# Patient Record
Sex: Female | Born: 1960 | Race: White | Hispanic: No | Marital: Married | Smoking: Never smoker
Health system: Southern US, Community
[De-identification: ages and names within clinical notes are randomized; demographics above are authoritative.]

## PROBLEM LIST (undated history)

## (undated) DIAGNOSIS — R002 Palpitations: Secondary | ICD-10-CM

## (undated) DIAGNOSIS — F419 Anxiety disorder, unspecified: Secondary | ICD-10-CM

## (undated) DIAGNOSIS — K219 Gastro-esophageal reflux disease without esophagitis: Secondary | ICD-10-CM

## (undated) DIAGNOSIS — M51369 Other intervertebral disc degeneration, lumbar region without mention of lumbar back pain or lower extremity pain: Secondary | ICD-10-CM

## (undated) DIAGNOSIS — M6530 Trigger finger, unspecified finger: Secondary | ICD-10-CM

## (undated) DIAGNOSIS — M5136 Other intervertebral disc degeneration, lumbar region: Secondary | ICD-10-CM

## (undated) DIAGNOSIS — G4730 Sleep apnea, unspecified: Secondary | ICD-10-CM

## (undated) DIAGNOSIS — E669 Obesity, unspecified: Secondary | ICD-10-CM

## (undated) DIAGNOSIS — E039 Hypothyroidism, unspecified: Secondary | ICD-10-CM

## (undated) DIAGNOSIS — F329 Major depressive disorder, single episode, unspecified: Secondary | ICD-10-CM

## (undated) DIAGNOSIS — R9431 Abnormal electrocardiogram [ECG] [EKG]: Secondary | ICD-10-CM

## (undated) DIAGNOSIS — F32A Depression, unspecified: Secondary | ICD-10-CM

## (undated) DIAGNOSIS — M5416 Radiculopathy, lumbar region: Secondary | ICD-10-CM

## (undated) DIAGNOSIS — Z9889 Other specified postprocedural states: Secondary | ICD-10-CM

## (undated) DIAGNOSIS — E785 Hyperlipidemia, unspecified: Secondary | ICD-10-CM

## (undated) DIAGNOSIS — K581 Irritable bowel syndrome with constipation: Secondary | ICD-10-CM

## (undated) HISTORY — PX: BREAST BIOPSY: SHX20

## (undated) HISTORY — DX: Hypothyroidism, unspecified: E03.9

## (undated) HISTORY — PX: COLONOSCOPY: SHX174

## (undated) HISTORY — DX: Other specified postprocedural states: Z98.890

## (undated) HISTORY — DX: Trigger finger, unspecified finger: M65.30

## (undated) HISTORY — DX: Depression, unspecified: F32.A

## (undated) HISTORY — PX: DILATION AND CURETTAGE OF UTERUS: SHX78

## (undated) HISTORY — PX: CHOLECYSTECTOMY: SHX55

## (undated) HISTORY — DX: Gastro-esophageal reflux disease without esophagitis: K21.9

## (undated) HISTORY — DX: Major depressive disorder, single episode, unspecified: F32.9

---
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
## 2004-12-25 ENCOUNTER — Ambulatory Visit: Payer: Self-pay | Admitting: Internal Medicine

## 2005-04-15 ENCOUNTER — Ambulatory Visit: Payer: Self-pay | Admitting: Internal Medicine

## 2006-04-22 ENCOUNTER — Ambulatory Visit: Payer: Self-pay | Admitting: Internal Medicine

## 2006-09-16 ENCOUNTER — Ambulatory Visit: Payer: Self-pay

## 2006-09-21 HISTORY — PX: LUMBAR DISC SURGERY: SHX700

## 2006-09-21 HISTORY — PX: BACK SURGERY: SHX140

## 2007-05-19 ENCOUNTER — Ambulatory Visit: Payer: Self-pay | Admitting: Unknown Physician Specialty

## 2007-09-22 HISTORY — PX: BLADDER SUSPENSION: SHX72

## 2007-09-29 ENCOUNTER — Ambulatory Visit: Payer: Self-pay | Admitting: Unknown Physician Specialty

## 2008-05-22 ENCOUNTER — Ambulatory Visit: Payer: Self-pay | Admitting: Unknown Physician Specialty

## 2008-07-10 ENCOUNTER — Ambulatory Visit: Payer: Self-pay | Admitting: Internal Medicine

## 2008-11-01 ENCOUNTER — Ambulatory Visit: Payer: Self-pay | Admitting: Otolaryngology

## 2009-06-18 ENCOUNTER — Ambulatory Visit: Payer: Self-pay | Admitting: Unknown Physician Specialty

## 2009-12-03 ENCOUNTER — Ambulatory Visit: Payer: Self-pay | Admitting: Internal Medicine

## 2010-08-06 ENCOUNTER — Ambulatory Visit: Payer: Self-pay | Admitting: Unknown Physician Specialty

## 2010-10-14 ENCOUNTER — Ambulatory Visit: Payer: Self-pay

## 2011-03-24 ENCOUNTER — Ambulatory Visit: Payer: Self-pay

## 2011-04-25 ENCOUNTER — Ambulatory Visit: Payer: Self-pay | Admitting: Unknown Physician Specialty

## 2011-09-02 ENCOUNTER — Ambulatory Visit: Payer: Self-pay | Admitting: Unknown Physician Specialty

## 2011-11-05 ENCOUNTER — Ambulatory Visit: Payer: Self-pay | Admitting: Otolaryngology

## 2011-11-05 LAB — PREGNANCY, URINE: Pregnancy Test, Urine: NEGATIVE m[IU]/mL

## 2011-11-10 LAB — PATHOLOGY REPORT

## 2011-11-11 LAB — WOUND CULTURE

## 2012-09-12 ENCOUNTER — Ambulatory Visit: Payer: Self-pay | Admitting: Physician Assistant

## 2012-09-21 HISTORY — PX: JOINT REPLACEMENT: SHX530

## 2012-09-21 HISTORY — PX: KNEE SURGERY: SHX244

## 2012-11-17 ENCOUNTER — Ambulatory Visit: Payer: Self-pay | Admitting: Unknown Physician Specialty

## 2012-12-22 ENCOUNTER — Encounter: Payer: Self-pay | Admitting: Unknown Physician Specialty

## 2013-01-19 ENCOUNTER — Encounter: Payer: Self-pay | Admitting: Unknown Physician Specialty

## 2013-06-06 ENCOUNTER — Ambulatory Visit: Payer: Self-pay | Admitting: Gastroenterology

## 2014-02-27 DIAGNOSIS — S8000XA Contusion of unspecified knee, initial encounter: Secondary | ICD-10-CM | POA: Insufficient documentation

## 2014-06-19 DIAGNOSIS — M5136 Other intervertebral disc degeneration, lumbar region: Secondary | ICD-10-CM | POA: Insufficient documentation

## 2014-09-21 HISTORY — PX: HIATAL HERNIA REPAIR: SHX195

## 2014-09-21 HISTORY — PX: HERNIA REPAIR: SHX51

## 2015-01-13 NOTE — Op Note (Signed)
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   POSTOPERATIVE DIAGNOSIS:  Right chronic frontal sinusitis status post two prior sinus surgeries.   PROCEDURES:  1. Image-guided sinus surgery (Stryker navigation).  2. Revision right frontal sinusotomy with tissue removal.   SURGEON: Janalee Dane, MD   DESCRIPTION OF PROCEDURE: The image-guided sinus surgery system mask was attached and registration was carried out in the standard fashion using the appropriate fiduciary points. Calibration of the system was confirmed and extensive review of the CT scan in all three dimensions preoperatively and intraoperatively was carried out.  Each instrument was registered and confirmed for anatomic accuracy.  The right frontal recess was contracted. Examination was carried out on the left side. This was wide open and in good condition. There was a small synechia at the anterior aspect of the left frontal recess. This was divided. Attention was directed to the right side where the right frontal recess was opened with multiple instrumentation. The frontal recess was completely opened and care was taken to avoid damage to the skull base, lamina papyracea, and fovea ethmoidalis. No CSF leak was identified. Less than 1 unit of Surgiflo was used and temporary Telfa pledgets were placed. The patient was returned to anesthesia, allowed to emerge from anesthesia in the operating room, and taken to the recovery room in stable condition. There were no complications. Estimated blood loss less than 10 mL.  ____________________________ J. Nadeen Landau, MD [id number] D: 11/05/2011 18:21:29 ET T: 11/06/2011 10:13:25 ET JOB#: [id number]  cc: Janalee Dane, MD, <Dictator> Nicholos Johns MD ELECTRONICALLY SIGNED 11/06/2011 11:24

## 2015-05-08 ENCOUNTER — Other Ambulatory Visit: Payer: Self-pay

## 2015-05-08 DIAGNOSIS — K2100 Gastro-esophageal reflux disease with esophagitis, without bleeding: Secondary | ICD-10-CM

## 2015-06-12 DIAGNOSIS — F419 Anxiety disorder, unspecified: Secondary | ICD-10-CM | POA: Insufficient documentation

## 2015-06-12 DIAGNOSIS — M2550 Pain in unspecified joint: Secondary | ICD-10-CM | POA: Insufficient documentation

## 2015-06-13 ENCOUNTER — Other Ambulatory Visit: Payer: Self-pay | Admitting: Bariatrics

## 2015-06-19 ENCOUNTER — Other Ambulatory Visit: Payer: Self-pay | Admitting: Bariatrics

## 2015-06-20 ENCOUNTER — Ambulatory Visit
Admission: RE | Admit: 2015-06-20 | Discharge: 2015-06-20 | Disposition: A | Discharge status: Home / Self Care | Payer: BC Managed Care – PPO | Source: Ambulatory Visit | Attending: Bariatrics | Admitting: Bariatrics

## 2015-06-20 DIAGNOSIS — K219 Gastro-esophageal reflux disease without esophagitis: Secondary | ICD-10-CM | POA: Insufficient documentation

## 2015-06-20 DIAGNOSIS — K449 Diaphragmatic hernia without obstruction or gangrene: Secondary | ICD-10-CM | POA: Insufficient documentation

## 2015-06-20 DIAGNOSIS — Z01818 Encounter for other preprocedural examination: Secondary | ICD-10-CM

## 2015-06-20 LAB — CBC
HEMATOCRIT: 40.8 % (ref 35.0–47.0)
HEMOGLOBIN: 13.8 g/dL (ref 12.0–16.0)
MCH: 31.6 pg (ref 26.0–34.0)
MCHC: 33.8 g/dL (ref 32.0–36.0)
MCV: 93.5 fL (ref 80.0–100.0)
Platelets: 265 10*3/uL (ref 150–440)
RBC: 4.36 MIL/uL (ref 3.80–5.20)
RDW: 13 % (ref 11.5–14.5)
WBC: 4.7 10*3/uL (ref 3.6–11.0)

## 2015-06-20 LAB — TSH: TSH: 1.87 u[IU]/mL (ref 0.350–4.500)

## 2015-06-20 LAB — VITAMIN B12: VITAMIN B 12: 475 pg/mL (ref 180–914)

## 2015-06-20 LAB — PREALBUMIN: PREALBUMIN: 21.7 mg/dL (ref 18–38)

## 2015-06-20 LAB — DIFFERENTIAL
BASOS ABS: 0 10*3/uL (ref 0–0.1)
Basophils Relative: 1 %
EOS PCT: 1 %
Eosinophils Absolute: 0 10*3/uL (ref 0–0.7)
LYMPHS PCT: 39 %
Lymphs Abs: 1.8 10*3/uL (ref 1.0–3.6)
Monocytes Absolute: 0.3 10*3/uL (ref 0.2–0.9)
Monocytes Relative: 7 %
NEUTROS ABS: 2.5 10*3/uL (ref 1.4–6.5)
NEUTROS PCT: 52 %

## 2015-06-20 LAB — COMPREHENSIVE METABOLIC PANEL
ALBUMIN: 3.8 g/dL (ref 3.5–5.0)
ALT: 14 U/L (ref 14–54)
ANION GAP: 8 (ref 5–15)
AST: 24 U/L (ref 15–41)
Alkaline Phosphatase: 51 U/L (ref 38–126)
BUN: 14 mg/dL (ref 6–20)
CHLORIDE: 104 mmol/L (ref 101–111)
CO2: 24 mmol/L (ref 22–32)
Calcium: 8.8 mg/dL — ABNORMAL LOW (ref 8.9–10.3)
Creatinine, Ser: 1 mg/dL (ref 0.44–1.00)
GFR calc Af Amer: >60 mL/min (ref >60)
GFR calc non Af Amer: >60 mL/min (ref >60)
GLUCOSE: 94 mg/dL (ref 65–99)
POTASSIUM: 4.1 mmol/L (ref 3.5–5.1)
SODIUM: 136 mmol/L (ref 135–145)
Total Bilirubin: 0.7 mg/dL (ref 0.3–1.2)
Total Protein: 7.2 g/dL (ref 6.5–8.1)

## 2015-06-20 LAB — HEMOGLOBIN A1C: Hgb A1c MFr Bld: 5.5 % (ref 4.0–6.0)

## 2015-06-20 LAB — PROTIME-INR
INR: 1.03
PROTHROMBIN TIME: 13.7 s (ref 11.4–15.0)

## 2015-06-20 LAB — MAGNESIUM: Magnesium: 2.1 mg/dL (ref 1.7–2.4)

## 2015-06-20 LAB — FERRITIN: Ferritin: 42 ng/mL (ref 11–307)

## 2015-06-20 LAB — IRON: IRON: 71 ug/dL (ref 28–170)

## 2015-06-20 LAB — APTT: aPTT: 30 seconds (ref 24–36)

## 2015-06-20 LAB — FOLATE: FOLATE: 18.6 ng/mL (ref >5.9)

## 2015-06-21 ENCOUNTER — Ambulatory Visit: Payer: BC Managed Care – PPO | Attending: Neurology

## 2015-06-21 DIAGNOSIS — G4733 Obstructive sleep apnea (adult) (pediatric): Secondary | ICD-10-CM | POA: Insufficient documentation

## 2015-06-21 DIAGNOSIS — G4761 Periodic limb movement disorder: Secondary | ICD-10-CM | POA: Insufficient documentation

## 2015-06-21 LAB — H. PYLORI ANTIBODY, IGG

## 2015-06-22 LAB — PTH, INTACT AND CALCIUM
CALCIUM TOTAL (PTH): 9.2 mg/dL (ref 8.7–10.2)
PTH: 28 pg/mL (ref 15–65)

## 2015-06-22 LAB — VITAMIN D 25 HYDROXY (VIT D DEFICIENCY, FRACTURES): Vit D, 25-Hydroxy: 30.9 ng/mL (ref 30.0–100.0)

## 2015-06-22 LAB — VITAMIN B1: Vitamin B1 (Thiamine): 137.3 nmol/L (ref 66.5–200.0)

## 2015-06-22 LAB — COPPER, SERUM: Copper: 127 ug/dL (ref 72–166)

## 2015-06-22 LAB — VITAMIN A: Vitamin A (Retinoic Acid): 56 ug/dL (ref 20–65)

## 2015-06-22 LAB — ZINC: ZINC: 74 ug/dL (ref 56–134)

## 2015-06-22 LAB — VITAMIN E: ALPHA-TOCOPHEROL: 11.9 mg/L (ref 5.3–16.8)

## 2015-06-23 LAB — MISC LABCORP TEST (SEND OUT): LABCORP TEST CODE: 121200

## 2015-07-03 ENCOUNTER — Ambulatory Visit: Payer: BC Managed Care – PPO | Attending: Neurology

## 2015-07-03 DIAGNOSIS — G4733 Obstructive sleep apnea (adult) (pediatric): Secondary | ICD-10-CM | POA: Insufficient documentation

## 2015-07-15 DIAGNOSIS — R9431 Abnormal electrocardiogram [ECG] [EKG]: Secondary | ICD-10-CM | POA: Insufficient documentation

## 2015-07-29 DIAGNOSIS — R0602 Shortness of breath: Secondary | ICD-10-CM | POA: Insufficient documentation

## 2015-07-30 ENCOUNTER — Other Ambulatory Visit: Payer: Self-pay | Admitting: Physical Medicine and Rehabilitation

## 2015-07-30 ENCOUNTER — Ambulatory Visit
Admission: RE | Admit: 2015-07-30 | Discharge: 2015-07-30 | Disposition: A | Discharge status: Home / Self Care | Payer: BC Managed Care – PPO | Source: Ambulatory Visit | Attending: Physical Medicine and Rehabilitation | Admitting: Physical Medicine and Rehabilitation

## 2015-07-30 DIAGNOSIS — M4806 Spinal stenosis, lumbar region: Secondary | ICD-10-CM | POA: Insufficient documentation

## 2015-07-30 DIAGNOSIS — R39198 Other difficulties with micturition: Secondary | ICD-10-CM

## 2015-07-30 DIAGNOSIS — M47896 Other spondylosis, lumbar region: Secondary | ICD-10-CM | POA: Diagnosis not present

## 2015-07-30 DIAGNOSIS — M5127 Other intervertebral disc displacement, lumbosacral region: Secondary | ICD-10-CM | POA: Diagnosis not present

## 2015-07-30 DIAGNOSIS — M5416 Radiculopathy, lumbar region: Secondary | ICD-10-CM

## 2015-07-30 DIAGNOSIS — M5186 Other intervertebral disc disorders, lumbar region: Secondary | ICD-10-CM | POA: Insufficient documentation

## 2015-08-12 DIAGNOSIS — M5416 Radiculopathy, lumbar region: Secondary | ICD-10-CM | POA: Insufficient documentation

## 2015-08-23 HISTORY — PX: GASTRIC RESTRICTION SURGERY: SHX653

## 2015-10-04 ENCOUNTER — Other Ambulatory Visit: Payer: Self-pay | Admitting: Unknown Physician Specialty

## 2015-10-04 ENCOUNTER — Other Ambulatory Visit: Payer: Self-pay | Admitting: *Deleted

## 2015-10-04 ENCOUNTER — Inpatient Hospital Stay
Admission: RE | Admit: 2015-10-04 | Discharge: 2015-10-04 | Disposition: A | Discharge status: Home / Self Care | Payer: Self-pay | Source: Ambulatory Visit | Attending: *Deleted | Admitting: *Deleted

## 2015-10-04 DIAGNOSIS — Z9289 Personal history of other medical treatment: Secondary | ICD-10-CM

## 2015-10-04 DIAGNOSIS — R928 Other abnormal and inconclusive findings on diagnostic imaging of breast: Secondary | ICD-10-CM

## 2015-10-17 ENCOUNTER — Ambulatory Visit
Admission: RE | Admit: 2015-10-17 | Discharge: 2015-10-17 | Disposition: A | Discharge status: Home / Self Care | Payer: BC Managed Care – PPO | Source: Ambulatory Visit | Attending: Unknown Physician Specialty | Admitting: Unknown Physician Specialty

## 2015-10-17 ENCOUNTER — Other Ambulatory Visit: Payer: Self-pay | Admitting: Unknown Physician Specialty

## 2015-10-17 DIAGNOSIS — R928 Other abnormal and inconclusive findings on diagnostic imaging of breast: Secondary | ICD-10-CM

## 2015-10-17 DIAGNOSIS — R590 Localized enlarged lymph nodes: Secondary | ICD-10-CM | POA: Diagnosis not present

## 2015-10-25 ENCOUNTER — Encounter: Payer: Self-pay | Admitting: General Surgery

## 2015-10-28 ENCOUNTER — Ambulatory Visit: Payer: BC Managed Care – PPO

## 2015-10-28 ENCOUNTER — Ambulatory Visit (INDEPENDENT_AMBULATORY_CARE_PROVIDER_SITE_OTHER): Payer: BC Managed Care – PPO | Admitting: General Surgery

## 2015-10-28 ENCOUNTER — Other Ambulatory Visit: Payer: BC Managed Care – PPO

## 2015-10-28 ENCOUNTER — Other Ambulatory Visit: Payer: Self-pay | Admitting: Unknown Physician Specialty

## 2015-10-28 ENCOUNTER — Encounter: Payer: Self-pay | Admitting: General Surgery

## 2015-10-28 VITALS — BP 128/82 | HR 76 | Resp 12 | Ht 64.0 in | Wt 248.0 lb

## 2015-10-28 DIAGNOSIS — N6320 Unspecified lump in the left breast, unspecified quadrant: Secondary | ICD-10-CM

## 2015-10-28 DIAGNOSIS — N63 Unspecified lump in breast: Secondary | ICD-10-CM | POA: Diagnosis not present

## 2015-10-28 DIAGNOSIS — R590 Localized enlarged lymph nodes: Secondary | ICD-10-CM | POA: Diagnosis not present

## 2015-10-28 DIAGNOSIS — R928 Other abnormal and inconclusive findings on diagnostic imaging of breast: Secondary | ICD-10-CM

## 2015-10-28 DIAGNOSIS — N6310 Unspecified lump in the right breast, unspecified quadrant: Secondary | ICD-10-CM

## 2015-10-28 HISTORY — PX: BREAST BIOPSY: SHX20

## 2015-10-28 NOTE — Progress Notes (Signed)
Patient ID: Sherry Evans, female   DOB: [birth year], 55 y.o.   MRN: [id number]  Chief Complaint  Patient presents with  . OTHER    mammogram    HPI Sherry Evans is a 55 y.o. female. who presents for a breast evaluation. The most recent mammogram was done on 10/04/15 and added views left breast ultrasound on 10/17/15.  The patient denies any history of trauma or any breast discomfort. Patient does not  perform regular self breast checks and gets regular mammograms done. Family history of breast cancer.  50 pound weight loss since bariatric surgery completed in December 2016.  She is a Oncologist.  HPI  Past Medical History  Diagnosis Date  . Hypothyroidism   . History of total bilateral knee replacement   . History of sinus surgery     3 times  . GERD (gastroesophageal reflux disease)   . Depression     Past Surgical History  Procedure Laterality Date  . Bladder suspension  2009  . Back surgery  2008  . Knee surgery  2014  . Cholecystectomy    . Gastric restriction surgery  08/23/15  . Hernia repair  2016    hiatal  . Adnoids removed  1969  . Breast biopsy Left 1990's    benign    Family History  Problem Relation Age of Onset  . Breast cancer Maternal Aunt     great aunts. 70-80  . Breast cancer Maternal Grandmother 9    metatastic to breast  . Breast cancer Paternal Grandmother     70's    Social History Social History  Substance Use Topics  . Smoking status: Never Smoker   . Smokeless tobacco: None  . Alcohol Use: No    Allergies  Allergen Reactions  . Azithromycin Nausea And Vomiting  . Sulfa Antibiotics Rash    Current Outpatient Prescriptions  Medication Sig Dispense Refill  . Calcium Carbonate-Vitamin D 600-400 MG-UNIT per tablet Take by mouth. Reported on 10/28/2015    . docusate (COLACE) 50 MG/5ML liquid Take by mouth.    . gabapentin (NEURONTIN) 600 MG tablet TAKE 2 TABLETS BY MOUTH WITH EVENING MEAL    . levothyroxine (SYNTHROID,  LEVOTHROID) 100 MCG tablet Take by mouth.    . montelukast (SINGULAIR) 10 MG tablet Take by mouth.    . RABEprazole (ACIPHEX) 20 MG tablet Take 20 mg by mouth 2 times daily at 12 noon and 4 pm.    . sertraline (ZOLOFT) 25 MG tablet Take by mouth.     No current facility-administered medications for this visit.    Review of Systems Review of Systems  Constitutional: Negative.   Respiratory: Negative.   Cardiovascular: Negative.     Blood pressure 128/82, pulse 76, resp. rate 12, height 5\' 4"  (1.626 m), weight 248 lb (112.492 kg), last menstrual period 10/13/2015.  Physical Exam Physical Exam  Constitutional: She is oriented to person, place, and time. She appears well-developed and well-nourished.  Eyes: Conjunctivae are normal. No scleral icterus.  Neck: Neck supple.  Cardiovascular: Normal rate, regular rhythm and normal heart sounds.   Pulmonary/Chest: Effort normal and breath sounds normal. Right breast exhibits no inverted nipple, no mass, no nipple discharge, no skin change and no tenderness. Left breast exhibits no inverted nipple, no nipple discharge, no skin change and no tenderness.    Abdominal: Soft. Normal appearance and bowel sounds are normal. There is no hepatomegaly. There is no tenderness.  Lymphadenopathy:    She  has no cervical adenopathy.    She has no axillary adenopathy.       Right: No inguinal adenopathy present.       Left: No inguinal adenopathy present.  Neurological: She is alert and oriented to person, place, and time.  Skin: Skin is warm and dry.    Data Reviewed Mammograms completed through [facility] from December 2014 to January 2017 were reviewed. A small nodules identified high in the upper-outer quadrant of the left breast that has gradually increased in size from 8 x 8 cm in 2014-10 x 11 cm in 2017. BI-RADS-0.  Diagnostic mammogram dated 10/17/2015 showed a persistent mass in the upper-outer quadrant of the left breast. Ultrasound  showed a 6 x 8 x 10 cm in tremor mammary lymph node. Multiple enlarged lymph nodes in the axilla were reported. BI-RADS-4.  10/03/2015 GYN note from Bunnie Pion, M.D. reviewed.  08/24/2000 stereotactic biopsy of the left breast showed fibrocystic changes.  Ultrasound examination of the upper-outer quadrant of the right breast showed a chain is small isoechoic nodules consistent with fat lobules measuring up to 0.67 cm in diameter. No architectural distortion. BI-RADS-2.  Ultrasound examination of the left breast showed a well-defined nodule in the 2:00 position, 14 cm from the nipple. This measured 0.8 x 0.9 x 0.9 cm. Some posterior acoustic enhancement was noted. In the axilla a moderately large lymph node measuring 0.8 x 1.4 x 2.2 cm was appreciated.  The patient was amenable to core biopsy and FNA sampling. The axillary node was sampled with a 22-gauge needle with multiple passes through the lesion in multiple areas with slides prepared for cytology.  The breast nodule was approached laterally after the instillation of 10 mL of 0.5% Xylocaine with 0.25% Marcaine with 1-200,000 of epinephrine. A 10-gauge Encor biopsy device was used and the area was removed in its entirety. Post biopsy a clip was placed without incident. A roll was placed behind the left shoulder to provide better access to the axilla(designated post roll on images). Skin defect was closed with benzoin and Steri-Strips followed by Telfa and Tegaderm dressing.    Assessment    Enlarging intramammary node/mass left breast area moderate lymphadenopathy without clear etiology.    Plan    The patient tolerated the procedure well. She'll be contacted when pathology results are available.    PCP:  Radford Pax This information has been scribed by Gaspar Cola CMA.    Robert Bellow 10/29/2015, 5:03 PM

## 2015-10-28 NOTE — Patient Instructions (Signed)

## 2015-10-29 ENCOUNTER — Other Ambulatory Visit: Payer: BC Managed Care – PPO

## 2015-10-29 DIAGNOSIS — R590 Localized enlarged lymph nodes: Secondary | ICD-10-CM | POA: Insufficient documentation

## 2015-10-29 DIAGNOSIS — N6320 Unspecified lump in the left breast, unspecified quadrant: Secondary | ICD-10-CM | POA: Insufficient documentation

## 2015-10-29 DIAGNOSIS — N6310 Unspecified lump in the right breast, unspecified quadrant: Secondary | ICD-10-CM | POA: Insufficient documentation

## 2015-10-30 ENCOUNTER — Telehealth: Payer: Self-pay | Admitting: General Surgery

## 2015-10-30 ENCOUNTER — Other Ambulatory Visit: Payer: Self-pay

## 2015-10-30 DIAGNOSIS — D479 Neoplasm of uncertain behavior of lymphoid, hematopoietic and related tissue, unspecified: Secondary | ICD-10-CM

## 2015-10-30 NOTE — Telephone Encounter (Signed)
The patient was notified that the biopsy of the left intramammary lymph node completed on February 6 did not show evidence of malignancy. No breast pathology whatsoever. Possibly a component of mild acute inflammation. FNA of the prominent axillary lymph node was negative for metastatic disease.  The pathologist suggested that evaluation by hematology for lymphoproliferative diseases may be appropriate. This information was passed out of the patient.  We'll arrange an appointment with Lorenda Cahill, M.D. from hematology/oncology for her assessment.

## 2015-10-30 NOTE — Progress Notes (Signed)
Patient is scheduled to see Dr Mike Gip at [facility] on 11/08/15 at 8:30 am. Patient is aware of date, time, and instructions.

## 2015-11-05 ENCOUNTER — Telehealth: Payer: Self-pay | Admitting: *Deleted

## 2015-11-05 NOTE — Telephone Encounter (Signed)
-----   Message from Carson Myrtle, RN sent at 10/29/2015  8:20 AM EST ----- Phone follow up

## 2015-11-05 NOTE — Telephone Encounter (Signed)
Minimal bruising. Continue self breast exams. Call office for any new breast issues or concerns.

## 2015-11-05 NOTE — Telephone Encounter (Signed)
Patient states the area is clean and steri stirps have fell off.

## 2015-11-08 ENCOUNTER — Inpatient Hospital Stay: Payer: BC Managed Care – PPO

## 2015-11-08 ENCOUNTER — Inpatient Hospital Stay: Payer: BC Managed Care – PPO | Attending: Hematology and Oncology | Admitting: Hematology and Oncology

## 2015-11-08 VITALS — BP 121/87 | HR 89 | Temp 97.1°F | Wt 246.9 lb

## 2015-11-08 DIAGNOSIS — N63 Unspecified lump in breast: Secondary | ICD-10-CM | POA: Insufficient documentation

## 2015-11-08 DIAGNOSIS — K219 Gastro-esophageal reflux disease without esophagitis: Secondary | ICD-10-CM | POA: Diagnosis not present

## 2015-11-08 DIAGNOSIS — E039 Hypothyroidism, unspecified: Secondary | ICD-10-CM | POA: Diagnosis not present

## 2015-11-08 DIAGNOSIS — N926 Irregular menstruation, unspecified: Secondary | ICD-10-CM | POA: Insufficient documentation

## 2015-11-08 DIAGNOSIS — Z9889 Other specified postprocedural states: Secondary | ICD-10-CM | POA: Insufficient documentation

## 2015-11-08 DIAGNOSIS — R61 Generalized hyperhidrosis: Secondary | ICD-10-CM

## 2015-11-08 DIAGNOSIS — Z803 Family history of malignant neoplasm of breast: Secondary | ICD-10-CM | POA: Insufficient documentation

## 2015-11-08 DIAGNOSIS — Z79899 Other long term (current) drug therapy: Secondary | ICD-10-CM | POA: Diagnosis not present

## 2015-11-08 DIAGNOSIS — Z9884 Bariatric surgery status: Secondary | ICD-10-CM | POA: Diagnosis not present

## 2015-11-08 DIAGNOSIS — R634 Abnormal weight loss: Secondary | ICD-10-CM

## 2015-11-08 DIAGNOSIS — R590 Localized enlarged lymph nodes: Secondary | ICD-10-CM

## 2015-11-08 LAB — CBC WITH DIFFERENTIAL/PLATELET
Basophils Absolute: 0.1 10*3/uL (ref 0–0.1)
Basophils Relative: 1 %
Eosinophils Absolute: 0.1 10*3/uL (ref 0–0.7)
Eosinophils Relative: 1 %
HCT: 41.7 % (ref 35.0–47.0)
Hemoglobin: 14.1 g/dL (ref 12.0–16.0)
Lymphocytes Relative: 27 %
Lymphs Abs: 1.6 10*3/uL (ref 1.0–3.6)
MCH: 32 pg (ref 26.0–34.0)
MCHC: 33.8 g/dL (ref 32.0–36.0)
MCV: 94.6 fL (ref 80.0–100.0)
Monocytes Absolute: 0.5 10*3/uL (ref 0.2–0.9)
Monocytes Relative: 9 %
Neutro Abs: 3.6 10*3/uL (ref 1.4–6.5)
Neutrophils Relative %: 62 %
Platelets: 195 10*3/uL (ref 150–440)
RBC: 4.41 MIL/uL (ref 3.80–5.20)
RDW: 15.2 % — ABNORMAL HIGH (ref 11.5–14.5)
WBC: 5.8 10*3/uL (ref 3.6–11.0)

## 2015-11-08 LAB — COMPREHENSIVE METABOLIC PANEL
ALT: 21 U/L (ref 14–54)
AST: 23 U/L (ref 15–41)
Albumin: 4.1 g/dL (ref 3.5–5.0)
Alkaline Phosphatase: 48 U/L (ref 38–126)
Anion gap: 4 — ABNORMAL LOW (ref 5–15)
BUN: 19 mg/dL (ref 6–20)
CO2: 26 mmol/L (ref 22–32)
Calcium: 9.2 mg/dL (ref 8.9–10.3)
Chloride: 107 mmol/L (ref 101–111)
Creatinine, Ser: 0.83 mg/dL (ref 0.44–1.00)
GFR calc Af Amer: >60 mL/min (ref >60)
GFR calc non Af Amer: >60 mL/min (ref >60)
Glucose, Bld: 108 mg/dL — ABNORMAL HIGH (ref 65–99)
Potassium: 3.7 mmol/L (ref 3.5–5.1)
Sodium: 137 mmol/L (ref 135–145)
Total Bilirubin: 0.6 mg/dL (ref 0.3–1.2)
Total Protein: 7.6 g/dL (ref 6.5–8.1)

## 2015-11-08 LAB — URIC ACID: Uric Acid, Serum: 3.5 mg/dL (ref 2.3–6.6)

## 2015-11-08 LAB — LACTATE DEHYDROGENASE: LDH: 130 U/L (ref 98–192)

## 2015-11-08 NOTE — Progress Notes (Addendum)
[facility] day:  11/08/2015  Chief Complaint: Sherry Evans is a 55 y.o. female with lymphadenopathy who is referred in consultation by Dr. Bary Castilla.  HPI: The patient denies any history of significant infections, bites, or scratches.  She has not been aware of any adenopathy.  She describes hot flashes and some sweats at night for greater than a year.  She is still having menstrual periods, although sometimes she has 2 periods a month; menses are longer and heavier (? peri-menopausal).  She states that she underwent pelvic ultrasound and endometrial biopsy in 09/2015.  Pathology was negative per patient report.  She notes a 55 pound weight loss since bariatric surgery in 09/12/2015.  She was referred to Dr. Bary Castilla on 10/28/2015 for breast evaluation.  Stereotactic biopsy of the left breast on 08/24/2000 showed fibrocystic changes.  Mammograms at [place] from 08/2013 - 09/2015  Revealed small nodules identified high in the upper-outer quadrant of the left breast that have gradually increased in size from 8 x 8 mm in 2014 to 10 x 11 mm in 2017.  Diagnostic mammogram on 10/17/2015 revealed a persistent mass in the upper-outer quadrant of the left breast. Ultrasound noted a 6 x 8 x 10 mm intramammary lymph node.  Multiple enlarged lymph nodes in the axilla were reported.   Ultrasound examination of the upper-outer quadrant of the right breast showed a chain of small isoechoic nodules consistent with fat lobules measuring up to 6.7 mm in diameter.   There was no associated architectural distortion.   Ultrasound examination of the left breast showed a well-defined nodule in the 2:00 position, 14 cm from the nipple.  Nodule measured 0.8 x 0.9 x 0.9 cm. There was some posterior acoustic enhancement. In the axilla there was a moderately enlarged lymph node measuring 0.8 x 1.4 x 2.2 cm.  She underwent core biopsy and FNA sampling of a left axillary node on  10/28/2015.  Pathology from [facility] revealed small fragments of lymphoid tissue.    Immunohistochemistry for CD3, CD5, CD20, CD79a, and CD10 revealed no clonal population with a mixture of  T and B cells. There was no expression of CD5 in the B-cell areas and CD10 was negative. Findings were limited but favored a reactive process. If there was significant regional adenopathy, recommendation was for an excisional biopsy with further workup and flow cytometry.  She has a family history of breast cancer.  My Risk testing on 08/31/2013 revealed no clinically significant mutation.  She denies any history of hepatitis.  She denies any risk factors for HIV disease.  She denies any history of autoimmune disease.   Past Medical History  Diagnosis Date  . Hypothyroidism   . History of total bilateral knee replacement   . History of sinus surgery     3 times  . GERD (gastroesophageal reflux disease)   . Depression     Past Surgical History  Procedure Laterality Date  . Bladder suspension  2009  . Back surgery  2008  . Knee surgery  2014  . Cholecystectomy    . Gastric restriction surgery  08/23/15  . Hernia repair  2016    hiatal  . Adnoids removed  1969  . Breast biopsy Left 1990's    benign    Family History  Problem Relation Age of Onset  . Breast cancer Maternal Aunt     great aunts. 70-80  . Breast cancer Maternal Grandmother 25  metatastic to breast  . Breast cancer Paternal Grandmother     70's    Social History:  reports that she has never smoked. She does not have any smokeless tobacco history on file. She reports that she does not drink alcohol or use illicit drugs.  She is a Oncologist.  She is accompanied by her husband, Liliane Channel, today.  Allergies:  Allergies  Allergen Reactions  . Azithromycin Nausea And Vomiting  . Sulfa Antibiotics Rash    Current Medications: Current Outpatient Prescriptions  Medication Sig Dispense Refill  . amoxicillin  (AMOXIL) 500 MG capsule Take 500 mg by mouth as needed (for dental appointments).    Marland Kitchen azelastine (ASTELIN) 0.1 % nasal spray Place 2 sprays into both nostrils 2 (two) times daily. Use in each nostril as directed    . budesonide (PULMICORT) 0.5 MG/2ML nebulizer solution Take 0.5 mg by nebulization 2 (two) times daily as needed.    . Calcium Carbonate-Vitamin D 600-400 MG-UNIT per tablet Take by mouth. Reported on 10/28/2015    . cetirizine (ZYRTEC) 10 MG tablet Take 10 mg by mouth daily.    . chlorhexidine (PERIDEX) 0.12 % solution Use as directed 15 mLs in the mouth or throat 2 (two) times daily.    . diclofenac sodium (VOLTAREN) 1 % GEL Apply topically 4 (four) times daily.    Marland Kitchen docusate (COLACE) 50 MG/5ML liquid Take by mouth.    . gabapentin (NEURONTIN) 600 MG tablet TAKE 2 TABLETS BY MOUTH WITH EVENING MEAL    . levothyroxine (SYNTHROID, LEVOTHROID) 100 MCG tablet Take by mouth.    . montelukast (SINGULAIR) 10 MG tablet Take by mouth.    . Multiple Vitamins-Minerals (ADVANCED MULTI EA PO) Take 2 tablets by mouth daily.    . ondansetron (ZOFRAN-ODT) 4 MG disintegrating tablet Take 4 mg by mouth every 8 (eight) hours as needed for nausea or vomiting.    . RABEprazole (ACIPHEX) 20 MG tablet Take 20 mg by mouth 2 times daily at 12 noon and 4 pm.    . sertraline (ZOLOFT) 25 MG tablet Take 50 mg by mouth.     . traZODone (DESYREL) 50 MG tablet Take 50 mg by mouth at bedtime as needed for sleep.     No current facility-administered medications for this visit.    Review of Systems:  GENERAL:  Feels good.  Active.  No fevers.  Intermittent night sweats. Weight loss of 55 pounds since bariatric surgery 2 month ago. PERFORMANCE STATUS (ECOG):  0 HEENT:  No visual changes, runny nose, sore throat, mouth sores or tenderness. Lungs: No shortness of breath or cough.  No hemoptysis. Cardiac:  No chest pain, palpitations, orthopnea, or PND. GI:  No nausea, vomiting, diarrhea, constipation, melena or  hematochezia.  Colonoscopy 5-6 years ago was "ok". GU:  No urgency, frequency, dysuria, or hematuria.  Somewhat erratic menses. Musculoskeletal:  No back pain.  No joint pain.  No muscle tenderness. Extremities:  No pain or swelling. Skin:  No rashes or skin changes. Neuro:  No headache, numbness or weakness, balance or coordination issues. Endocrine:  No diabetes.  Thyroid disease on Synthroid.  Some hot flashes and night sweats for > 1 year. Psych:  No mood changes, depression or anxiety. Pain:  No focal pain. Review of systems:  All other systems reviewed and found to be negative.  Physical Exam: Blood pressure 121/87, pulse 89, temperature 97.1 F (36.2 C), temperature source Tympanic, weight 246 lb 14.6 oz (112 kg), last menstrual  period 10/13/2015. GENERAL:  Well developed, well nourished, sitting comfortably in the exam room in no acute distress. MENTAL STATUS:  Alert and oriented to person, place and time. HEAD:  Short styled brown hair.  Normocephalic, atraumatic, face symmetric, no Cushingoid features. EYES:  Glasses.  Blue eyes.  Pupils equal round and reactive to light and accomodation.  No conjunctivitis or scleral icterus. ENT:  Oropharynx clear without lesion.  Tongue normal. Mucous membranes moist.  RESPIRATORY:  Clear to auscultation without rales, wheezes or rhonchi. CARDIOVASCULAR:  Regular rate and rhythm without murmur, rub or gallop. ABDOMEN:  Soft, non-tender, with active bowel sounds, and no appreciable hepatosplenomegaly.  No masses. SKIN:  No rashes, ulcers or lesions. EXTREMITIES: No edema, no skin discoloration or tenderness.  No palpable cords. LYMPH NODES: 2 cm mobile left axillary lymph node.  Tiny right axillary lymph node.  No palpable cervical, supraclavicular, or inguinal adenopathy  NEUROLOGICAL: Unremarkable. PSYCH:  Appropriate.  Appointment on 11/08/2015  Component Date Value Ref Range Status  . WBC 11/08/2015 5.8  3.6 - 11.0 K/uL Final  . RBC  11/08/2015 4.41  3.80 - 5.20 MIL/uL Final  . Hemoglobin 11/08/2015 14.1  12.0 - 16.0 g/dL Final  . HCT 11/08/2015 41.7  35.0 - 47.0 % Final  . MCV 11/08/2015 94.6  80.0 - 100.0 fL Final  . MCH 11/08/2015 32.0  26.0 - 34.0 pg Final  . MCHC 11/08/2015 33.8  32.0 - 36.0 g/dL Final  . RDW 11/08/2015 15.2* 11.5 - 14.5 % Final  . Platelets 11/08/2015 195  150 - 440 K/uL Final  . Neutrophils Relative % 11/08/2015 62   Final  . Neutro Abs 11/08/2015 3.6  1.4 - 6.5 K/uL Final  . Lymphocytes Relative 11/08/2015 27   Final  . Lymphs Abs 11/08/2015 1.6  1.0 - 3.6 K/uL Final  . Monocytes Relative 11/08/2015 9   Final  . Monocytes Absolute 11/08/2015 0.5  0.2 - 0.9 K/uL Final  . Eosinophils Relative 11/08/2015 1   Final  . Eosinophils Absolute 11/08/2015 0.1  0 - 0.7 K/uL Final  . Basophils Relative 11/08/2015 1   Final  . Basophils Absolute 11/08/2015 0.1  0 - 0.1 K/uL Final  . Sodium 11/08/2015 137  135 - 145 mmol/L Final  . Potassium 11/08/2015 3.7  3.5 - 5.1 mmol/L Final  . Chloride 11/08/2015 107  101 - 111 mmol/L Final  . CO2 11/08/2015 26  22 - 32 mmol/L Final  . Glucose, Bld 11/08/2015 108* 65 - 99 mg/dL Final  . BUN 11/08/2015 19  6 - 20 mg/dL Final  . Creatinine, Ser 11/08/2015 0.83  0.44 - 1.00 mg/dL Final  . Calcium 11/08/2015 9.2  8.9 - 10.3 mg/dL Final  . Total Protein 11/08/2015 7.6  6.5 - 8.1 g/dL Final  . Albumin 11/08/2015 4.1  3.5 - 5.0 g/dL Final  . AST 11/08/2015 23  15 - 41 U/L Final  . ALT 11/08/2015 21  14 - 54 U/L Final  . Alkaline Phosphatase 11/08/2015 48  38 - 126 U/L Final  . Total Bilirubin 11/08/2015 0.6  0.3 - 1.2 mg/dL Final  . GFR calc non Af Amer 11/08/2015 >60  >60 mL/min Final  . GFR calc Af Amer 11/08/2015 >60  >60 mL/min Final   Comment: (NOTE) The eGFR has been calculated using the CKD EPI equation. This calculation has not been validated in all clinical situations. eGFR's persistently <60 mL/min signify possible Chronic Kidney Disease.   . Anion gap  11/08/2015 4* 5 - 15 Final  . LDH 11/08/2015 130  98 - 192 U/L Final  . Uric Acid, Serum 11/08/2015 3.5  2.3 - 6.6 mg/dL Final    Assessment:  KAYSI OURADA is a 55 y.o. female with axillary adenopathy.  She has lost 55 pounds since gastric surgery on 12/22/016.  She has intermittent night sweats.  She is still having menses.    Diagnostic mammogram on 10/17/2015 revealed a persistent mass in the upper-outer quadrant of the left breast. Ultrasound noted a 6 x 8 x 10 mm intramammary lymph node.  Multiple enlarged lymph nodes in the axilla were reported.   Ultrasound examination of the left breast showed a well-defined nodule in the 2:00 position, 14 cm from the nipple.  Nodule measured 0.8 x 0.9 x 0.9 cm. There was some posterior acoustic enhancement. In the axilla there was a moderately enlarged lymph node measuring 0.8 x 1.4 x 2.2 cm.  She has a family history of breast cancer.  My Risk testing on 08/31/2013 revealed no clinically significant mutation.  Core biopsy and FNA sampling of a left axillary node on 10/28/2015 revealed small fragments of lymphoid tissue.   Flow cytometry revealed no evidence of a clonal population.  She denies any known infections.  She has not had any bites or scratches.  Exam reveals a tiny right axillary and 2 cm left axillary lymph node.   Plan: 1. Discuss pathology and likely reactive adenopathy.  Weight loss may be due to gastric surgery and sweats may be perimenopausal.  Discuss obtaining a chest CT scan to fully assess any adenopathy.  If any concerning lymph node, would consider excisional biopsy.  If work-up negative would follow to ensure resolution of adenopathy. 2. Labs today:  CBC with diff, CMP, LDH, uric acid, hepatitis B core antibody, hepatitis C antibody, HIV (patient consented), ANA, ACE level, FSH, estradiol. 3. Chest CT without contrast- assess adenopathy. 4. RTC in 1 week for MD assessment and review of work-up.   Lequita Asal, MD   11/08/2015, 8:21 AM

## 2015-11-09 ENCOUNTER — Encounter: Payer: Self-pay | Admitting: Hematology and Oncology

## 2015-11-10 ENCOUNTER — Telehealth: Payer: Self-pay | Admitting: General Surgery

## 2015-11-10 LAB — FOLLICLE STIMULATING HORMONE: FSH: 41.7 m[IU]/mL

## 2015-11-10 LAB — HEPATITIS B CORE ANTIBODY, TOTAL: Hep B Core Total Ab: NEGATIVE

## 2015-11-10 LAB — ANGIOTENSIN CONVERTING ENZYME: Angiotensin-Converting Enzyme: 37 U/L (ref 14–82)

## 2015-11-10 LAB — HIV ANTIBODY (ROUTINE TESTING W REFLEX): HIV Screen 4th Generation wRfx: NONREACTIVE

## 2015-11-10 LAB — HEPATITIS C ANTIBODY: HCV Ab: <0.1 s/co ratio (ref 0.0–0.9)

## 2015-11-10 LAB — ESTRADIOL: Estradiol: 67.5 pg/mL

## 2015-11-10 NOTE — Telephone Encounter (Signed)
Patient noted some redness at 10/28/15 left breast biopsy site 2 days ago. Temp 101 last PM, better today. Some thickening.  Less symptomatic today. Patient to use warm compresses/ tylenol today. To come to the office on Monday, Feb 20 @ 8:30 AM for MD for exam.

## 2015-11-11 ENCOUNTER — Other Ambulatory Visit: Payer: Self-pay

## 2015-11-11 ENCOUNTER — Encounter: Payer: Self-pay | Admitting: General Surgery

## 2015-11-11 ENCOUNTER — Ambulatory Visit (INDEPENDENT_AMBULATORY_CARE_PROVIDER_SITE_OTHER): Payer: BC Managed Care – PPO | Admitting: General Surgery

## 2015-11-11 VITALS — BP 140/78 | HR 74 | Temp 98.3°F | Resp 14 | Ht 64.0 in | Wt 244.0 lb

## 2015-11-11 DIAGNOSIS — N63 Unspecified lump in unspecified breast: Secondary | ICD-10-CM

## 2015-11-11 DIAGNOSIS — T8140XA Infection following a procedure, unspecified, initial encounter: Secondary | ICD-10-CM | POA: Insufficient documentation

## 2015-11-11 MED ORDER — CEFADROXIL 500 MG PO CAPS: 500.0000 mg | ORAL_CAPSULE | Freq: Two times a day (BID) | ORAL | Status: DC

## 2015-11-11 NOTE — Addendum Note (Signed)
Addended by: Dominga Ferry on: 11/11/2015 09:03 AM   Modules accepted: Level of Service

## 2015-11-11 NOTE — Patient Instructions (Signed)
Patient to return in one week. 

## 2015-11-11 NOTE — Progress Notes (Signed)
Patient ID: Sherry Evans, female   DOB: [birth year], 55 y.o.   MRN: [id number]  Chief Complaint  Patient presents with  . Follow-up    HPI Sherry Evans is a 55 y.o. female here today following up from left breast biopsy, having redness, swelling, area feels warm the patient had reported having a fever of 101 on February 17, 100.6 last night. The patient had undergone a vacuum-assisted biopsy on 10/28/2015 for evidence of enlarging node that showed only reactive hyperplasia.  HPI  Past Medical History  Diagnosis Date  . Hypothyroidism   . History of total bilateral knee replacement   . History of sinus surgery     3 times  . GERD (gastroesophageal reflux disease)   . Depression     Past Surgical History  Procedure Laterality Date  . Bladder suspension  2009  . Back surgery  2008  . Knee surgery  2014  . Cholecystectomy    . Gastric restriction surgery  08/23/15  . Hernia repair  2016    hiatal  . Adnoids removed  1969  . Breast biopsy Left 1990's    benign    Family History  Problem Relation Age of Onset  . Breast cancer Maternal Aunt     great aunts. 70-80  . Breast cancer Maternal Grandmother 76    metatastic to breast  . Breast cancer Paternal Grandmother     70's    Social History Social History  Substance Use Topics  . Smoking status: Never Smoker   . Smokeless tobacco: None  . Alcohol Use: No    Allergies  Allergen Reactions  . Azithromycin Nausea And Vomiting  . Sulfa Antibiotics Rash    Current Outpatient Prescriptions  Medication Sig Dispense Refill  . azelastine (ASTELIN) 0.1 % nasal spray Place 2 sprays into both nostrils 2 (two) times daily. Use in each nostril as directed    . budesonide (PULMICORT) 0.5 MG/2ML nebulizer solution Take 0.5 mg by nebulization 2 (two) times daily as needed.    . Calcium Carbonate-Vitamin D 600-400 MG-UNIT per tablet Take by mouth. Reported on 10/28/2015    . cetirizine (ZYRTEC) 10 MG tablet Take 10 mg by mouth  daily.    . chlorhexidine (PERIDEX) 0.12 % solution Use as directed 15 mLs in the mouth or throat 2 (two) times daily.    . diclofenac sodium (VOLTAREN) 1 % GEL Apply topically 4 (four) times daily.    Marland Kitchen docusate (COLACE) 50 MG/5ML liquid Take by mouth.    . gabapentin (NEURONTIN) 600 MG tablet TAKE 2 TABLETS BY MOUTH WITH EVENING MEAL    . levothyroxine (SYNTHROID, LEVOTHROID) 100 MCG tablet Take by mouth.    . montelukast (SINGULAIR) 10 MG tablet Take by mouth.    . Multiple Vitamins-Minerals (ADVANCED MULTI EA PO) Take 2 tablets by mouth daily.    . ondansetron (ZOFRAN-ODT) 4 MG disintegrating tablet Take 4 mg by mouth every 8 (eight) hours as needed for nausea or vomiting.    . RABEprazole (ACIPHEX) 20 MG tablet Take 20 mg by mouth 2 times daily at 12 noon and 4 pm.    . sertraline (ZOLOFT) 25 MG tablet Take 50 mg by mouth.     . traZODone (DESYREL) 50 MG tablet Take 50 mg by mouth at bedtime as needed for sleep.     No current facility-administered medications for this visit.    Review of Systems Review of Systems  Constitutional: Negative.   Respiratory: Negative.  Cardiovascular: Negative.     Blood pressure 140/78, pulse 74, temperature 98.3 F (36.8 C), temperature source Oral, resp. rate 14, height 5\' 4"  (1.626 m), weight 244 lb (110.678 kg), last menstrual period 10/13/2015.  Physical Exam Physical Exam  Constitutional: She is oriented to person, place, and time. She appears well-developed and well-nourished.  Eyes: Conjunctivae are normal. No scleral icterus.  Neck: Neck supple.  Pulmonary/Chest:    5cm warmth and redness of left breast  Area general swelling, no pocket.    Lymphadenopathy:    She has no cervical adenopathy.  Neurological: She is alert and oriented to person, place, and time.  Skin: Skin is warm and dry.    Data Reviewed Limited ultrasound of the area showed no evidence of an abscess, only edematous tissue. No images, no charge.  Assessment     Postop infection status post vacuum biopsy.    Plan    The patient was placed on Duricef 500 mg by mouth twice a day for 10 days. Daily yogurt or probiotics to minimize the risk of vaginal yeast infection.  The patient will continue to make use of local heat to the area.    Patient to return in one week. PCP:  Vernell Morgans 11/11/2015, 8:55 AM

## 2015-11-13 ENCOUNTER — Ambulatory Visit
Admission: RE | Admit: 2015-11-13 | Discharge: 2015-11-13 | Disposition: A | Discharge status: Home / Self Care | Payer: BC Managed Care – PPO | Source: Ambulatory Visit | Attending: Hematology and Oncology | Admitting: Hematology and Oncology

## 2015-11-13 DIAGNOSIS — R590 Localized enlarged lymph nodes: Secondary | ICD-10-CM

## 2015-11-14 ENCOUNTER — Telehealth: Payer: Self-pay | Admitting: *Deleted

## 2015-11-14 ENCOUNTER — Ambulatory Visit: Payer: BC Managed Care – PPO | Admitting: *Deleted

## 2015-11-14 DIAGNOSIS — T8140XA Infection following a procedure, unspecified, initial encounter: Secondary | ICD-10-CM

## 2015-11-14 NOTE — Progress Notes (Signed)
5 cm area of fullness but erythremia is much less. Assessed by Dr Jamal Collin. Continue to improve. Continue heating pad and duricef.

## 2015-11-14 NOTE — Telephone Encounter (Signed)
Patient had a breast procedure on 10/28/15 by Dr.Byrnett and she stated that the knot beside her incision has gotten bigger, the size of a "egg". She was wanting to make sure that was still okay.

## 2015-11-14 NOTE — Patient Instructions (Signed)
The patient is aware to call back for any questions or concerns.  

## 2015-11-14 NOTE — Telephone Encounter (Signed)
Patient is coming in this afternoon.

## 2015-11-14 NOTE — Telephone Encounter (Signed)
Needs nurse appt

## 2015-11-15 ENCOUNTER — Ambulatory Visit: Payer: BC Managed Care – PPO | Admitting: Hematology and Oncology

## 2015-11-18 ENCOUNTER — Inpatient Hospital Stay (HOSPITAL_BASED_OUTPATIENT_CLINIC_OR_DEPARTMENT_OTHER): Payer: BC Managed Care – PPO | Admitting: Hematology and Oncology

## 2015-11-18 ENCOUNTER — Encounter: Payer: Self-pay | Admitting: Hematology and Oncology

## 2015-11-18 VITALS — BP 147/83 | HR 84 | Temp 97.0°F | Resp 18 | Ht 64.0 in | Wt 244.5 lb

## 2015-11-18 DIAGNOSIS — K219 Gastro-esophageal reflux disease without esophagitis: Secondary | ICD-10-CM

## 2015-11-18 DIAGNOSIS — N63 Unspecified lump in breast: Secondary | ICD-10-CM

## 2015-11-18 DIAGNOSIS — Z9884 Bariatric surgery status: Secondary | ICD-10-CM

## 2015-11-18 DIAGNOSIS — E039 Hypothyroidism, unspecified: Secondary | ICD-10-CM

## 2015-11-18 DIAGNOSIS — R634 Abnormal weight loss: Secondary | ICD-10-CM

## 2015-11-18 DIAGNOSIS — Z803 Family history of malignant neoplasm of breast: Secondary | ICD-10-CM

## 2015-11-18 DIAGNOSIS — Z9889 Other specified postprocedural states: Secondary | ICD-10-CM

## 2015-11-18 DIAGNOSIS — R590 Localized enlarged lymph nodes: Secondary | ICD-10-CM | POA: Diagnosis not present

## 2015-11-18 DIAGNOSIS — Z79899 Other long term (current) drug therapy: Secondary | ICD-10-CM

## 2015-11-18 DIAGNOSIS — N926 Irregular menstruation, unspecified: Secondary | ICD-10-CM

## 2015-11-18 DIAGNOSIS — R61 Generalized hyperhidrosis: Secondary | ICD-10-CM

## 2015-11-18 NOTE — Progress Notes (Signed)
[facility] day:  11/18/2015   Chief Complaint: Sherry Evans is a 55 y.o. female with lymphadenopathy who is seen for review of initial work-up and discussion regarding direction of therapy.  HPI: The patient was last seen in the medical oncology clinic on 11/08/2015.  At that time, she was seen for initial consultation regarding axillary adenopathy.   Ultrasound examination of the left breast showed a well-defined 0.8 x 0.9 x 0.9 cm nodule in the 2 o'clock position, 14 cm from the nipple.  There was some posterior acoustic enhancement.  In the axilla there was a moderately enlarged lymph node measuring 0.8 x 1.4 x 2.2 cm.  Core biopsy and FNA sampling of a left axillary node on 10/28/2015 revealed small fragments of lymphoid tissue.   Flow cytometry revealed no evidence of a clonal population.  Left breast core needle biopsy at the 2 o'clock position revealed small fragments of lymphoid tissue.  Immunohistochemistry revealed a mixture of T and B cells.  Findings were felt reactive.  Symptomatically, she had lost 55 pound weight loss since bariatric surgery in 09/12/2015.  She underwent a work-up on 11/08/2015.  CBC revealed a hematocrit of 41.7, hemoglobin 14.1, MCV 94.6, platelets 195,000, WBC 5800 with an Christopher of 3600.  Comprehensive metabolic panel was normal.  The following studies were normal:  hepatitis B core antibody total, hepatitis C antibody, HIV testing,  ACE level (37), LDH (130), uric acid (3.5).  FSH was 41.7 (post-menopausal range) with estradiol of 67.5 (postmenopausal < 6 - 54.7).  Chest CT without contrast on 11/13/2015 revealed prominent left axillary nodes (9 mm short axis) and post biopsy inflammation in the left breast.  There was no mediastinal adenopathy or pulmonary nodules.  The patient was seen by Dr, Bary Castilla on 11/11/2015 for fever, redness and swelling of the left breast.  She was started on Duricef 500 mg BID x 10 days.   She completes her antibiotics on 11/20/2015.  She notes that her breast is doing better.  There is a residual knot.  She denies any fever.  She has a follow-up with Dr. Bary Castilla tomorrow.  Past Medical History  Diagnosis Date  . Hypothyroidism   . History of total bilateral knee replacement   . History of sinus surgery     3 times  . GERD (gastroesophageal reflux disease)   . Depression     Past Surgical History  Procedure Laterality Date  . Bladder suspension  2009  . Back surgery  2008  . Knee surgery  2014  . Cholecystectomy    . Gastric restriction surgery  08/23/15  . Hernia repair  2016    hiatal  . Adnoids removed  1969  . Breast biopsy Left 1990's    benign    Family History  Problem Relation Age of Onset  . Breast cancer Maternal Aunt     great aunts. 70-80  . Breast cancer Maternal Grandmother 89    metatastic to breast  . Breast cancer Paternal Grandmother     70's    Social History:  reports that she has never smoked. She does not have any smokeless tobacco history on file. She reports that she does not drink alcohol or use illicit drugs.  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  Allergies:  Allergies  Allergen Reactions  . Azithromycin Nausea And Vomiting  . Sulfa Antibiotics Rash    Current Medications: Current Outpatient Prescriptions  Medication Sig Dispense Refill  . azelastine (ASTELIN) 0.1 % nasal spray Place 2 sprays into both nostrils 2 (two) times daily. Use in each nostril as directed    . budesonide (PULMICORT) 0.5 MG/2ML nebulizer solution Take 0.5 mg by nebulization 2 (two) times daily as needed.    . Calcium Carbonate-Vitamin D 600-400 MG-UNIT per tablet Take by mouth. Reported on 10/28/2015    . cefadroxil (DURICEF) 500 MG capsule Take 1 capsule (500 mg total) by mouth 2 (two) times daily. 20 capsule 1  . cetirizine (ZYRTEC) 10 MG tablet Take 10 mg by mouth daily.    . chlorhexidine (PERIDEX) 0.12 % solution Use as directed 15  mLs in the mouth or throat 2 (two) times daily.    . diclofenac sodium (VOLTAREN) 1 % GEL Apply topically 4 (four) times daily.    Marland Kitchen docusate (COLACE) 50 MG/5ML liquid Take by mouth.    . gabapentin (NEURONTIN) 600 MG tablet TAKE 2 TABLETS BY MOUTH WITH EVENING MEAL    . levothyroxine (SYNTHROID, LEVOTHROID) 100 MCG tablet Take by mouth.    . montelukast (SINGULAIR) 10 MG tablet Take by mouth.    . Multiple Vitamins-Minerals (ADVANCED MULTI EA PO) Take 2 tablets by mouth daily.    . ondansetron (ZOFRAN-ODT) 4 MG disintegrating tablet Take 4 mg by mouth every 8 (eight) hours as needed for nausea or vomiting.    . RABEprazole (ACIPHEX) 20 MG tablet Take 20 mg by mouth 2 times daily at 12 noon and 4 pm.    . sertraline (ZOLOFT) 25 MG tablet Take 50 mg by mouth.     . traZODone (DESYREL) 50 MG tablet Take 50 mg by mouth at bedtime as needed for sleep.     No current facility-administered medications for this visit.    Review of Systems:  GENERAL:  Feels good.  Active.  No fevers.  Intermittent night sweats. Weight loss of 55 pounds since bariatric surgery 2 month ago.  Weight stable since last visit. PERFORMANCE STATUS (ECOG):  0 HEENT:  No visual changes, runny nose, sore throat, mouth sores or tenderness. Lungs: No shortness of breath or cough.  No hemoptysis. Cardiac:  No chest pain, palpitations, orthopnea, or PND. GI:  No nausea, vomiting, diarrhea, constipation, melena or hematochezia.  Colonoscopy 5-6 years ago was "ok". GU:  No urgency, frequency, dysuria, or hematuria.  Somewhat erratic menses. Musculoskeletal:  No back pain.  No joint pain.  No muscle tenderness. Extremities:  No pain or swelling. Skin:  Left breast s/p biopsy, improving on antibiotics.  No rashes or skin changes. Neuro:  No headache, numbness or weakness, balance or coordination issues. Endocrine:  No diabetes.  Thyroid disease on Synthroid.  Some hot flashes and night sweats for > 1 year. Psych:  No mood changes,  depression or anxiety. Pain:  No focal pain. Review of systems:  All other systems reviewed and found to be negative.  Physical Exam: Blood pressure 147/83, pulse 84, temperature 97 F (36.1 C), temperature source Tympanic, resp. rate 18, height '5\' 4"'  (1.626 m), weight 244 lb 7.8 oz (110.9 kg), last menstrual period 10/13/2015. GENERAL:  Well developed, well nourished, sitting comfortably in the exam room in no acute distress. MENTAL STATUS:  Alert and oriented to person, place and time. HEAD:  Short styled brown hair.  Normocephalic, atraumatic, face symmetric, no Cushingoid features. EYES:  Glasses.  Blue eyes.  No conjunctivitis or scleral icterus. NEUROLOGICAL: Unremarkable. PSYCH:  Appropriate.  No visits with results  within 3 Day(s) from this visit. Latest known visit with results is:  Appointment on 11/08/2015  Component Date Value Ref Range Status  . WBC 11/08/2015 5.8  3.6 - 11.0 K/uL Final  . RBC 11/08/2015 4.41  3.80 - 5.20 MIL/uL Final  . Hemoglobin 11/08/2015 14.1  12.0 - 16.0 g/dL Final  . HCT 11/08/2015 41.7  35.0 - 47.0 % Final  . MCV 11/08/2015 94.6  80.0 - 100.0 fL Final  . MCH 11/08/2015 32.0  26.0 - 34.0 pg Final  . MCHC 11/08/2015 33.8  32.0 - 36.0 g/dL Final  . RDW 11/08/2015 15.2* 11.5 - 14.5 % Final  . Platelets 11/08/2015 195  150 - 440 K/uL Final  . Neutrophils Relative % 11/08/2015 62   Final  . Neutro Abs 11/08/2015 3.6  1.4 - 6.5 K/uL Final  . Lymphocytes Relative 11/08/2015 27   Final  . Lymphs Abs 11/08/2015 1.6  1.0 - 3.6 K/uL Final  . Monocytes Relative 11/08/2015 9   Final  . Monocytes Absolute 11/08/2015 0.5  0.2 - 0.9 K/uL Final  . Eosinophils Relative 11/08/2015 1   Final  . Eosinophils Absolute 11/08/2015 0.1  0 - 0.7 K/uL Final  . Basophils Relative 11/08/2015 1   Final  . Basophils Absolute 11/08/2015 0.1  0 - 0.1 K/uL Final  . Sodium 11/08/2015 137  135 - 145 mmol/L Final  . Potassium 11/08/2015 3.7  3.5 - 5.1 mmol/L Final  . Chloride  11/08/2015 107  101 - 111 mmol/L Final  . CO2 11/08/2015 26  22 - 32 mmol/L Final  . Glucose, Bld 11/08/2015 108* 65 - 99 mg/dL Final  . BUN 11/08/2015 19  6 - 20 mg/dL Final  . Creatinine, Ser 11/08/2015 0.83  0.44 - 1.00 mg/dL Final  . Calcium 11/08/2015 9.2  8.9 - 10.3 mg/dL Final  . Total Protein 11/08/2015 7.6  6.5 - 8.1 g/dL Final  . Albumin 11/08/2015 4.1  3.5 - 5.0 g/dL Final  . AST 11/08/2015 23  15 - 41 U/L Final  . ALT 11/08/2015 21  14 - 54 U/L Final  . Alkaline Phosphatase 11/08/2015 48  38 - 126 U/L Final  . Total Bilirubin 11/08/2015 0.6  0.3 - 1.2 mg/dL Final  . GFR calc non Af Amer 11/08/2015 >60  >60 mL/min Final  . GFR calc Af Amer 11/08/2015 >60  >60 mL/min Final   Comment: (NOTE) The eGFR has been calculated using the CKD EPI equation. This calculation has not been validated in all clinical situations. eGFR's persistently <60 mL/min signify possible Chronic Kidney Disease.   . Anion gap 11/08/2015 4* 5 - 15 Final  . Hep B Core Total Ab 11/08/2015 Negative  Negative Final   Comment: (NOTE) Performed At: [facility] [place], [place] [id number] Lindon Romp MD [id number]   . HCV Ab 11/08/2015 <0.1  0.0 - 0.9 s/co ratio Final   Comment: (NOTE)                                  Negative:     < 0.8                             Indeterminate: 0.8 - 0.9  Positive:     > 0.9 The CDC recommends that a positive HCV antibody result be followed up with a HCV Nucleic Acid Amplification test (272536). Performed At: [facility] [place], [place] [id number] Lindon Romp MD [id number]   . HIV Screen 4th Generation wRfx 11/08/2015 Non Reactive  Non Reactive Final   Comment: (NOTE) Performed At: [facility] [place], [place] [id number] Lindon Romp MD [id number]   . Angiotensin-Converting Enzyme 11/08/2015 37  14 - 82 U/L Final   Comment:  (NOTE) Performed At: [facility] [place], [place] [id number] Lindon Romp MD [id number]   . LDH 11/08/2015 130  98 - 192 U/L Final  . Uric Acid, Serum 11/08/2015 3.5  2.3 - 6.6 mg/dL Final  . [facility] 11/08/2015 41.7   Final   Comment: (NOTE)                    Adult Female:                      Follicular phase      3.5 -  12.5                      Ovulation phase       4.7 -  21.5                      Luteal phase          1.7 -   7.7                      Postmenopausal       25.8 - 134.8 Performed At: [facility] [place], [place] [id number] Lindon Romp MD [id number]   . Estradiol 11/08/2015 67.5   Final   Comment: (NOTE)                    Adult Female:                      Follicular phase   54.6 -   166.0                      Ovulation phase    85.8 -   498.0                      Luteal phase       43.8 -   211.0                      Postmenopausal     <6.0 -    54.7                    Pregnancy                      1st trimester     215.0 - >4300.0                    Girls (1-10 years)    6.0 -    27.0 Roche ECLIA methodology Performed At: [facility] [address] [place], [place] [id number] Lindon Romp MD [id number]     Assessment:  Sherry Evans is a 55 y.o. female with reactive axillary adenopathy.  Diagnostic  mammogram on 10/17/2015 revealed a persistent mass in the upper-outer quadrant of the left breast. Ultrasound noted a 6 x 8 x 10 mm intramammary lymph node.  There were multiple enlarged lymph nodes in the axilla were reported.   Ultrasound examination of the left breast showed a well-defined 0.8 x 0.9 x 0.9 cm nodule in the 2 o'clock position, 14 cm from the nipple.  There was some posterior acoustic enhancement. Left breast core needle biopsy at the 2 o'clock position on 10/28/2015 revealed small fragments of lymphoid tissue.  Immunohistochemistry revealed a mixture of T and B cells.   Findings were felt reactive.  Axillary ultrasound revealed a moderately enlarged lymph node measuring 0.8 x 1.4 x 2.2 cm.  Core biopsy and FNA sampling of a left axillary node on 10/28/2015 revealed small fragments of lymphoid tissue.   Flow cytometry revealed no evidence of a clonal population.  Work-up on 11/08/2015 revealed the following normal studies:  CBC with diff, CMP, LDH, uric acid, hepatitis B core antibody total, hepatitis C antibody, HIV testing,  ACE level (37).   Chest CT without contrast on 11/13/2015 revealed prominent left axillary nodes (9 mm short axis) and post biopsy inflammation in the left breast.  There was no mediastinal adenopathy or pulmonary nodules.  She has a family history of breast cancer.  My Risk testing on 08/31/2013 revealed no clinically significant mutation.  She has lost 55 pounds since gastric surgery on 12/22/016.  She has intermittent night sweats.  She is still having menses.  FSH and estradiol on 11/08/2015 are consistent with a peri-menopausal status.  Symptomatically, she is doing well.  Left breast post-op infection is improving on Duracef.  Plan: 1. Discuss laboratory evaluation and chest CT scan.  Discuss reactive adenopathy. 2. Discuss peri-menopausal status. 3. Complete antibiotics. 4. Follow-up with Dr., Bary Castilla tomorrow as scheduled. 5. RTC prn.   Lequita Asal, MD  11/18/2015,  4:12 PM

## 2015-11-19 ENCOUNTER — Ambulatory Visit: Payer: BC Managed Care – PPO | Admitting: General Surgery

## 2015-11-19 ENCOUNTER — Encounter: Payer: Self-pay | Admitting: General Surgery

## 2015-11-19 VITALS — BP 116/68 | HR 70 | Resp 12 | Ht 64.0 in | Wt 244.0 lb

## 2015-11-19 DIAGNOSIS — T8140XA Infection following a procedure, unspecified, initial encounter: Secondary | ICD-10-CM

## 2015-11-19 NOTE — Progress Notes (Signed)
Patient ID: Sherry Evans, female   DOB: 09-30-1960, 55 y.o.   MRN: [id number]  Chief Complaint  Patient presents with  . Routine Post Op    infection     HPI Sherry Evans is a 55 y.o. female.  She is here today for follow up left breast biopsy. She states the knot has improved and the redness is better. She does have one day of antibiotic left to take.  I personally reviewed the patient's history.   HPI  Past Medical History  Diagnosis Date  . Hypothyroidism   . History of total bilateral knee replacement   . History of sinus surgery     3 times  . GERD (gastroesophageal reflux disease)   . Depression     Past Surgical History  Procedure Laterality Date  . Bladder suspension  2009  . Back surgery  2008  . Knee surgery  2014  . Cholecystectomy    . Gastric restriction surgery  08/23/15  . Hernia repair  2016    hiatal  . Adnoids removed  1969  . Breast biopsy Left 1990's    benign    Family History  Problem Relation Age of Onset  . Breast cancer Maternal Aunt     great aunts. 70-80  . Breast cancer Maternal Grandmother 22    metatastic to breast  . Breast cancer Paternal Grandmother     70's    Social History Social History  Substance Use Topics  . Smoking status: Never Smoker   . Smokeless tobacco: Never Used  . Alcohol Use: No    Allergies  Allergen Reactions  . Azithromycin Nausea And Vomiting  . Sulfa Antibiotics Rash    Current Outpatient Prescriptions  Medication Sig Dispense Refill  . azelastine (ASTELIN) 0.1 % nasal spray Place 2 sprays into both nostrils 2 (two) times daily. Use in each nostril as directed    . budesonide (PULMICORT) 0.5 MG/2ML nebulizer solution Take 0.5 mg by nebulization 2 (two) times daily as needed.    . Calcium Carbonate-Vitamin D 600-400 MG-UNIT per tablet Take by mouth. Reported on 10/28/2015    . cetirizine (ZYRTEC) 10 MG tablet Take 10 mg by mouth daily.    . chlorhexidine (PERIDEX) 0.12 % solution Use as  directed 15 mLs in the mouth or throat 2 (two) times daily.    . diclofenac sodium (VOLTAREN) 1 % GEL Apply topically 4 (four) times daily.    Marland Kitchen docusate (COLACE) 50 MG/5ML liquid Take by mouth.    . gabapentin (NEURONTIN) 600 MG tablet TAKE 2 TABLETS BY MOUTH WITH EVENING MEAL    . levothyroxine (SYNTHROID, LEVOTHROID) 100 MCG tablet Take by mouth.    . montelukast (SINGULAIR) 10 MG tablet Take by mouth.    . Multiple Vitamins-Minerals (ADVANCED MULTI EA PO) Take 2 tablets by mouth daily.    . ondansetron (ZOFRAN-ODT) 4 MG disintegrating tablet Take 4 mg by mouth every 8 (eight) hours as needed for nausea or vomiting.    . RABEprazole (ACIPHEX) 20 MG tablet Take 20 mg by mouth 2 times daily at 12 noon and 4 pm.    . sertraline (ZOLOFT) 25 MG tablet Take 50 mg by mouth.     . traZODone (DESYREL) 50 MG tablet Take 50 mg by mouth at bedtime as needed for sleep.     No current facility-administered medications for this visit.    Review of Systems Review of Systems  Constitutional: Negative.   Respiratory: Negative.  Cardiovascular: Negative.     Blood pressure 116/68, pulse 70, resp. rate 12, height 5\' 4"  (1.626 m), weight 244 lb (110.678 kg), last menstrual period 10/13/2015.  Physical Exam Physical Exam  Constitutional: She is oriented to person, place, and time. She appears well-developed and well-nourished.  HENT:  Mouth/Throat: Oropharynx is clear and moist.  Eyes: Conjunctivae are normal. No scleral icterus.  Neck: Neck supple.  Pulmonary/Chest:  No fluid with aspiration left breast.  Lymphadenopathy:    She has no cervical adenopathy.  Neurological: She is alert and oriented to person, place, and time.  Skin: Skin is warm and dry.  Psychiatric: Her behavior is normal.    Data Reviewed Chest CT dated 11/13/2015 as well as medical oncology notes from Nolon Stalls, M.D. were reviewed. The former shows laboratory changes in the area of the recent biopsy but markedly  small lymph nodes and identified on ultrasound.  No pathologic lymphadenopathy has been identified in the patient is released from ongoing follow-up with medical oncology.  Assessment    Left breast infection post core biopsy for an enlarged lymph node.    Plan    There is still a faint area of erythema along the incision, markedly improved from prior to initiation of antibiotic therapy as well as a firm thickening. It was elected to complete an aspiration in the event that a formed abscess had developed menses not previously evident on ultrasound or on earlier CT. ChloraPrep was applied to the skin and 1 mL of 1% plain Xylocaine was utilized. Making use of an 18-gauge needle the area of firmness was aspirated with no significant return of material. The procedure was well tolerated.  The patient will complete her present course of antibiotics and continued to make use of local heat to the area.     Follow up in one week.  PCP:  Leta Baptist This information has been scribed by Karie Fetch [place].   Robert Bellow 11/20/2015, 5:42 AM

## 2015-11-19 NOTE — Patient Instructions (Signed)
The patient is aware to call back for any questions or concerns.  

## 2015-11-27 ENCOUNTER — Encounter: Payer: Self-pay | Admitting: General Surgery

## 2015-11-27 ENCOUNTER — Ambulatory Visit (INDEPENDENT_AMBULATORY_CARE_PROVIDER_SITE_OTHER): Payer: BC Managed Care – PPO | Admitting: General Surgery

## 2015-11-27 VITALS — BP 128/70 | HR 76 | Resp 12 | Ht 65.0 in | Wt 242.0 lb

## 2015-11-27 DIAGNOSIS — T8140XA Infection following a procedure, unspecified, initial encounter: Secondary | ICD-10-CM

## 2015-11-27 DIAGNOSIS — Z9989 Dependence on other enabling machines and devices: Secondary | ICD-10-CM | POA: Insufficient documentation

## 2015-11-27 DIAGNOSIS — G4733 Obstructive sleep apnea (adult) (pediatric): Secondary | ICD-10-CM | POA: Insufficient documentation

## 2015-11-27 DIAGNOSIS — R590 Localized enlarged lymph nodes: Secondary | ICD-10-CM

## 2015-11-27 NOTE — Progress Notes (Signed)
Patient ID: Sherry Evans, female   DOB: 1961-06-22, 55 y.o.   MRN: [id number]  Chief Complaint  Patient presents with  . Follow-up    HPI Sherry Evans is a 55 y.o. female.  She is here today for follow up left breast biopsy. She states the knot has improved and the redness is better.   HPI  Past Medical History  Diagnosis Date  . Hypothyroidism   . History of total bilateral knee replacement   . History of sinus surgery     3 times  . GERD (gastroesophageal reflux disease)   . Depression     Past Surgical History  Procedure Laterality Date  . Bladder suspension  2009  . Back surgery  2008  . Knee surgery  2014  . Cholecystectomy    . Gastric restriction surgery  08/23/15  . Hernia repair  2016    hiatal  . Adnoids removed  1969  . Breast biopsy Left 1990's    benign    Family History  Problem Relation Age of Onset  . Breast cancer Maternal Aunt     great aunts. 70-80  . Breast cancer Maternal Grandmother 53    metatastic to breast  . Breast cancer Paternal Grandmother     70's    Social History Social History  Substance Use Topics  . Smoking status: Never Smoker   . Smokeless tobacco: Never Used  . Alcohol Use: No    Allergies  Allergen Reactions  . Azithromycin Nausea And Vomiting  . Sulfa Antibiotics Rash    Current Outpatient Prescriptions  Medication Sig Dispense Refill  . azelastine (ASTELIN) 0.1 % nasal spray Place 2 sprays into both nostrils 2 (two) times daily. Use in each nostril as directed    . budesonide (PULMICORT) 0.5 MG/2ML nebulizer solution Take 0.5 mg by nebulization 2 (two) times daily as needed.    . Calcium Carbonate-Vitamin D 600-400 MG-UNIT per tablet Take by mouth. Reported on 10/28/2015    . cetirizine (ZYRTEC) 10 MG tablet Take 10 mg by mouth daily.    . chlorhexidine (PERIDEX) 0.12 % solution Use as directed 15 mLs in the mouth or throat 2 (two) times daily.    . diclofenac sodium (VOLTAREN) 1 % GEL Apply topically 4 (four)  times daily.    Marland Kitchen docusate (COLACE) 50 MG/5ML liquid Take by mouth.    . gabapentin (NEURONTIN) 600 MG tablet TAKE 2 TABLETS BY MOUTH WITH EVENING MEAL    . levothyroxine (SYNTHROID, LEVOTHROID) 100 MCG tablet Take by mouth.    . montelukast (SINGULAIR) 10 MG tablet Take by mouth.    . Multiple Vitamins-Minerals (ADVANCED MULTI EA PO) Take 2 tablets by mouth daily.    . ondansetron (ZOFRAN-ODT) 4 MG disintegrating tablet Take 4 mg by mouth every 8 (eight) hours as needed for nausea or vomiting.    . RABEprazole (ACIPHEX) 20 MG tablet Take 20 mg by mouth 2 times daily at 12 noon and 4 pm.    . sertraline (ZOLOFT) 25 MG tablet Take 50 mg by mouth.     . traZODone (DESYREL) 50 MG tablet Take 50 mg by mouth at bedtime as needed for sleep.     No current facility-administered medications for this visit.    Review of Systems Review of Systems  Constitutional: Negative.   Respiratory: Negative.   Cardiovascular: Negative.     Blood pressure 128/70, pulse 76, resp. rate 12, height 5\' 5"  (1.651 m), weight 242 lb (109.77  kg), last menstrual period 10/13/2015.  Physical Exam Physical Exam  Constitutional: She is oriented to person, place, and time. She appears well-developed and well-nourished.  Cardiovascular: Normal rate, regular rhythm and normal heart sounds.   Pulmonary/Chest: Breath sounds normal.    Lymphadenopathy:    She has no axillary adenopathy.  Neurological: She is alert and oriented to person, place, and time.  Skin: Skin is warm and dry.      Assessment    Continued improvement in inflammatory process postbiopsy.    Plan       Patient to return in three months.  This information has been scribed by Gaspar Cola CMA.  PCP:  Ahmed Prima 11/27/2015, 9:08 PM

## 2015-11-27 NOTE — Patient Instructions (Addendum)
The patient is aware to call back for any questions or concerns. Patient to return in three months.  

## 2015-12-05 DIAGNOSIS — Z96653 Presence of artificial knee joint, bilateral: Secondary | ICD-10-CM | POA: Insufficient documentation

## 2015-12-12 ENCOUNTER — Encounter: Payer: Self-pay | Admitting: *Deleted

## 2016-02-26 ENCOUNTER — Encounter: Payer: Self-pay | Admitting: *Deleted

## 2016-03-02 ENCOUNTER — Ambulatory Visit: Payer: BC Managed Care – PPO | Admitting: General Surgery

## 2016-03-02 DIAGNOSIS — M7581 Other shoulder lesions, right shoulder: Secondary | ICD-10-CM | POA: Insufficient documentation

## 2016-03-02 DIAGNOSIS — S46101A Unspecified injury of muscle, fascia and tendon of long head of biceps, right arm, initial encounter: Secondary | ICD-10-CM | POA: Insufficient documentation

## 2016-03-04 ENCOUNTER — Encounter: Payer: Self-pay | Admitting: General Surgery

## 2016-03-04 ENCOUNTER — Ambulatory Visit (INDEPENDENT_AMBULATORY_CARE_PROVIDER_SITE_OTHER): Payer: BC Managed Care – PPO | Admitting: General Surgery

## 2016-03-04 DIAGNOSIS — T8140XA Infection following a procedure, unspecified, initial encounter: Secondary | ICD-10-CM

## 2016-03-04 DIAGNOSIS — N63 Unspecified lump in breast: Secondary | ICD-10-CM

## 2016-03-04 NOTE — Progress Notes (Signed)
Patient ID: Sherry Evans, female   DOB: July 11, 1961, 55 y.o.   MRN: [id number]  Chief Complaint  Patient presents with  . Follow-up    breast abscess    HPI Sherry Evans is a 55 y.o. female here today for her follow up left breast abscess. She had an biopsy on her left breast 10/28/15 that showed a reactive lymph node. No breast pathology.. Patient states the area is doing well with no problems at this time. .  I personally reviewed the patient's history. HPI  Past Medical History  Diagnosis Date  . Hypothyroidism   . History of sinus surgery     3 times  . GERD (gastroesophageal reflux disease)   . Depression     Past Surgical History  Procedure Laterality Date  . Bladder suspension  2009  . Back surgery  2008  . Knee surgery  2014  . Cholecystectomy    . Gastric restriction surgery  08/23/15  . Hernia repair  2016    hiatal  . Adnoids removed  1969  . Breast biopsy Left 1990's    benign  . Breast biopsy  October 28, 2015    SMALL FRAGMENTS OF LYMPHOID TISSUE  . Colonoscopy      Family History  Problem Relation Age of Onset  . Breast cancer Maternal Aunt     great aunts. 70-80  . Breast cancer Maternal Grandmother 24    metatastic to breast  . Breast cancer Paternal Grandmother     70's    Social History Social History  Substance Use Topics  . Smoking status: Never Smoker   . Smokeless tobacco: Never Used  . Alcohol Use: No    Allergies  Allergen Reactions  . Azithromycin Nausea And Vomiting  . Sulfa Antibiotics Rash    Current Outpatient Prescriptions  Medication Sig Dispense Refill  . azelastine (ASTELIN) 0.1 % nasal spray Place 2 sprays into both nostrils 2 (two) times daily. Use in each nostril as directed    . budesonide (PULMICORT) 0.5 MG/2ML nebulizer solution Take 0.5 mg by nebulization 2 (two) times daily as needed.    . Calcium Carbonate-Vitamin D 600-400 MG-UNIT per tablet Take by mouth. Reported on 10/28/2015    . cetirizine (ZYRTEC) 10  MG tablet Take 10 mg by mouth daily.    . chlorhexidine (PERIDEX) 0.12 % solution Use as directed 15 mLs in the mouth or throat 2 (two) times daily.    . diclofenac sodium (VOLTAREN) 1 % GEL Apply topically 4 (four) times daily.    Marland Kitchen docusate (COLACE) 50 MG/5ML liquid Take by mouth.    . gabapentin (NEURONTIN) 600 MG tablet TAKE 2 TABLETS BY MOUTH WITH EVENING MEAL    . levothyroxine (SYNTHROID, LEVOTHROID) 100 MCG tablet Take by mouth.    . montelukast (SINGULAIR) 10 MG tablet Take by mouth.    . Multiple Vitamins-Minerals (ADVANCED MULTI EA PO) Take 2 tablets by mouth daily.    . ondansetron (ZOFRAN-ODT) 4 MG disintegrating tablet Take 4 mg by mouth every 8 (eight) hours as needed for nausea or vomiting.    . RABEprazole (ACIPHEX) 20 MG tablet Take 20 mg by mouth 2 times daily at 12 noon and 4 pm.    . sertraline (ZOLOFT) 25 MG tablet Take 50 mg by mouth.     . traZODone (DESYREL) 50 MG tablet Take 50 mg by mouth at bedtime as needed for sleep.     No current facility-administered medications for this visit.  Review of Systems Review of Systems  Constitutional: Negative.   Respiratory: Negative.   Cardiovascular: Negative.     Blood pressure 122/64, pulse 74, resp. rate 12, height 5\' 4"  (1.626 m), weight 220 lb (99.791 kg), last menstrual period 01/03/2016.  Physical Exam Physical Exam  Constitutional: She is oriented to person, place, and time. She appears well-developed.  Eyes: Conjunctivae are normal. No scleral icterus.  Neck: Neck supple.  Cardiovascular: Normal rate, regular rhythm and normal heart sounds.   Pulmonary/Chest: Effort normal and breath sounds normal. Right breast exhibits no inverted nipple, no mass, no nipple discharge, no skin change and no tenderness. Left breast exhibits no inverted nipple, no mass, no nipple discharge, no skin change and no tenderness.    Abdominal: Soft. Bowel sounds are normal. There is no tenderness.  Lymphadenopathy:    She has no  cervical adenopathy.    She has no axillary adenopathy.  Neurological: She is alert and oriented to person, place, and time.  Skin: Skin is warm and dry.    Data Reviewed 40 pound weight loss since February 2017 exam.  Assessment    Benign breast exam.    Plan    Resume annual screening mammograms in spring 2018 with her new GYN provider. (Dr. Ammie Dalton has retired)   Patient to return as needed.  PCP:  Radford Pax,  Ref. Dr. Vernie Ammons This information has been scribed by Gaspar Cola CMA.    Robert Bellow 03/04/2016, 9:28 AM

## 2016-03-04 NOTE — Patient Instructions (Signed)
Patient to return as needed. 

## 2016-03-25 ENCOUNTER — Encounter
Admission: RE | Admit: 2016-03-25 | Discharge: 2016-03-25 | Disposition: A | Discharge status: Home / Self Care | Payer: Worker's Compensation | Source: Ambulatory Visit | Attending: Surgery | Admitting: Surgery

## 2016-03-25 DIAGNOSIS — Z01812 Encounter for preprocedural laboratory examination: Secondary | ICD-10-CM | POA: Diagnosis present

## 2016-03-25 HISTORY — DX: Sleep apnea, unspecified: G47.30

## 2016-03-25 HISTORY — DX: Anxiety disorder, unspecified: F41.9

## 2016-03-25 LAB — DIFFERENTIAL
BASOS ABS: 0.1 10*3/uL (ref 0–0.1)
Basophils Relative: 1 %
Eosinophils Absolute: 0.1 10*3/uL (ref 0–0.7)
Eosinophils Relative: 1 %
LYMPHS ABS: 2.4 10*3/uL (ref 1.0–3.6)
LYMPHS PCT: 41 %
MONO ABS: 0.4 10*3/uL (ref 0.2–0.9)
MONOS PCT: 7 %
NEUTROS ABS: 2.9 10*3/uL (ref 1.4–6.5)
Neutrophils Relative %: 50 %

## 2016-03-25 LAB — CBC
HEMATOCRIT: 38.2 % (ref 35.0–47.0)
HEMOGLOBIN: 13.1 g/dL (ref 12.0–16.0)
MCH: 32.3 pg (ref 26.0–34.0)
MCHC: 34.4 g/dL (ref 32.0–36.0)
MCV: 93.8 fL (ref 80.0–100.0)
Platelets: 200 10*3/uL (ref 150–440)
RBC: 4.07 MIL/uL (ref 3.80–5.20)
RDW: 12.7 % (ref 11.5–14.5)
WBC: 5.8 10*3/uL (ref 3.6–11.0)

## 2016-03-25 NOTE — Patient Instructions (Signed)
  Your procedure is scheduled [id number] 13, 2017 (Thursday) Report to Same Day Surgery 2nd floor [facility] Mall To find out your arrival time please call [phone] between 1PM - 3PM on April 01, 2016 (Wednesday)  Remember: Instructions that are not followed completely may result in serious medical risk, up to and including death, or upon the discretion of your surgeon and anesthesiologist your surgery may need to be rescheduled.    _x___ 1. Do not eat food or drink liquids after midnight. No gum chewing or hard candies.     __x__ 2. No Alcohol for 24 hours before or after surgery.   __x__3. No Smoking for 24 prior to surgery.   ____  4. Bring all medications with you on the day of surgery if instructed.    __x__ 5. Notify your doctor if there is any change in your medical condition     (cold, fever, infections).     Do not wear jewelry, make-up, hairpins, clips or nail polish.  Do not wear lotions, powders, or perfumes. You may wear deodorant.  Do not shave 48 hours prior to surgery. Men may shave face and neck.  Do not bring valuables to the hospital.    [facility] is not responsible for any belongings or valuables.               Contacts, dentures or bridgework may not be worn into surgery.  Leave your suitcase in the car. After surgery it may be brought to your room.  For patients admitted to the hospital, discharge time is determined by your treatment team.   Patients discharged the day of surgery will not be allowed to drive home.    Please read over the following fact sheets that you were given:   [facility] Preparing for Surgery and or MRSA Information   _x___ Take these medicines the morning of surgery with A SIP OF WATER:    1.Aciphex (Aciphex the night prior to surgery)   2.Levothyroxine  3.  4.  5.  6.  ____ Fleet Enema (as directed)   _x___ Use CHG Soap or sage wipes as directed on instruction sheet   ____ Use inhalers on the day of surgery and bring to  hospital day of surgery  ____ Stop metformin 2 days prior to surgery    ____ Take 1/2 of usual insulin dose the night before surgery and none on the morning of surgery            __x__ Stop aspirin or coumadin, or plavix (N/A)  _x__ Stop Anti-inflammatories such as Advil, Aleve, Ibuprofen, Motrin, Naproxen,          Naprosyn, Goodies powders or aspirin products. Ok to take Tylenol.   __x__ Stop supplements until after surgery.  (Stop Multi Vitamin until after surgery)  __x__ Bring C-Pap to the hospital.

## 2016-04-02 ENCOUNTER — Encounter: Payer: Self-pay | Admitting: *Deleted

## 2016-04-02 ENCOUNTER — Ambulatory Visit: Payer: Worker's Compensation | Admitting: Anesthesiology

## 2016-04-02 ENCOUNTER — Encounter
Admission: RE | Disposition: A | Discharge status: Home / Self Care | Payer: Self-pay | Source: Ambulatory Visit | Attending: Surgery

## 2016-04-02 ENCOUNTER — Ambulatory Visit
Admission: RE | Admit: 2016-04-02 | Discharge: 2016-04-02 | Disposition: A | Discharge status: Home / Self Care | Payer: Worker's Compensation | Source: Ambulatory Visit | Attending: Surgery | Admitting: Surgery

## 2016-04-02 DIAGNOSIS — F419 Anxiety disorder, unspecified: Secondary | ICD-10-CM | POA: Diagnosis not present

## 2016-04-02 DIAGNOSIS — Z8241 Family history of sudden cardiac death: Secondary | ICD-10-CM | POA: Insufficient documentation

## 2016-04-02 DIAGNOSIS — G4730 Sleep apnea, unspecified: Secondary | ICD-10-CM | POA: Diagnosis not present

## 2016-04-02 DIAGNOSIS — Z833 Family history of diabetes mellitus: Secondary | ICD-10-CM | POA: Diagnosis not present

## 2016-04-02 DIAGNOSIS — Z8379 Family history of other diseases of the digestive system: Secondary | ICD-10-CM | POA: Insufficient documentation

## 2016-04-02 DIAGNOSIS — Z79899 Other long term (current) drug therapy: Secondary | ICD-10-CM | POA: Insufficient documentation

## 2016-04-02 DIAGNOSIS — Z9049 Acquired absence of other specified parts of digestive tract: Secondary | ICD-10-CM | POA: Insufficient documentation

## 2016-04-02 DIAGNOSIS — Z825 Family history of asthma and other chronic lower respiratory diseases: Secondary | ICD-10-CM | POA: Diagnosis not present

## 2016-04-02 DIAGNOSIS — Z881 Allergy status to other antibiotic agents status: Secondary | ICD-10-CM | POA: Diagnosis not present

## 2016-04-02 DIAGNOSIS — Z9889 Other specified postprocedural states: Secondary | ICD-10-CM | POA: Insufficient documentation

## 2016-04-02 DIAGNOSIS — E039 Hypothyroidism, unspecified: Secondary | ICD-10-CM | POA: Insufficient documentation

## 2016-04-02 DIAGNOSIS — Z882 Allergy status to sulfonamides status: Secondary | ICD-10-CM | POA: Diagnosis not present

## 2016-04-02 DIAGNOSIS — M1990 Unspecified osteoarthritis, unspecified site: Secondary | ICD-10-CM | POA: Insufficient documentation

## 2016-04-02 DIAGNOSIS — K219 Gastro-esophageal reflux disease without esophagitis: Secondary | ICD-10-CM | POA: Diagnosis not present

## 2016-04-02 DIAGNOSIS — M75121 Complete rotator cuff tear or rupture of right shoulder, not specified as traumatic: Secondary | ICD-10-CM | POA: Diagnosis not present

## 2016-04-02 DIAGNOSIS — M65811 Other synovitis and tenosynovitis, right shoulder: Secondary | ICD-10-CM | POA: Insufficient documentation

## 2016-04-02 DIAGNOSIS — M7521 Bicipital tendinitis, right shoulder: Secondary | ICD-10-CM | POA: Insufficient documentation

## 2016-04-02 DIAGNOSIS — F329 Major depressive disorder, single episode, unspecified: Secondary | ICD-10-CM | POA: Diagnosis not present

## 2016-04-02 HISTORY — PX: SHOULDER ARTHROSCOPY WITH DEBRIDEMENT AND BICEP TENDON REPAIR: SHX5690

## 2016-04-02 HISTORY — PX: SHOULDER ARTHROSCOPY WITH SUBACROMIAL DECOMPRESSION: SHX5684

## 2016-04-02 HISTORY — PX: SHOULDER ARTHROSCOPY WITH OPEN ROTATOR CUFF REPAIR: SHX6092

## 2016-04-02 HISTORY — PX: BICEPT TENODESIS: SHX5116

## 2016-04-02 LAB — POCT PREGNANCY, URINE: PREG TEST UR: NEGATIVE

## 2016-04-02 SURGERY — ARTHROSCOPY, SHOULDER WITH REPAIR, ROTATOR CUFF, OPEN
Anesthesia: Regional | Site: Shoulder | Laterality: Right

## 2016-04-02 MED ORDER — FENTANYL CITRATE (PF) 100 MCG/2ML IJ SOLN
INTRAMUSCULAR | Status: AC
  Administered 2016-04-02: 50 ug
  Filled 2016-04-02: qty 2

## 2016-04-02 MED ORDER — PHENYLEPHRINE HCL 10 MG/ML IJ SOLN
10.0000 mg | INTRAVENOUS | Status: DC | PRN
  Administered 2016-04-02: 20 ug/min via INTRAVENOUS

## 2016-04-02 MED ORDER — MIDAZOLAM HCL 5 MG/5ML IJ SOLN
1.0000 mg | Freq: Once | INTRAMUSCULAR | Status: AC
  Administered 2016-04-02: 1 mg via INTRAVENOUS
  Filled 2016-04-02: qty 1

## 2016-04-02 MED ORDER — MIDAZOLAM HCL 5 MG/5ML IJ SOLN
INTRAMUSCULAR | Status: AC
  Administered 2016-04-02: 1 mg via INTRAVENOUS
  Filled 2016-04-02: qty 5

## 2016-04-02 MED ORDER — ALFENTANIL 500 MCG/ML IJ INJ
INJECTION | INTRAMUSCULAR | Status: DC | PRN
  Administered 2016-04-02: 500 ug via INTRAVENOUS

## 2016-04-02 MED ORDER — FENTANYL CITRATE (PF) 100 MCG/2ML IJ SOLN: 50.0000 ug | Freq: Once | INTRAMUSCULAR | Status: DC

## 2016-04-02 MED ORDER — EPINEPHRINE HCL 1 MG/ML IJ SOLN
INTRAMUSCULAR | Status: AC
  Filled 2016-04-02: qty 2

## 2016-04-02 MED ORDER — CEFAZOLIN SODIUM-DEXTROSE 2-4 GM/100ML-% IV SOLN
INTRAVENOUS | Status: AC
  Filled 2016-04-02: qty 100

## 2016-04-02 MED ORDER — ONDANSETRON HCL 4 MG PO TABS: 4.0000 mg | ORAL_TABLET | Freq: Four times a day (QID) | ORAL | Status: DC | PRN

## 2016-04-02 MED ORDER — ROPIVACAINE HCL 5 MG/ML IJ SOLN
INTRAMUSCULAR | Status: AC
  Administered 2016-04-02: 30 mL via PERINEURAL
  Filled 2016-04-02: qty 40

## 2016-04-02 MED ORDER — ACETAMINOPHEN 10 MG/ML IV SOLN
INTRAVENOUS | Status: AC
  Filled 2016-04-02: qty 100

## 2016-04-02 MED ORDER — BUPIVACAINE-EPINEPHRINE (PF) 0.5% -1:200000 IJ SOLN
INTRAMUSCULAR | Status: AC
  Filled 2016-04-02: qty 30

## 2016-04-02 MED ORDER — EPHEDRINE SULFATE 50 MG/ML IJ SOLN
INTRAMUSCULAR | Status: DC | PRN
  Administered 2016-04-02: 10 mg via INTRAVENOUS

## 2016-04-02 MED ORDER — FENTANYL CITRATE (PF) 100 MCG/2ML IJ SOLN
INTRAMUSCULAR | Status: DC | PRN
  Administered 2016-04-02: 250 ug via INTRAVENOUS

## 2016-04-02 MED ORDER — METOCLOPRAMIDE HCL 10 MG PO TABS: 5.0000 mg | ORAL_TABLET | Freq: Three times a day (TID) | ORAL | Status: DC | PRN

## 2016-04-02 MED ORDER — LACTATED RINGERS IV SOLN: INTRAVENOUS | Status: DC

## 2016-04-02 MED ORDER — EPINEPHRINE HCL 1 MG/ML IJ SOLN
INTRAMUSCULAR | Status: DC | PRN
  Administered 2016-04-02: 2 mL

## 2016-04-02 MED ORDER — CEFAZOLIN SODIUM-DEXTROSE 2-4 GM/100ML-% IV SOLN
2.0000 g | Freq: Once | INTRAVENOUS | Status: AC
  Administered 2016-04-02: 2 g via INTRAVENOUS

## 2016-04-02 MED ORDER — ROCURONIUM BROMIDE 100 MG/10ML IV SOLN
INTRAVENOUS | Status: DC | PRN
  Administered 2016-04-02: 10 mg via INTRAVENOUS
  Administered 2016-04-02: 5 mg via INTRAVENOUS
  Administered 2016-04-02: 50 mg via INTRAVENOUS
  Administered 2016-04-02: 10 mg via INTRAVENOUS

## 2016-04-02 MED ORDER — ONDANSETRON HCL 4 MG/2ML IJ SOLN: 4.0000 mg | Freq: Four times a day (QID) | INTRAMUSCULAR | Status: DC | PRN

## 2016-04-02 MED ORDER — BUPIVACAINE-EPINEPHRINE 0.5% -1:200000 IJ SOLN
INTRAMUSCULAR | Status: DC | PRN
  Administered 2016-04-02: 30 mL

## 2016-04-02 MED ORDER — FENTANYL CITRATE (PF) 100 MCG/2ML IJ SOLN: 25.0000 ug | INTRAMUSCULAR | Status: DC | PRN

## 2016-04-02 MED ORDER — SUGAMMADEX SODIUM 200 MG/2ML IV SOLN
INTRAVENOUS | Status: DC | PRN
  Administered 2016-04-02: 200 mg via INTRAVENOUS

## 2016-04-02 MED ORDER — DEXAMETHASONE SODIUM PHOSPHATE 4 MG/ML IJ SOLN
INTRAMUSCULAR | Status: DC | PRN
  Administered 2016-04-02: 5 mg via INTRAVENOUS

## 2016-04-02 MED ORDER — PROPOFOL 10 MG/ML IV BOLUS
INTRAVENOUS | Status: DC | PRN
  Administered 2016-04-02: 180 mg via INTRAVENOUS

## 2016-04-02 MED ORDER — METOCLOPRAMIDE HCL 5 MG/ML IJ SOLN: 5.0000 mg | Freq: Three times a day (TID) | INTRAMUSCULAR | Status: DC | PRN

## 2016-04-02 MED ORDER — POTASSIUM CHLORIDE IN NACL 20-0.9 MEQ/L-% IV SOLN: INTRAVENOUS | Status: DC

## 2016-04-02 MED ORDER — ACETAMINOPHEN 10 MG/ML IV SOLN
INTRAVENOUS | Status: DC | PRN
  Administered 2016-04-02: 1000 mg via INTRAVENOUS

## 2016-04-02 MED ORDER — OXYCODONE HCL 5 MG PO TABS: 5.0000 mg | ORAL_TABLET | ORAL | Status: DC | PRN

## 2016-04-02 MED ORDER — LACTATED RINGERS IV SOLN
INTRAVENOUS | Status: DC | PRN
  Administered 2016-04-02: 10:00:00 via INTRAVENOUS

## 2016-04-02 MED ORDER — LIDOCAINE HCL (PF) 1 % IJ SOLN
INTRAMUSCULAR | Status: AC
  Administered 2016-04-02: 5 mL via SUBCUTANEOUS
  Filled 2016-04-02: qty 5

## 2016-04-02 MED ORDER — PROMETHAZINE HCL 25 MG/ML IJ SOLN: 6.2500 mg | INTRAMUSCULAR | Status: DC | PRN

## 2016-04-02 MED ORDER — ONDANSETRON HCL 4 MG/2ML IJ SOLN
INTRAMUSCULAR | Status: AC
  Administered 2016-04-02: 4 mg
  Filled 2016-04-02: qty 2

## 2016-04-02 MED ORDER — LIDOCAINE HCL (CARDIAC) 20 MG/ML IV SOLN
INTRAVENOUS | Status: DC | PRN
  Administered 2016-04-02: 100 mg via INTRAVENOUS

## 2016-04-02 SURGICAL SUPPLY — 46 items
ANCHOR JUGGERKNOT WTAP NDL 2.9 (Anchor) ×16 IMPLANT
ANCHOR SUT QUATTRO KNTLS 4.5 (Anchor) ×8 IMPLANT
BIT DRILL JUGRKNT W/NDL BIT2.9 (DRILL) ×2 IMPLANT
BLADE FULL RADIUS 3.5 (BLADE) ×4 IMPLANT
BLADE SHAVER 4.5X7 STR FR (MISCELLANEOUS) IMPLANT
BUR ACROMIONIZER 4.0 (BURR) ×4 IMPLANT
BUR BR 5.5 WIDE MOUTH (BURR) IMPLANT
CANNULA SHAVER 8MMX76MM (CANNULA) ×4 IMPLANT
CHLORAPREP W/TINT 26ML (MISCELLANEOUS) ×8 IMPLANT
COVER MAYO STAND STRL (DRAPES) ×4 IMPLANT
DRAPE IMP U-DRAPE 54X76 (DRAPES) ×8 IMPLANT
DRILL JUGGERKNOT W/NDL BIT 2.9 (DRILL) ×4
DRSG OPSITE POSTOP 4X8 (GAUZE/BANDAGES/DRESSINGS) ×4 IMPLANT
ELECT REM PT RETURN 9FT ADLT (ELECTROSURGICAL) ×4
ELECTRODE REM PT RTRN 9FT ADLT (ELECTROSURGICAL) ×2 IMPLANT
GAUZE PETRO XEROFOAM 1X8 (MISCELLANEOUS) ×4 IMPLANT
GAUZE SPONGE 4X4 12PLY STRL (GAUZE/BANDAGES/DRESSINGS) ×4 IMPLANT
GLOVE BIO SURGEON STRL SZ7.5 (GLOVE) ×8 IMPLANT
GLOVE BIO SURGEON STRL SZ8 (GLOVE) ×8 IMPLANT
GLOVE BIOGEL PI IND STRL 8 (GLOVE) ×2 IMPLANT
GLOVE BIOGEL PI INDICATOR 8 (GLOVE) ×2
GLOVE INDICATOR 8.0 STRL GRN (GLOVE) ×4 IMPLANT
GOWN STRL REUS W/ TWL LRG LVL3 (GOWN DISPOSABLE) ×4 IMPLANT
GOWN STRL REUS W/ TWL XL LVL3 (GOWN DISPOSABLE) ×2 IMPLANT
GOWN STRL REUS W/TWL LRG LVL3 (GOWN DISPOSABLE) ×4
GOWN STRL REUS W/TWL XL LVL3 (GOWN DISPOSABLE) ×2
GRASPER SUT 15 45D LOW PRO (SUTURE) IMPLANT
IV LACTATED RINGER IRRG 3000ML (IV SOLUTION) ×4
IV LR IRRIG 3000ML ARTHROMATIC (IV SOLUTION) ×4 IMPLANT
MANIFOLD NEPTUNE II (INSTRUMENTS) ×4 IMPLANT
MASK FACE SPIDER DISP (MASK) ×4 IMPLANT
MAT BLUE FLOOR 46X72 FLO (MISCELLANEOUS) ×4 IMPLANT
NEEDLE REVERSE CUT 1/2 CRC (NEEDLE) IMPLANT
PACK ARTHROSCOPY SHOULDER (MISCELLANEOUS) ×4 IMPLANT
SLING ARM LRG DEEP (SOFTGOODS) ×4 IMPLANT
SLING ULTRA II LG (MISCELLANEOUS) ×4 IMPLANT
STAPLER SKIN PROX 35W (STAPLE) ×4 IMPLANT
STRAP SAFETY BODY (MISCELLANEOUS) ×4 IMPLANT
SUT ETHIBOND 0 MO6 C/R (SUTURE) ×4 IMPLANT
SUT VIC AB 2-0 CT1 27 (SUTURE) ×4
SUT VIC AB 2-0 CT1 TAPERPNT 27 (SUTURE) ×4 IMPLANT
TAPE MICROFOAM 4IN (TAPE) ×4 IMPLANT
TUBING ARTHRO INFLOW-ONLY STRL (TUBING) ×4 IMPLANT
TUBING CONNECTING 10 (TUBING) ×3 IMPLANT
TUBING CONNECTING 10' (TUBING) ×1
WAND HAND CNTRL MULTIVAC 90 (MISCELLANEOUS) ×4 IMPLANT

## 2016-04-02 NOTE — Anesthesia Preprocedure Evaluation (Signed)
Anesthesia Evaluation  Patient identified by MRN, date of birth, ID band Patient awake    Reviewed: Allergy & Precautions, H&P , NPO status , Patient's Chart, lab work & pertinent test results, reviewed documented beta blocker date and time   History of Anesthesia Complications Negative for: history of anesthetic complications  Airway Mallampati: III  TM Distance: >3 FB Neck ROM: full    Dental no notable dental hx. (+) Teeth Intact   Pulmonary neg shortness of breath, sleep apnea and Continuous Positive Airway Pressure Ventilation , neg COPD, neg recent URI,    Pulmonary exam normal breath sounds clear to auscultation       Cardiovascular Exercise Tolerance: Good negative cardio ROS Normal cardiovascular exam Rhythm:regular Rate:Normal     Neuro/Psych PSYCHIATRIC DISORDERS (Depression) negative neurological ROS     GI/Hepatic Neg liver ROS, GERD  Medicated,  Endo/Other  neg diabetesHypothyroidism   Renal/GU negative Renal ROS  negative genitourinary   Musculoskeletal   Abdominal   Peds  Hematology negative hematology ROS (+)   Anesthesia Other Findings Past Medical History:   Hypothyroidism                                               History of sinus surgery                                       Comment:3 times   GERD (gastroesophageal reflux disease)                       Depression                                                   Sleep apnea                                                    Comment:use C-PAP   Anxiety                                                      Reproductive/Obstetrics negative OB ROS                             Anesthesia Physical Anesthesia Plan  ASA: II  Anesthesia Plan: General and Regional   Post-op Pain Management: GA combined w/ Regional for post-op pain   Induction:   Airway Management Planned:   Additional Equipment:   Intra-op  Plan:   Post-operative Plan:   Informed Consent: I have reviewed the patients History and Physical, chart, labs and discussed the procedure including the risks, benefits and alternatives for the proposed anesthesia with the patient or authorized representative who has indicated his/her understanding and acceptance.   Dental Advisory Given  Plan Discussed with: Anesthesiologist, CRNA and Surgeon  Anesthesia Plan Comments:         Anesthesia Quick Evaluation

## 2016-04-02 NOTE — Discharge Instructions (Addendum)
Keep dressing dry and intact.  May sponge bathe over incisions after dressing changed on post-op day #4 (Monday).  Cover staples with Band-Aids after drying off. Apply ice frequently to shoulder. Keep shoulder immobilizer on at all times. Follow-up in 10-14 days or as scheduled   AMBULATORY SURGERY  DISCHARGE INSTRUCTIONS   1) The drugs that you were given will stay in your system until tomorrow so for the next 24 hours you should not:  A) Drive an automobile B) Make any legal decisions C) Drink any alcoholic beverage   2) You may resume regular meals tomorrow.  Today it is better to start with liquids and gradually work up to solid foods.  You may eat anything you prefer, but it is better to start with liquids, then soup and crackers, and gradually work up to solid foods.   3) Please notify your doctor immediately if you have any unusual bleeding, trouble breathing, redness and pain at the surgery site, drainage, fever, or pain not relieved by medication.    4) Additional Instructions:   Please contact your physician with any problems or Same Day Surgery at [phone], Monday through Friday 6 am to 4 pm, or [place] at [facility] number at [phone].

## 2016-04-02 NOTE — Op Note (Signed)
04/02/2016  12:48 PM  Patient:   Sherry Evans  Pre-Op Diagnosis:   Massive full-thickness rotator cuff tear with biceps tendinopathy, right shoulder.  Postoperative diagnosis: Massive full-thickness rotator cuff tear with biceps tendinopathy and labral fraying, right shoulder.  Procedure: Limited arthroscopic debridement, arthroscopic subacromial decompression, mini-open rotator cuff repair, and mini-open biceps tenodesis, right shoulder.  Anesthesia: General endotracheal with interscalene block placed preoperatively by the anesthesiologist.  Surgeon:   Pascal Lux, MD  Assistant:   Cameron Proud, PA-C  Findings: As above. There was mild fraying of the labrum anteriorly and superiorly with some synovitis. The subscapularis tendon and teres minor tendons both were in satisfactory condition. The articular surfaces of the glenoid and humerus both were in satisfactory condition. Overall, the biceps tendon was in satisfactory condition as well.  Complications: None  Fluids:   900 cc  Estimated blood loss: 5 cc  Tourniquet time: None  Drains: None  Closure: Staples   Brief clinical note: The patient is a 55 year old female with a history of right shoulder pain. The patient's symptoms have progressed despite medications, activity modification, etc. The patient's history and examination are consistent with impingement/tendinopathy with a rotator cuff tear. These findings were confirmed by MRI scan. The patient presents at this time for definitive management of these shoulder symptoms.  Procedure: The patient underwent placement of an interscalene block by the anesthesiologist in the preoperative holding area before she was brought into the operating room and lain in the supine position. The patient then underwent general endotracheal intubation and anesthesia before being repositioned in the beach chair position using the beach chair positioner. The right  shoulder and upper extremity were prepped with ChloraPrep solution before being draped sterilely. Preoperative antibiotics were administered. A timeout was performed to confirm the proper surgical site before the expected portal sites and incision site were injected with 0.5% Sensorcaine with epinephrine. A posterior portal was created and the glenohumeral joint thoroughly inspected with the findings as described above. An anterior portal was created using an outside-in technique. The labrum and rotator cuff were further probed, again confirming the above-noted findings. The areas of labral fraying and synovitis were debrided back to stable margins using the full-radius resector. The ArthroCare wand was inserted and used to obtain hemostasis as well as to "anneal" the labrum superiorly and anteriorly. The instruments were removed from the joint after suctioning the excess fluid.  The camera was repositioned through the posterior portal into the subacromial space. A separate lateral portal was created using an outside-in technique. The 3.5 mm full-radius resector was introduced and used to perform a subtotal bursectomy. The ArthroCare wand was then inserted and used to remove the periosteal tissue off the undersurface of the anterior third of the acromion as well as to recess the coracoacromial ligament from its attachment along the anterior and lateral margins of the acromion. The 4.0 mm acromionizing bur was introduced and used to complete the decompression by removing the undersurface of the anterior third of the acromion. The full radius resector was reintroduced to remove any residual bony debris before the ArthroCare wand was reintroduced to obtain hemostasis. The instruments were then removed from the subacromial space after suctioning the excess fluid.  An approximately 4-5 cm incision was made over the anterolateral aspect of the shoulder beginning at the anterolateral corner of the acromion and  extending distally in line with the bicipital groove. This incision was carried down through the subcutaneous tissues to expose the deltoid fascia. The  raphae between the anterior and middle thirds was identified and this plane developed to provide access into the subacromial space. Additional bursal tissues were debrided sharply using Metzenbaum scissors. The rotator cuff tear was readily identified. The margins were debrided sharply with a #15 blade and the exposed greater tuberosity roughened with a rongeur. Given the poor quality of the tissue, especially anteriorly, it was felt best to use the biceps tendon to augment the repair. Therefore, the bicipital groove was identified by palpation and opened for 1-1.5 cm. The biceps tendon was identified through this defect and the tendon transected at the midportion of the groove. The floor of the bicipital groove was roughened with a curet before a Biomet 2.9 mm JuggerKnot anchor was inserted. Both sets of sutures were passed through the distal portion of the biceps tendon and tied securely to effect the tenodesis. The bicipital sheath was reapproximated using two #0 Ethibond interrupted sutures, incorporating the biceps tendon to further reinforce the tenodesis.  The proximal portion of the biceps tendon was retrieved and secured to the anterior portion of the greater tuberosity using a second Biomet 2.9 mm JuggerKnot anchor. Several tagging sutures were placed through the posterior portion of the torn rotator cuff margin and used to identify the appropriate angle. Two additional Biomet 2.9 mm JuggerKnot anchors were placed along the articular margin. Both sets of sutures from each anchor placed through the lateral portion of the torn rotator cuff and tied securely. The longitudinal portion of the tear was repaired using several #0 Ethibond interrupted sutures placed in a side to side fashion to the biceps tendon. Several additional #0 Ethibond interrupted sutures  were used to reapproximate the anterior portion of the biceps tendon to the superior most portion of the subscapularis tendon, thereby partially closing the rotator interval. The sutures from the two laterally placed juggernaut anchors were pulled laterally over the remaining portion of the tendon and secured laterally utilizing two [place] QuatroLink anchors to complete a double row closure. An apparent watertight closure was obtained.  The wound was copiously irrigated with sterile saline solution before the deltoid raphae was reapproximated using 2-0 Vicryl interrupted sutures. The subcutaneous tissues were closed in two layers using 2-0 Vicryl interrupted sutures before the skin was closed using staples. The portal sites also were closed using staples. A sterile bulky dressing was applied to the shoulder before the arm was placed into a shoulder immobilizer. The patient was then awakened, extubated, and returned to the recovery room in satisfactory condition after tolerating the procedure well.

## 2016-04-02 NOTE — Anesthesia Postprocedure Evaluation (Signed)
Anesthesia Post Note  Patient: Sherry Evans  Procedure(s) Performed: Procedure(s) (LRB): SHOULDER ARTHROSCOPY WITH OPEN ROTATOR CUFF REPAIR (Right) BICEPS TENODESIS (Right) SHOULDER ARTHROSCOPY WITH SUBACROMIAL DECOMPRESSION (Right) SHOULDER ARTHROSCOPY WITH DEBRIDEMENT  (Right)  Patient location during evaluation: PACU Anesthesia Type: General Level of consciousness: awake and alert Pain management: pain level controlled Vital Signs Assessment: post-procedure vital signs reviewed and stable Respiratory status: spontaneous breathing, nonlabored ventilation, respiratory function stable and patient connected to nasal cannula oxygen Cardiovascular status: blood pressure returned to baseline and stable Postop Assessment: no signs of nausea or vomiting Anesthetic complications: no    Last Vitals:  Filed Vitals:   04/02/16 1340 04/02/16 1410  BP: 135/78 118/55  Pulse:  73  Temp: 36.3 C 36.7 C  Resp:  16    Last Pain: There were no vitals filed for this visit.               Martha Clan

## 2016-04-02 NOTE — Anesthesia Procedure Notes (Addendum)
Anesthesia Regional Block:  Interscalene brachial plexus block  Pre-Anesthetic Checklist: ,, timeout performed, Correct Patient, Correct Site, Correct Laterality, Correct Procedure, Correct Position, site marked, Risks and benefits discussed,  Surgical consent,  Pre-op evaluation,  At surgeon's request and post-op pain management  Laterality: Right and Upper  Prep: chloraprep and alcohol swabs       Needles:  Injection technique: Single-shot  Needle Type: Stimiplex     Needle Length: 10cm 10 cm Needle Gauge: 22 and 22 G    Additional Needles:  Procedures: ultrasound guided (picture in chart) Interscalene brachial plexus block Narrative:  Start time: 04/02/2016 9:47 AM End time: 04/02/2016 9:50 AM Injection made incrementally with aspirations every 5 mL.  Performed by: Personally  Anesthesiologist: Martha Clan  Additional Notes: Functioning IV was confirmed and monitors were applied.  A 78mm 22ga Stimuplex needle was used. Sterile prep and drape,hand hygiene and sterile gloves were used.  Negative aspiration and negative test dose prior to incremental administration of local anesthetic. The patient tolerated the procedure well.      Procedure Name: Intubation Performed by: Rosaria Ferries, Nysa Sarin Pre-anesthesia Checklist: Patient identified, Emergency Drugs available, Suction available and Patient being monitored Patient Re-evaluated:Patient Re-evaluated prior to inductionOxygen Delivery Method: Circle system utilized Preoxygenation: Pre-oxygenation with 100% oxygen Intubation Type: IV induction Laryngoscope Size: Mac and 3 Grade View: Grade I Tube size: 7.0 mm Number of attempts: 1 Placement Confirmation: ETT inserted through vocal cords under direct vision,  positive ETCO2 and breath sounds checked- equal and bilateral Secured at: 22 cm Tube secured with: Tape Dental Injury: Teeth and Oropharynx as per pre-operative assessment

## 2016-04-02 NOTE — H&P (Signed)
Paper H&P to be scanned into permanent record. H&P reviewed. No changes. 

## 2016-04-02 NOTE — Transfer of Care (Signed)
Immediate Anesthesia Transfer of Care Note  Patient: Sherry Evans  Procedure(s) Performed: Procedure(s): SHOULDER ARTHROSCOPY WITH OPEN ROTATOR CUFF REPAIR (Right) BICEPS TENODESIS (Right) SHOULDER ARTHROSCOPY WITH SUBACROMIAL DECOMPRESSION (Right) SHOULDER ARTHROSCOPY WITH DEBRIDEMENT  (Right)  Patient Location: PACU  Anesthesia Type:General  Level of Consciousness: awake and patient cooperative  Airway & Oxygen Therapy: Patient Spontanous Breathing and Patient connected to nasal cannula oxygen  Post-op Assessment: Report given to RN and Post -op Vital signs reviewed and stable  Post vital signs: Reviewed and stable  Last Vitals:  Filed Vitals:   04/02/16 1011 04/02/16 1021  BP: 120/70 120/68  Pulse: 61 57  Temp:    Resp: 15 15    Last Pain: There were no vitals filed for this visit.       Complications: No apparent anesthesia complications

## 2016-04-03 ENCOUNTER — Encounter: Payer: Self-pay | Admitting: Surgery

## 2016-06-08 ENCOUNTER — Encounter: Payer: Self-pay | Admitting: Unknown Physician Specialty

## 2016-08-16 IMAGING — US US BREAST LTD UNI LEFT INC AXILLA
1 series · 13 of 18 positions shown · non-contrast
Comparison: 09/12/2014 08/31/2013, additional prior studies dating
back to 09/13/2001

ACR Breast Density Category a: The breast tissue is almost entirely
fatty.

CLINICAL DATA: 54-year-old female, callback from screening
mammogram for left breast mass. The patient reports recent weight
loss surgery. She denies any chronic medical illnesses.

EXAM:
DIGITAL DIAGNOSTIC LEFT MAMMOGRAM WITH 3D TOMOSYNTHESIS WITH CAD
ULTRASOUND LEFT BREAST

[Series 1: us breast ltd uni left inc axilla · 0.06mm/px · 13 of 18 slices shown]
[im 1/18]
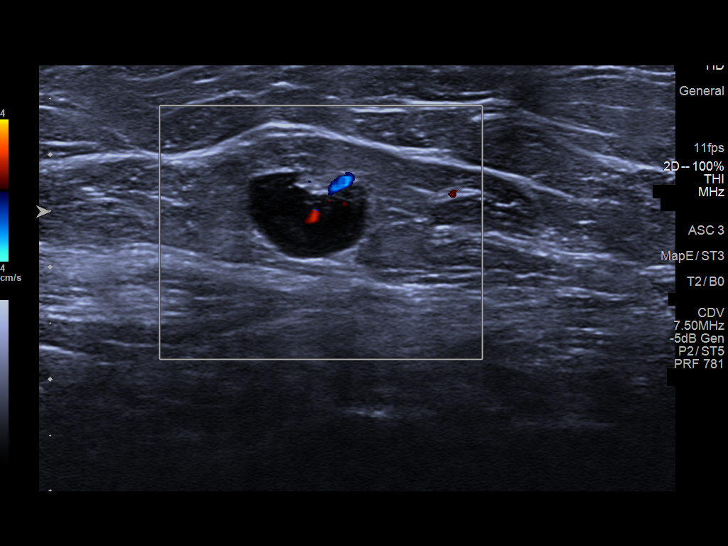
[im 3/18]
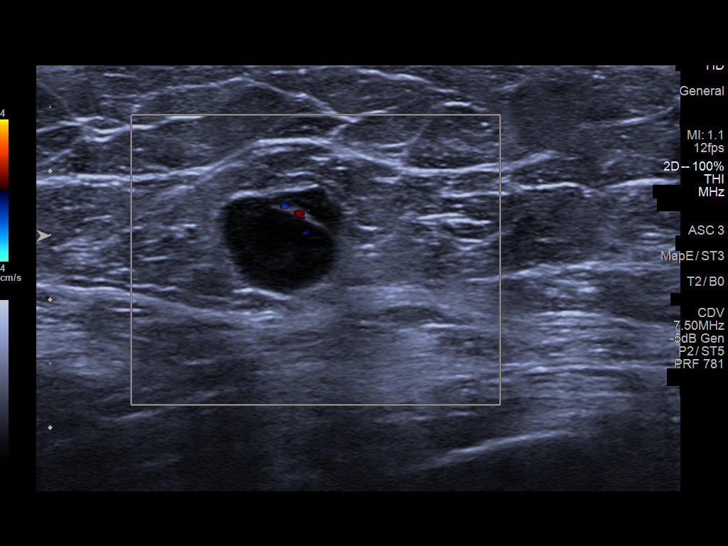
[im 4/18]
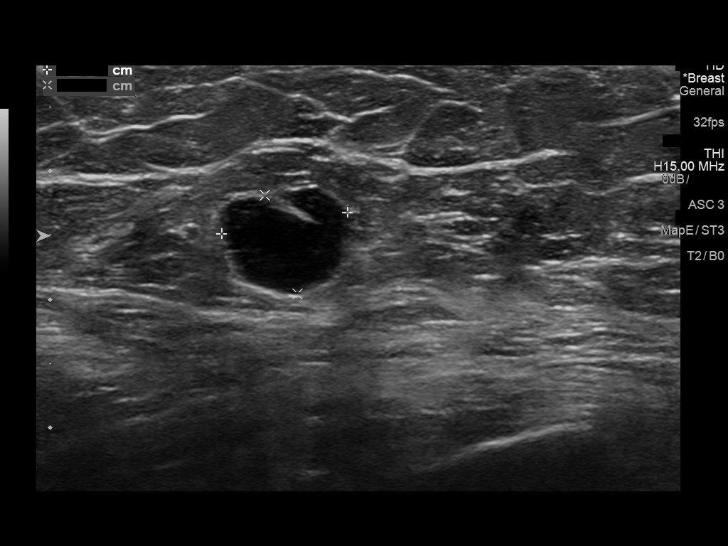
[im 5/18]
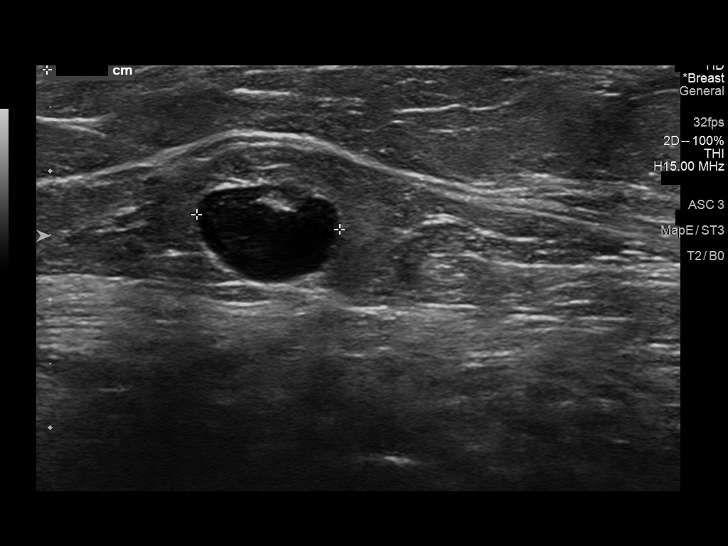
[im 7/18]
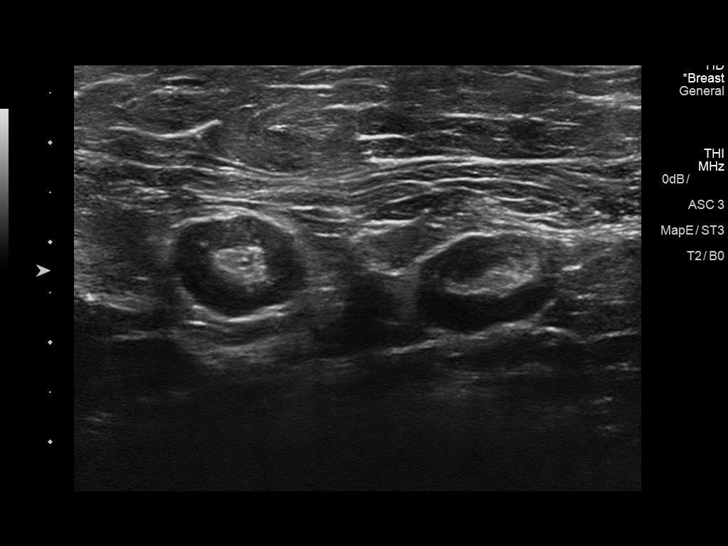
[im 8/18]
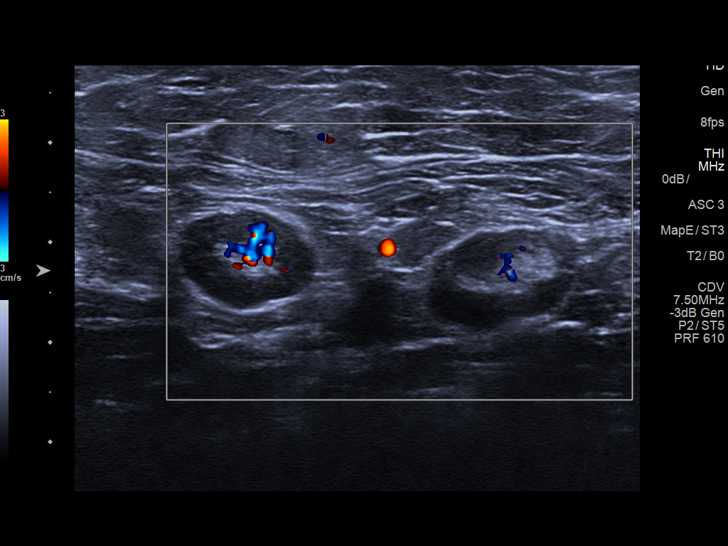
[im 10/18]
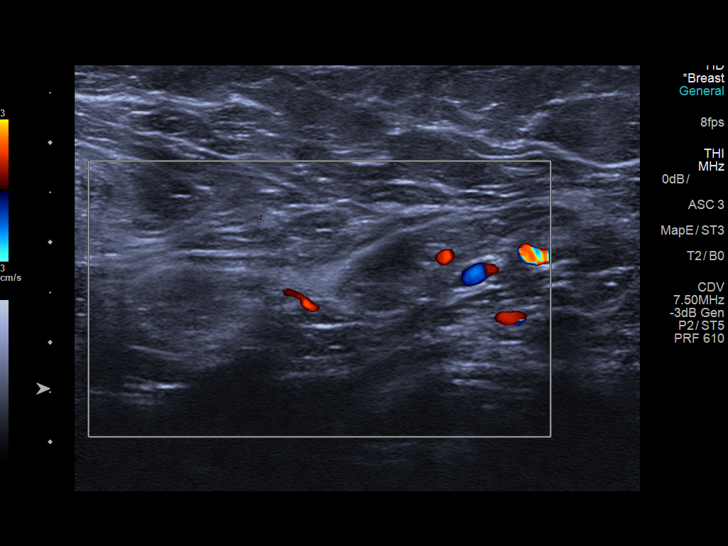
[im 11/18]
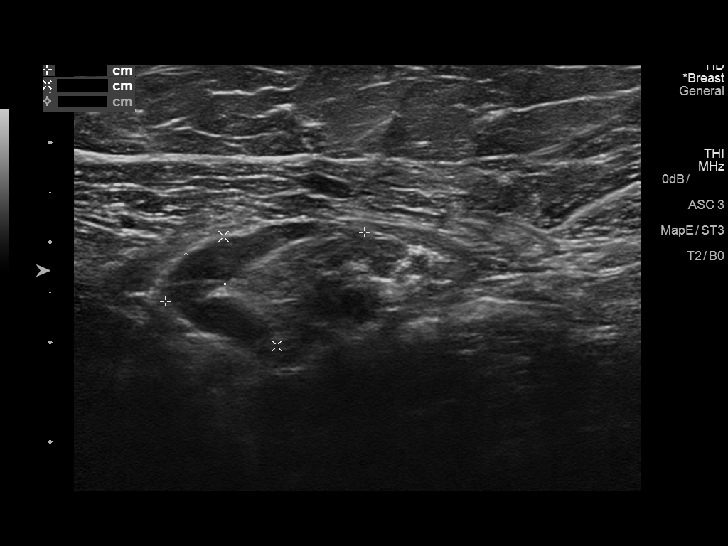
[im 12/18]
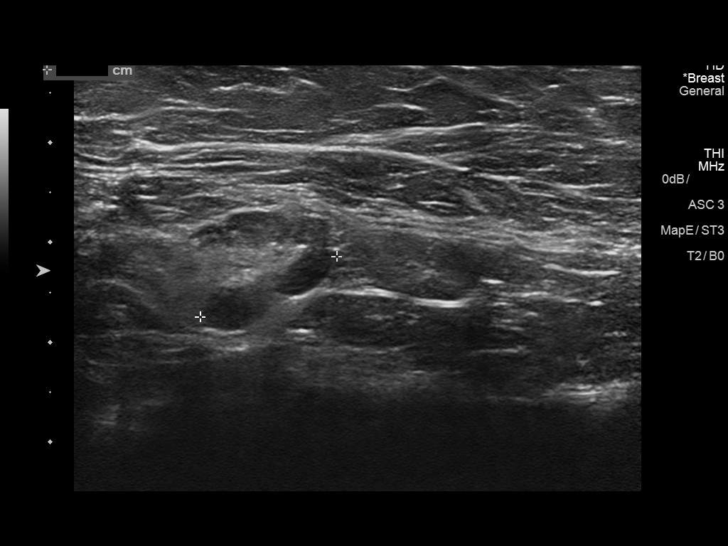
[im 14/18]
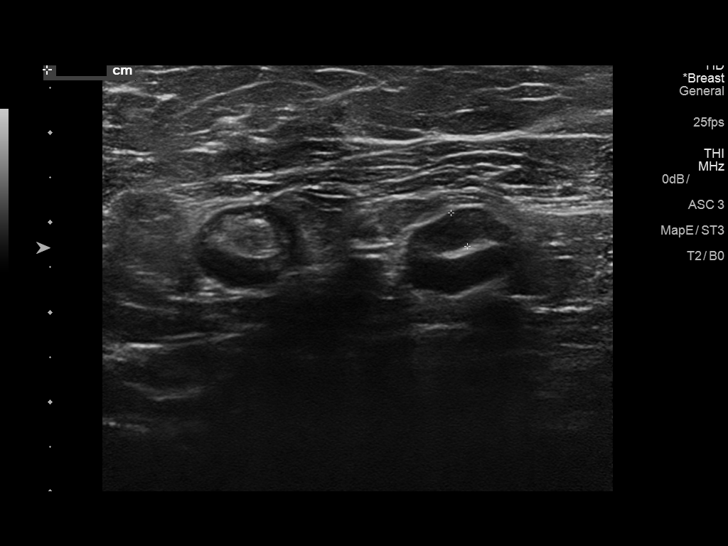
[im 15/18]
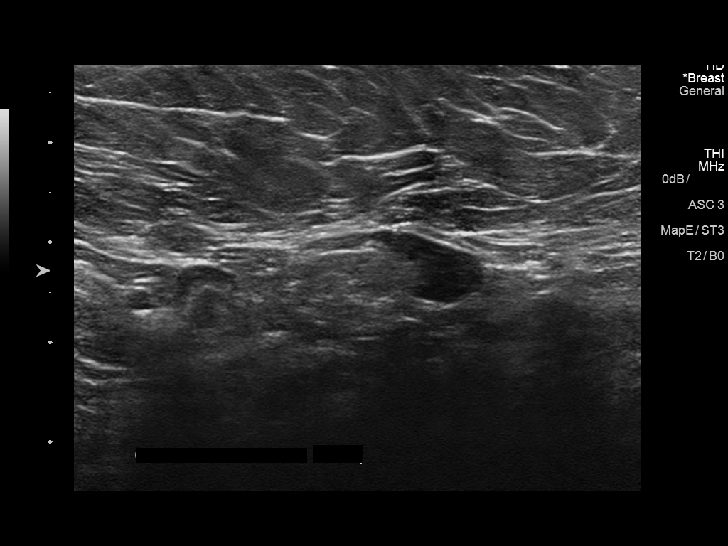
[im 16/18]
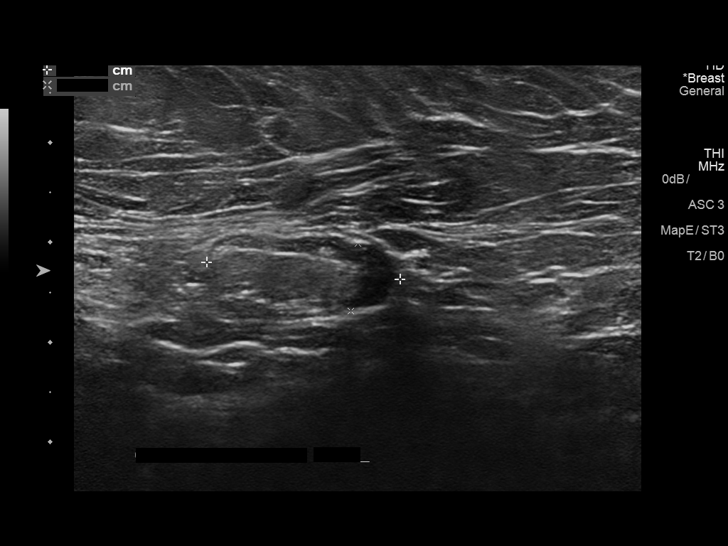
[im 18/18]
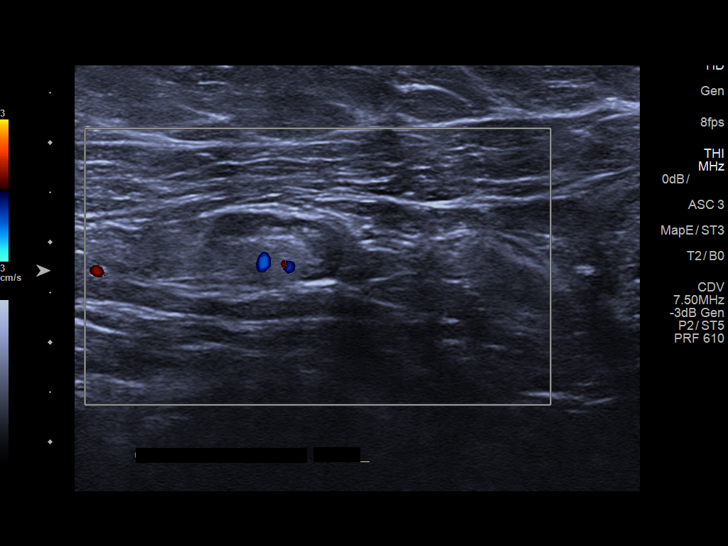

[13 of 18 positions shown; findings below may reference images not displayed]

FINDINGS: CC, XCC, and MLO spot-compression views of the left breast were
performed. On the additional views, the possible mass identified on
screening mammogram persists and appears to represent an enlarged
lymph node. A prominent lymph node is also noted within the left
axilla.

Mammographic images were processed with CAD.

Targeted ultrasound is performed showing an enlarged intramammary
lymph node at 2 o'clock, 16 cm from the nipple measuring 10 x 8 x 6
mm. Several enlarged lymph nodes with diffuse cortical thickening
are also noted within the left axilla. Targeted ultrasound of the
right axilla was performed for comparison purposes demonstrating no
definite enlarged lymph nodes.
IMPRESSION: Enlarged left intramammary and left axillary lymph nodes.

RECOMMENDATION:
Ultrasound-guided biopsy of the left intramammary lymph node.

I have discussed the findings and recommendations with the patient.
Results were also provided in writing at the conclusion of the
visit. If applicable, a reminder letter will be sent to the patient
regarding the next appointment.

BI-RADS CATEGORY  4: Suspicious.

## 2016-10-28 ENCOUNTER — Encounter: Payer: Self-pay | Admitting: Obstetrics and Gynecology

## 2016-10-28 ENCOUNTER — Ambulatory Visit (INDEPENDENT_AMBULATORY_CARE_PROVIDER_SITE_OTHER): Payer: BC Managed Care – PPO | Admitting: Obstetrics and Gynecology

## 2016-10-28 VITALS — BP 119/79 | HR 89 | Ht 64.0 in | Wt 202.3 lb

## 2016-10-28 DIAGNOSIS — N952 Postmenopausal atrophic vaginitis: Secondary | ICD-10-CM | POA: Diagnosis not present

## 2016-10-28 DIAGNOSIS — E66811 Obesity, class 1: Secondary | ICD-10-CM

## 2016-10-28 DIAGNOSIS — Z1231 Encounter for screening mammogram for malignant neoplasm of breast: Secondary | ICD-10-CM | POA: Diagnosis not present

## 2016-10-28 DIAGNOSIS — Z780 Asymptomatic menopausal state: Secondary | ICD-10-CM | POA: Diagnosis not present

## 2016-10-28 DIAGNOSIS — Z1239 Encounter for other screening for malignant neoplasm of breast: Secondary | ICD-10-CM

## 2016-10-28 DIAGNOSIS — B379 Candidiasis, unspecified: Secondary | ICD-10-CM

## 2016-10-28 DIAGNOSIS — E669 Obesity, unspecified: Secondary | ICD-10-CM | POA: Diagnosis not present

## 2016-10-28 DIAGNOSIS — E039 Hypothyroidism, unspecified: Secondary | ICD-10-CM | POA: Diagnosis not present

## 2016-10-28 DIAGNOSIS — Z01419 Encounter for gynecological examination (general) (routine) without abnormal findings: Secondary | ICD-10-CM | POA: Diagnosis not present

## 2016-10-28 DIAGNOSIS — Z1211 Encounter for screening for malignant neoplasm of colon: Secondary | ICD-10-CM

## 2016-10-28 DIAGNOSIS — N941 Unspecified dyspareunia: Secondary | ICD-10-CM

## 2016-10-28 MED ORDER — ESTROGENS, CONJUGATED 0.625 MG/GM VA CREA: 0.2500 | TOPICAL_CREAM | VAGINAL | 12 refills | Status: DC

## 2016-10-28 MED ORDER — NYSTATIN-TRIAMCINOLONE 100000-0.1 UNIT/GM-% EX OINT: 1.0000 "application " | TOPICAL_OINTMENT | Freq: Two times a day (BID) | CUTANEOUS | 2 refills | Status: DC | PRN

## 2016-10-28 NOTE — Progress Notes (Signed)
ANNUAL PREVENTATIVE CARE GYN  ENCOUNTER NOTE  Subjective:       Sherry Evans is a 56 y.o. G60P1021 female here for a routine annual gynecologic exam.  Current complaints: 1. Dyspareunia  2. History of yeast infections under breast and belly 3. Menopause -Stephenson testing last year was 41.7, consistent with menopause  Sherry Evans is here to establish care and have her annual exam. Her last menstrual period was 12/21/2015 and has had no spotting or bleeding since then. She is married and having some pain and vaginal tearing with intercourse.   She has a family history of breast cancer and recently had a lump biopsied and cleared by Dr. Bary Castilla.   Gynecologic History Patient's last menstrual period was 12/21/2015 (approximate). Contraception: condoms Last Pap: Jan 2017. Results were: normal Last mammogram: 2017. Breast bx- Dr Bary Castilla  Results were: birad 2  Obstetric History OB History  Gravida Para Term Preterm AB Living  3 1 1   2 1   SAB TAB Ectopic Multiple Live Births  2   0   1    # Outcome Date GA Lbr Len/2nd Weight Sex Delivery Anes PTL Lv  3 Term 1990   8 lb 9.6 oz (3.901 kg) F Vag-Spont   LIV  2 SAB           1 SAB             Obstetric Comments  1st Menstrual Cycle: 12  1st Pregnancy:  27    Past Medical History:  Diagnosis Date  . Anxiety   . Depression   . GERD (gastroesophageal reflux disease)   . History of sinus surgery    3 times  . Hypothyroidism   . Sleep apnea    use C-PAP    Past Surgical History:  Procedure Laterality Date  . adnoids removed  1969  . BACK SURGERY  2008  . BICEPT TENODESIS Right 04/02/2016   Procedure: BICEPS TENODESIS;  Surgeon: Corky Mull, MD;  Location: [facility];  Service: Orthopedics;  Laterality: Right;  . BLADDER SUSPENSION  2009  . BREAST BIOPSY Left 1990's   benign  . BREAST BIOPSY  October 28, 2015   SMALL FRAGMENTS OF LYMPHOID TISSUE  . CHOLECYSTECTOMY    . COLONOSCOPY    . DILATION AND CURETTAGE OF UTERUS    . GASTRIC  RESTRICTION SURGERY  08/23/15  . HERNIA REPAIR  2016   hiatal  . JOINT REPLACEMENT Bilateral 2014   Partial Knee Replacement,  Surgeon: Dr. Little Ishikawa, Brooke Bonito. [place] [facility]ERY  2014  . SHOULDER ARTHROSCOPY WITH DEBRIDEMENT AND BICEP TENDON REPAIR Right 04/02/2016   Procedure: SHOULDER ARTHROSCOPY WITH DEBRIDEMENT ;  Surgeon: Corky Mull, MD;  Location: [facility];  Service: Orthopedics;  Laterality: Right;  . SHOULDER ARTHROSCOPY WITH OPEN ROTATOR CUFF REPAIR Right 04/02/2016   Procedure: SHOULDER ARTHROSCOPY WITH OPEN ROTATOR CUFF REPAIR;  Surgeon: Corky Mull, MD;  Location: [facility];  Service: Orthopedics;  Laterality: Right;  . SHOULDER ARTHROSCOPY WITH SUBACROMIAL DECOMPRESSION Right 04/02/2016   Procedure: SHOULDER ARTHROSCOPY WITH SUBACROMIAL DECOMPRESSION;  Surgeon: Corky Mull, MD;  Location: [facility];  Service: Orthopedics;  Laterality: Right;    Current Outpatient Prescriptions on File Prior to Visit  Medication Sig Dispense Refill  . azelastine (ASTELIN) 0.1 % nasal spray Place 2 sprays into both nostrils 2 (two) times daily as needed for allergies. Use in each nostril as directed    . budesonide (PULMICORT) 0.5  MG/2ML nebulizer solution Take 0.5 mg by nebulization 2 (two) times daily as needed.    . cetirizine (ZYRTEC) 10 MG tablet Take 10 mg by mouth daily.    . Cholecalciferol (VITAMIN D3 SUPER STRENGTH) 2000 units TABS Take 2,000 Units by mouth daily.    Marland Kitchen gabapentin (NEURONTIN) 600 MG tablet TAKE 2 TABLETS BY MOUTH WITH EVENING MEAL    . levothyroxine (SYNTHROID, LEVOTHROID) 100 MCG tablet Take 100 mcg by mouth daily before breakfast.     . montelukast (SINGULAIR) 10 MG tablet Take 10 mg by mouth at bedtime.     . Multiple Vitamins-Calcium (VIACTIV MULTI-VITAMIN) CHEW Chew 1 tablet by mouth daily.    . Multiple Vitamins-Minerals (ADVANCED MULTI EA PO) Take 1 tablet by mouth daily. bariartric advantage    . traZODone (DESYREL) 50 MG tablet Take 50 mg by mouth at  bedtime as needed for sleep.     No current facility-administered medications on file prior to visit.     Allergies  Allergen Reactions  . Azithromycin Itching and Rash  . Sulfa Antibiotics Rash    Social History   Social History  . Marital status: Married    Spouse name: N/A  . Number of children: N/A  . Years of education: N/A   Occupational History  . Not on file.   Social History Main Topics  . Smoking status: Never Smoker  . Smokeless tobacco: Never Used  . Alcohol use No  . Drug use: No  . Sexual activity: Not on file   Other Topics Concern  . Not on file   Social History Narrative  . No narrative on file    Family History  Problem Relation Age of Onset  . Hypertension Father   . Diabetes Father   . Breast cancer Maternal Aunt     great aunts. 70-80  . Breast cancer Maternal Grandmother 10    metatastic to breast  . Ovarian cancer Maternal Grandmother   . Breast cancer Paternal Grandmother     9's  . Colon cancer Neg Hx     The following portions of the patient's history were reviewed and updated as appropriate: allergies, current medications, past family history, past medical history, past social history, past surgical history and problem list.  Review of Systems ROS Review of Systems - General ROS: negative for - chills, fatigue, fever, hot flashes, night sweats, weight gain or weight loss Psychological ROS: negative for - anxiety, decreased libido, depression, mood swings, physical abuse or sexual abuse Ophthalmic ROS: negative for - blurry vision, eye pain or loss of vision ENT ROS: negative for - headaches, hearing change, visual changes or vocal changes Allergy and Immunology ROS: negative for - hives, itchy/watery eyes or seasonal allergies Hematological and Lymphatic ROS: negative for - bleeding problems, bruising, swollen lymph nodes or weight loss Endocrine ROS: negative for - galactorrhea, hair pattern changes, hot flashes,  malaise/lethargy, mood swings, palpitations, polydipsia/polyuria, skin changes, temperature intolerance or unexpected weight changes Breast ROS: negative for - new or changing breast lumps or nipple discharge Respiratory ROS: negative for - cough or shortness of breath Cardiovascular ROS: negative for - chest pain, irregular heartbeat, palpitations or shortness of breath Gastrointestinal ROS: no abdominal pain, change in bowel habits, or black or bloody stools Genito-Urinary ROS: Mild vaginal sensitivity and tearing with intercourse Musculoskeletal ROS: negative for - joint pain or joint stiffness Neurological ROS: negative for - bowel and bladder control changes Dermatological ROS: negative for rash and skin lesion changes  Objective:   BP 119/79   Pulse 89   Ht 5\' 4"  (1.626 m)   Wt 202 lb 4.8 oz (91.8 kg)   LMP 12/21/2015 (Approximate)   BMI 34.72 kg/m  CONSTITUTIONAL: Well-developed, well-nourished female in no acute distress.  PSYCHIATRIC: Normal mood and affect. Normal behavior. Normal judgment and thought content. [place]: Alert and oriented to person, place, and time. Normal muscle tone coordination. No cranial nerve deficit noted. HENT:  Normocephalic, atraumatic, External right and left ear normal. Oropharynx is clear and moist EYES: Conjunctivae and EOM are normal. Pupils are equal, round, and reactive to light. No scleral icterus.  NECK: Normal range of motion, supple, no masses.  Normal thyroid.  SKIN: Skin is warm and dry. No rash noted. Not diaphoretic. No erythema. No pallor. CARDIOVASCULAR: Normal heart rate noted, regular rhythm, no murmur. RESPIRATORY: Clear to auscultation bilaterally. Effort and breath sounds normal, no problems with respiration noted. BREASTS: Symmetric in size. No masses, skin changes, nipple drainage, or lymphadenopathy. ABDOMEN: Soft, normal bowel sounds, no distention noted.  No tenderness, rebound or guarding.  BLADDER:  Normal PELVIC:  External Genitalia: Normal  BUS: Normal  Vagina: Mild atrophy With decreased rugae   Cervix: Mild atrophic changes with petechia   Uterus: Normal; midplane, normal size and shape, mobile, nontender  Adnexa: Normal; nonpalpable nontender  RV: External Exam NormaI, No Rectal Masses and Normal Sphincter tone  MUSCULOSKELETAL: Normal range of motion. No tenderness.  No cyanosis, clubbing, or edema.  2+ distal pulses. LYMPHATIC: No Axillary, Supraclavicular, or Inguinal Adenopathy.    Assessment:   1. Annual gynecologic examination 56 y.o. 2. Contraception: condoms 3. Obesity 2 4. Vaginal atrophy 5. Menopause 6. Dyspareunia  Plan:  1. Pap smear is performed 2. Mammogram is ordered 3. Stool guaiac cards are given for colon cancer screening 4. Screening labs are to be done through primary care 5. Trial of Premarin cream intravaginal twice a week for vaginal dryness and painful intercourse 6. Recommend lubricants as needed for painful intercourse including:  Astroglide  K-Y jelly  JO H2O lubricant  Virgin olive oil  Coconut oil  7. No need for further condom use 8. Continue with healthy eating and exercise with controlled weight loss 9. Continue with calcium supplementation twice a for osteoporosis prevention 10. Return in 1 year or as needed  Janeece Riggers, PA-S

## 2016-10-28 NOTE — Patient Instructions (Signed)
1. Pap smear is performed 2. Mammogram is ordered 3. Stool guaiac cards are given for colon cancer screening 4. Screening labs are to be done through primary care 5. Trial of Premarin cream intravaginal twice a week for vaginal dryness and painful intercourse 6. Recommend lubricants as needed for painful intercourse including:  Astroglide  K-Y jelly  JO H2O lubricant  Virgin olive oil  Coconut oil  7. No need for further condom use 8. Continue with healthy eating and exercise with controlled weight loss 9. Continue with calcium supplementation twice a for osteoporosis prevention 10. Return in 1 year or as needed   Health Maintenance for Postmenopausal Women Introduction Menopause is a normal process in which your reproductive ability comes to an end. This process happens gradually over a span of months to years, usually between the ages of 34 and 65. Menopause is complete when you have missed 12 consecutive menstrual periods. It is important to talk with your health care provider about some of the most common conditions that affect postmenopausal women, such as heart disease, cancer, and bone loss (osteoporosis). Adopting a healthy lifestyle and getting preventive care can help to promote your health and wellness. Those actions can also lower your chances of developing some of these common conditions. What should I know about menopause? During menopause, you may experience a number of symptoms, such as:  Moderate-to-severe hot flashes.  Night sweats.  Decrease in sex drive.  Mood swings.  Headaches.  Tiredness.  Irritability.  Memory problems.  Insomnia. Choosing to treat or not to treat menopausal changes is an individual decision that you make with your health care provider. What should I know about hormone replacement therapy and supplements? Hormone therapy products are effective for treating symptoms that are associated with menopause, such as hot flashes and night  sweats. Hormone replacement carries certain risks, especially as you become older. If you are thinking about using estrogen or estrogen with progestin treatments, discuss the benefits and risks with your health care provider. What should I know about heart disease and stroke? Heart disease, heart attack, and stroke become more likely as you age. This may be due, in part, to the hormonal changes that your body experiences during menopause. These can affect how your body processes dietary fats, triglycerides, and cholesterol. Heart attack and stroke are both medical emergencies. There are many things that you can do to help prevent heart disease and stroke:  Have your blood pressure checked at least every 1-2 years. High blood pressure causes heart disease and increases the risk of stroke.  If you are 19-73 years old, ask your health care provider if you should take aspirin to prevent a heart attack or a stroke.  Do not use any tobacco products, including cigarettes, chewing tobacco, or electronic cigarettes. If you need help quitting, ask your health care provider.  It is important to eat a healthy diet and maintain a healthy weight.  Be sure to include plenty of vegetables, fruits, low-fat dairy products, and lean protein.  Avoid eating foods that are high in solid fats, added sugars, or salt (sodium).  Get regular exercise. This is one of the most important things that you can do for your health.  Try to exercise for at least 150 minutes each week. The type of exercise that you do should increase your heart rate and make you sweat. This is known as moderate-intensity exercise.  Try to do strengthening exercises at least twice each week. Do these in addition  to the moderate-intensity exercise.  Know your numbers.Ask your health care provider to check your cholesterol and your blood glucose. Continue to have your blood tested as directed by your health care provider. What should I know about  cancer screening? There are several types of cancer. Take the following steps to reduce your risk and to catch any cancer development as early as possible. Breast Cancer  Practice breast self-awareness.  This means understanding how your breasts normally appear and feel.  It also means doing regular breast self-exams. Let your health care provider know about any changes, no matter how small.  If you are 78 or older, have a clinician do a breast exam (clinical breast exam or CBE) every year. Depending on your age, family history, and medical history, it may be recommended that you also have a yearly breast X-ray (mammogram).  If you have a family history of breast cancer, talk with your health care provider about genetic screening.  If you are at high risk for breast cancer, talk with your health care provider about having an MRI and a mammogram every year.  Breast cancer (BRCA) gene test is recommended for women who have family members with BRCA-related cancers. Results of the assessment will determine the need for genetic counseling and BRCA1 and for BRCA2 testing. BRCA-related cancers include these types:  Breast. This occurs in males or females.  Ovarian.  Tubal. This may also be called fallopian tube cancer.  Cancer of the abdominal or pelvic lining (peritoneal cancer).  Prostate.  Pancreatic. Cervical, Uterine, and Ovarian Cancer  Your health care provider may recommend that you be screened regularly for cancer of the pelvic organs. These include your ovaries, uterus, and vagina. This screening involves a pelvic exam, which includes checking for microscopic changes to the surface of your cervix (Pap test).  For women ages 21-65, health care providers may recommend a pelvic exam and a Pap test every three years. For women ages 26-65, they may recommend the Pap test and pelvic exam, combined with testing for human papilloma virus (HPV), every five years. Some types of HPV increase  your risk of cervical cancer. Testing for HPV may also be done on women of any age who have unclear Pap test results.  Other health care providers may not recommend any screening for nonpregnant women who are considered low risk for pelvic cancer and have no symptoms. Ask your health care provider if a screening pelvic exam is right for you.  If you have had past treatment for cervical cancer or a condition that could lead to cancer, you need Pap tests and screening for cancer for at least 20 years after your treatment. If Pap tests have been discontinued for you, your risk factors (such as having a new sexual partner) need to be reassessed to determine if you should start having screenings again. Some women have medical problems that increase the chance of getting cervical cancer. In these cases, your health care provider may recommend that you have screening and Pap tests more often.  If you have a family history of uterine cancer or ovarian cancer, talk with your health care provider about genetic screening.  If you have vaginal bleeding after reaching menopause, tell your health care provider.  There are currently no reliable tests available to screen for ovarian cancer. Lung Cancer  Lung cancer screening is recommended for adults 10-73 years old who are at high risk for lung cancer because of a history of smoking. A yearly low-dose  CT scan of the lungs is recommended if you:  Currently smoke.  Have a history of at least 30 pack-years of smoking and you currently smoke or have quit within the past 15 years. A pack-year is smoking an average of one pack of cigarettes per day for one year. Yearly screening should:  Continue until it has been 15 years since you quit.  Stop if you develop a health problem that would prevent you from having lung cancer treatment. Colorectal Cancer  This type of cancer can be detected and can often be prevented.  Routine colorectal cancer screening usually  begins at age 18 and continues through age 49.  If you have risk factors for colon cancer, your health care provider may recommend that you be screened at an earlier age.  If you have a family history of colorectal cancer, talk with your health care provider about genetic screening.  Your health care provider may also recommend using home test kits to check for hidden blood in your stool.  A small camera at the end of a tube can be used to examine your colon directly (sigmoidoscopy or colonoscopy). This is done to check for the earliest forms of colorectal cancer.  Direct examination of the colon should be repeated every 5-10 years until age 48. However, if early forms of precancerous polyps or small growths are found or if you have a family history or genetic risk for colorectal cancer, you may need to be screened more often. Skin Cancer  Check your skin from head to toe regularly.  Monitor any moles. Be sure to tell your health care provider:  About any new moles or changes in moles, especially if there is a change in a mole's shape or color.  If you have a mole that is larger than the size of a pencil eraser.  If any of your family members has a history of skin cancer, especially at a young age, talk with your health care provider about genetic screening.  Always use sunscreen. Apply sunscreen liberally and repeatedly throughout the day.  Whenever you are outside, protect yourself by wearing long sleeves, pants, a wide-brimmed hat, and sunglasses. What should I know about osteoporosis? Osteoporosis is a condition in which bone destruction happens more quickly than new bone creation. After menopause, you may be at an increased risk for osteoporosis. To help prevent osteoporosis or the bone fractures that can happen because of osteoporosis, the following is recommended:  If you are 36-60 years old, get at least 1,000 mg of calcium and at least 600 mg of vitamin D per day.  If you are  older than age 31 but younger than age 75, get at least 1,200 mg of calcium and at least 600 mg of vitamin D per day.  If you are older than age 74, get at least 1,200 mg of calcium and at least 800 mg of vitamin D per day. Smoking and excessive alcohol intake increase the risk of osteoporosis. Eat foods that are rich in calcium and vitamin D, and do weight-bearing exercises several times each week as directed by your health care provider. What should I know about how menopause affects my mental health? Depression may occur at any age, but it is more common as you become older. Common symptoms of depression include:  Low or sad mood.  Changes in sleep patterns.  Changes in appetite or eating patterns.  Feeling an overall lack of motivation or enjoyment of activities that you previously enjoyed.  Frequent crying spells. Talk with your health care provider if you think that you are experiencing depression. What should I know about immunizations? It is important that you get and maintain your immunizations. These include:  Tetanus, diphtheria, and pertussis (Tdap) booster vaccine.  Influenza every year before the flu season begins.  Pneumonia vaccine.  Shingles vaccine. Your health care provider may also recommend other immunizations. This information is not intended to replace advice given to you by your health care provider. Make sure you discuss any questions you have with your health care provider. Document Released: 10/30/2005 Document Revised: 03/27/2016 Document Reviewed: 06/11/2015  2017 Elsevier

## 2016-10-29 DIAGNOSIS — N941 Unspecified dyspareunia: Secondary | ICD-10-CM | POA: Insufficient documentation

## 2016-10-29 DIAGNOSIS — E039 Hypothyroidism, unspecified: Secondary | ICD-10-CM | POA: Insufficient documentation

## 2016-10-29 DIAGNOSIS — B379 Candidiasis, unspecified: Secondary | ICD-10-CM | POA: Insufficient documentation

## 2016-10-29 DIAGNOSIS — E669 Obesity, unspecified: Secondary | ICD-10-CM | POA: Insufficient documentation

## 2016-10-29 DIAGNOSIS — N952 Postmenopausal atrophic vaginitis: Secondary | ICD-10-CM | POA: Insufficient documentation

## 2016-10-29 DIAGNOSIS — Z780 Asymptomatic menopausal state: Secondary | ICD-10-CM | POA: Insufficient documentation

## 2016-11-03 LAB — PAP IG AND HPV HIGH-RISK
HPV, high-risk: NEGATIVE
PAP Smear Comment: 0

## 2016-11-09 LAB — FECAL OCCULT BLOOD, IMMUNOCHEMICAL: FECAL OCCULT BLD: NEGATIVE

## 2016-12-06 IMAGING — CR DG CHEST 2V
2 series · 2 of 2 positions shown · non-contrast
Comparison: None.

CLINICAL DATA: Preoperative evaluation for bariatric surgery.
Hypertension.

EXAM:
CHEST  2 VIEW

[chest pa]
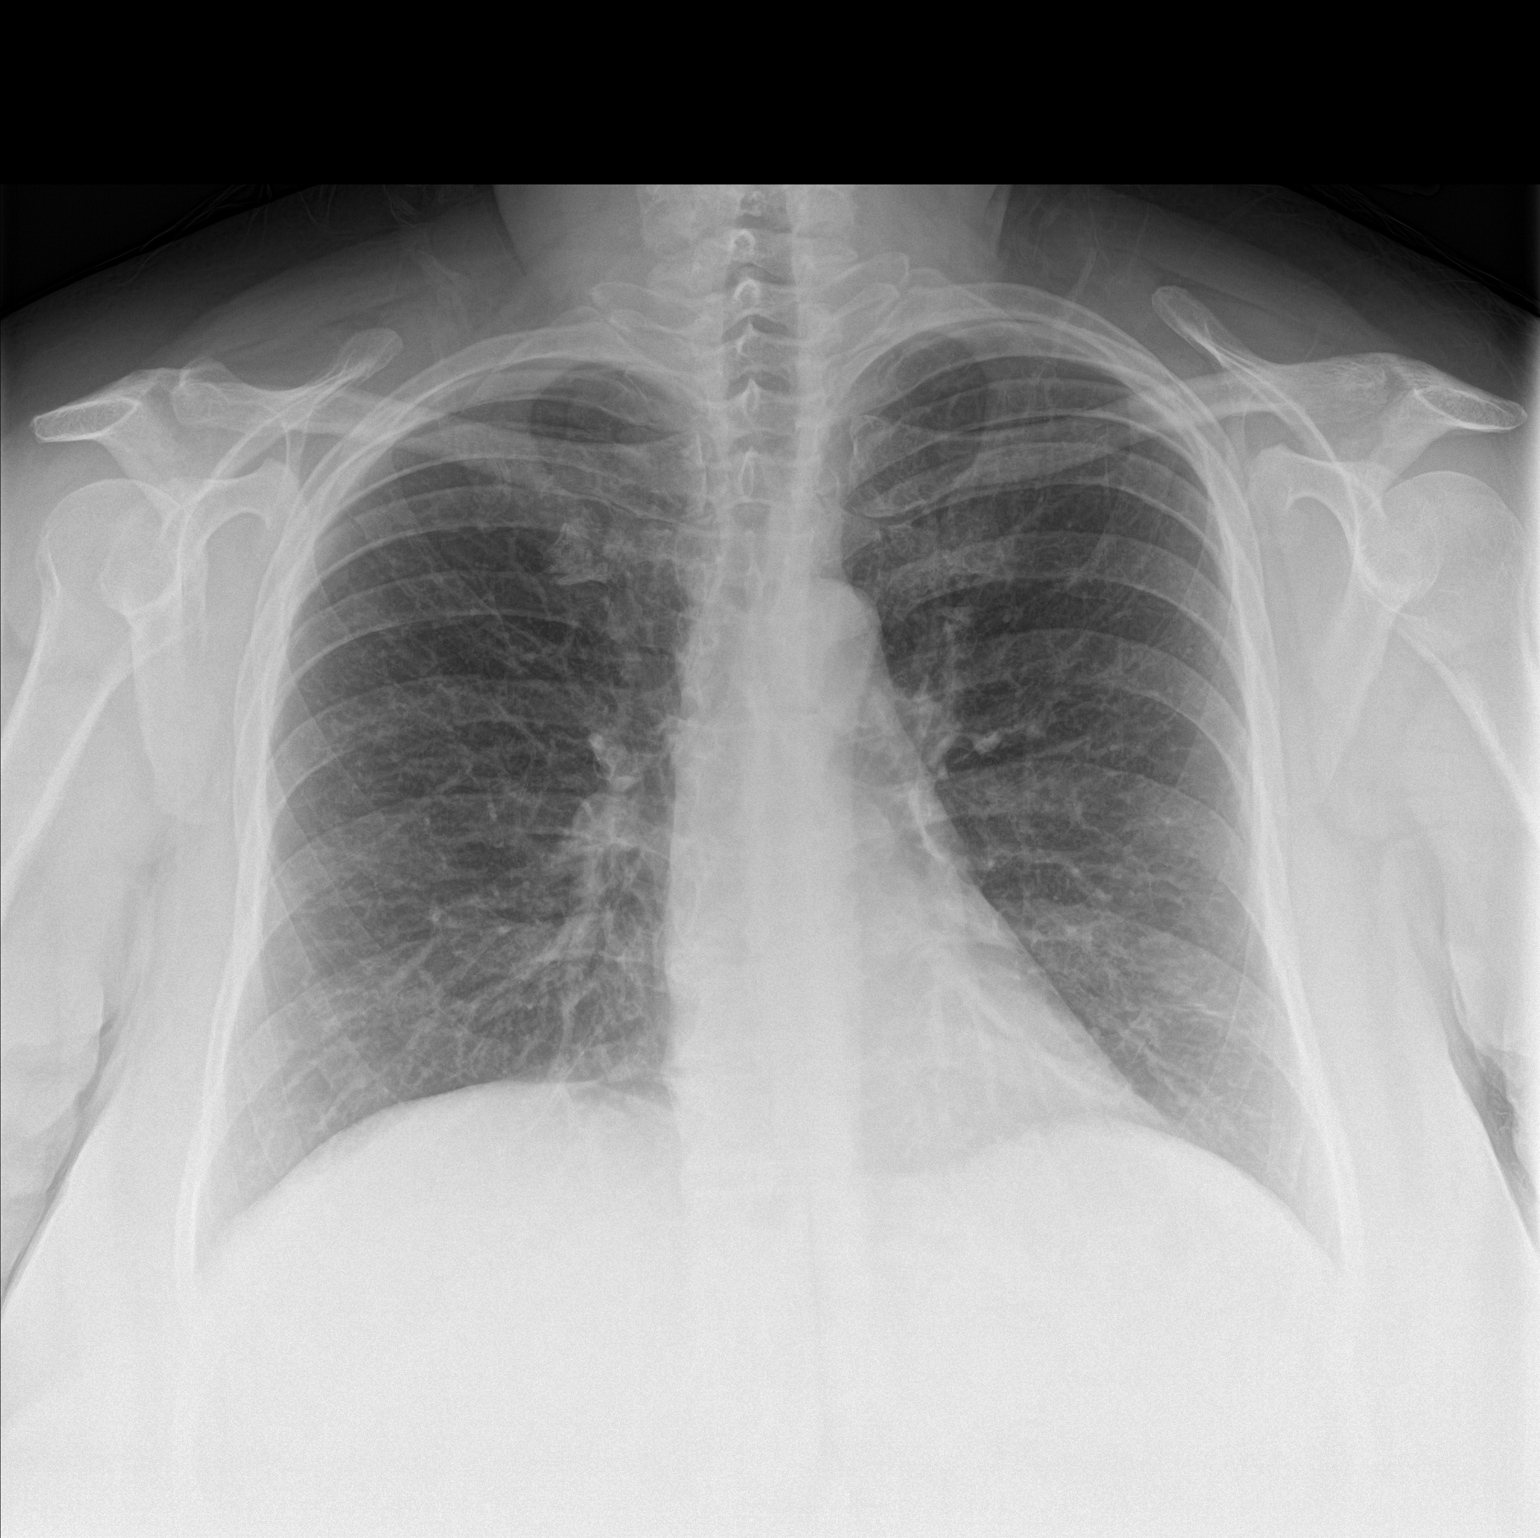

[chest lat]
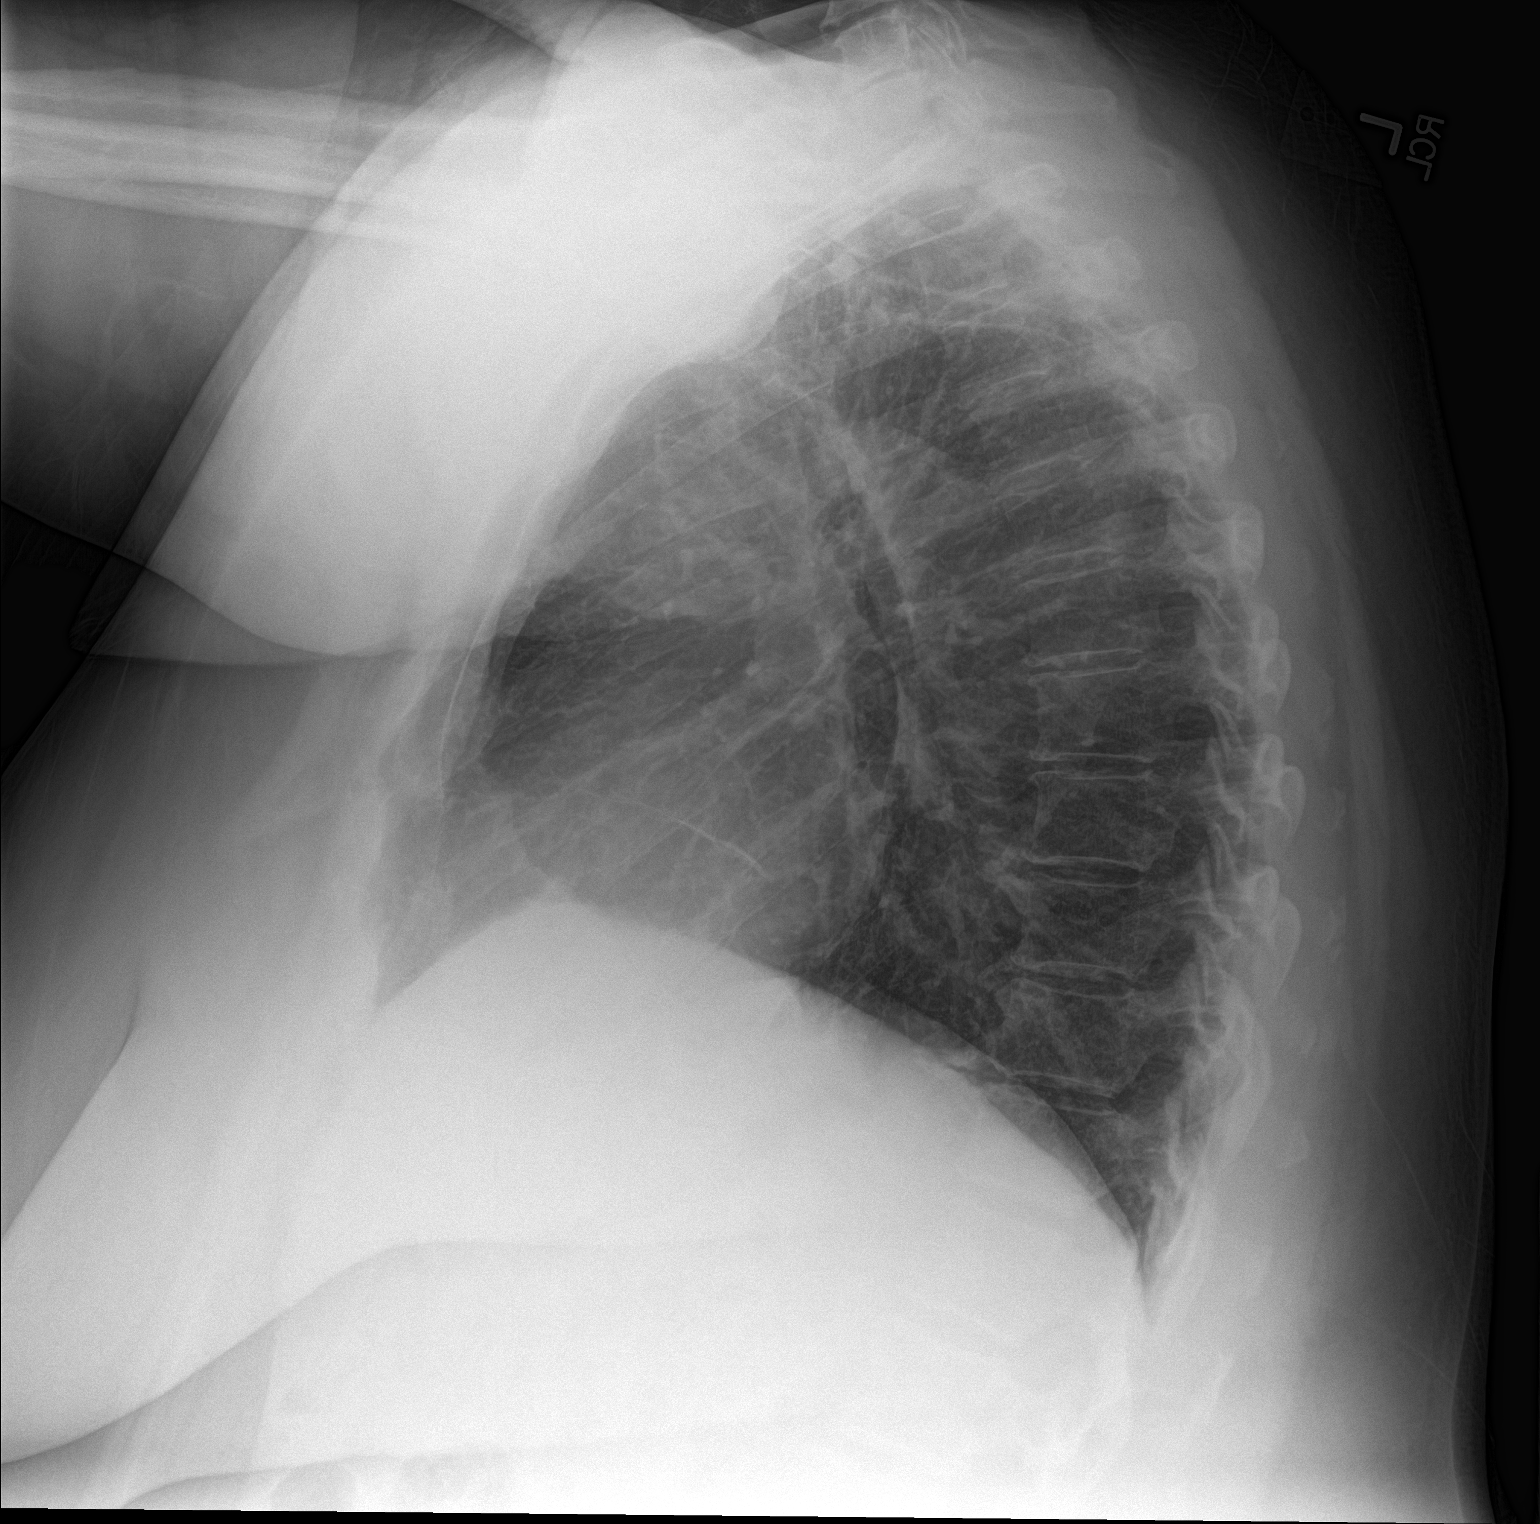

[2 of 2 positions shown; findings below may reference images not displayed]

FINDINGS: Lungs are clear. Heart size and pulmonary vascularity are normal. No
adenopathy. There is mild degenerative change in the thoracic spine.
IMPRESSION: No edema or consolidation.

## 2016-12-07 ENCOUNTER — Ambulatory Visit
Admission: RE | Admit: 2016-12-07 | Discharge: 2016-12-07 | Disposition: A | Discharge status: Home / Self Care | Payer: BC Managed Care – PPO | Source: Ambulatory Visit | Attending: Obstetrics and Gynecology | Admitting: Obstetrics and Gynecology

## 2016-12-07 ENCOUNTER — Encounter: Payer: Self-pay | Admitting: Radiology

## 2016-12-07 DIAGNOSIS — N6489 Other specified disorders of breast: Secondary | ICD-10-CM | POA: Diagnosis not present

## 2016-12-07 DIAGNOSIS — Z1239 Encounter for other screening for malignant neoplasm of breast: Secondary | ICD-10-CM

## 2016-12-07 DIAGNOSIS — Z1231 Encounter for screening mammogram for malignant neoplasm of breast: Secondary | ICD-10-CM | POA: Insufficient documentation

## 2017-05-02 ENCOUNTER — Emergency Department: Payer: BC Managed Care – PPO

## 2017-05-02 ENCOUNTER — Encounter: Payer: Self-pay | Admitting: Radiology

## 2017-05-02 ENCOUNTER — Emergency Department
Admission: EM | Admit: 2017-05-02 | Discharge: 2017-05-03 | Disposition: A | Discharge status: Home / Self Care | Payer: BC Managed Care – PPO | Attending: Student in an Organized Health Care Education/Training Program | Admitting: Student in an Organized Health Care Education/Training Program

## 2017-05-02 DIAGNOSIS — Z96653 Presence of artificial knee joint, bilateral: Secondary | ICD-10-CM | POA: Diagnosis not present

## 2017-05-02 DIAGNOSIS — R110 Nausea: Secondary | ICD-10-CM | POA: Diagnosis not present

## 2017-05-02 DIAGNOSIS — E039 Hypothyroidism, unspecified: Secondary | ICD-10-CM | POA: Diagnosis not present

## 2017-05-02 DIAGNOSIS — R1013 Epigastric pain: Secondary | ICD-10-CM | POA: Diagnosis not present

## 2017-05-02 DIAGNOSIS — Z79899 Other long term (current) drug therapy: Secondary | ICD-10-CM | POA: Diagnosis not present

## 2017-05-02 DIAGNOSIS — R079 Chest pain, unspecified: Secondary | ICD-10-CM | POA: Diagnosis present

## 2017-05-02 LAB — CBC
HEMATOCRIT: 42.2 % (ref 35.0–47.0)
HEMOGLOBIN: 14.2 g/dL (ref 12.0–16.0)
MCH: 31.4 pg (ref 26.0–34.0)
MCHC: 33.6 g/dL (ref 32.0–36.0)
MCV: 93.4 fL (ref 80.0–100.0)
Platelets: 243 10*3/uL (ref 150–440)
RBC: 4.52 MIL/uL (ref 3.80–5.20)
RDW: 12.9 % (ref 11.5–14.5)
WBC: 6.8 10*3/uL (ref 3.6–11.0)

## 2017-05-02 LAB — TROPONIN I: Troponin I: <0.03 ng/mL (ref <0.03)

## 2017-05-02 LAB — BASIC METABOLIC PANEL
ANION GAP: 7 (ref 5–15)
BUN: 22 mg/dL — AB (ref 6–20)
CHLORIDE: 107 mmol/L (ref 101–111)
CO2: 27 mmol/L (ref 22–32)
Calcium: 9.7 mg/dL (ref 8.9–10.3)
Creatinine, Ser: 0.84 mg/dL (ref 0.44–1.00)
Glucose, Bld: 133 mg/dL — ABNORMAL HIGH (ref 65–99)
POTASSIUM: 3.9 mmol/L (ref 3.5–5.1)
SODIUM: 141 mmol/L (ref 135–145)

## 2017-05-02 LAB — LIPASE, BLOOD: LIPASE: 34 U/L (ref 11–51)

## 2017-05-02 LAB — FIBRIN DERIVATIVES D-DIMER (ARMC ONLY): FIBRIN DERIVATIVES D-DIMER (ARMC): 197.27 (ref 0.00–499.00)

## 2017-05-02 MED ORDER — PROMETHAZINE HCL 25 MG/ML IJ SOLN
12.5000 mg | Freq: Four times a day (QID) | INTRAMUSCULAR | Status: DC | PRN
Start: 2017-05-02 — End: 2017-05-03
  Administered 2017-05-02: 12.5 mg via INTRAVENOUS
  Filled 2017-05-02: qty 1

## 2017-05-02 MED ORDER — MORPHINE SULFATE (PF) 4 MG/ML IV SOLN
4.0000 mg | INTRAVENOUS | Status: DC | PRN
  Administered 2017-05-02: 4 mg via INTRAVENOUS
  Filled 2017-05-02: qty 1

## 2017-05-02 MED ORDER — SODIUM CHLORIDE 0.9 % IV BOLUS (SEPSIS): 1000.0000 mL | Freq: Once | INTRAVENOUS | Status: DC

## 2017-05-02 MED ORDER — FENTANYL CITRATE (PF) 100 MCG/2ML IJ SOLN
INTRAMUSCULAR | Status: AC
  Filled 2017-05-02: qty 2

## 2017-05-02 MED ORDER — PROMETHAZINE HCL 12.5 MG PO TABS: 12.5000 mg | ORAL_TABLET | Freq: Four times a day (QID) | ORAL | 0 refills | Status: DC | PRN

## 2017-05-02 MED ORDER — SODIUM CHLORIDE 0.9 % IV BOLUS (SEPSIS)
1000.0000 mL | Freq: Once | INTRAVENOUS | Status: AC
  Administered 2017-05-02: 1000 mL via INTRAVENOUS

## 2017-05-02 MED ORDER — FENTANYL CITRATE (PF) 100 MCG/2ML IJ SOLN
100.0000 ug | INTRAMUSCULAR | Status: DC | PRN
  Administered 2017-05-02: 100 ug via INTRAVENOUS

## 2017-05-02 MED ORDER — IOPAMIDOL (ISOVUE-300) INJECTION 61%
100.0000 mL | Freq: Once | INTRAVENOUS | Status: AC | PRN
  Administered 2017-05-02: 100 mL via INTRAVENOUS

## 2017-05-02 MED ORDER — IOPAMIDOL (ISOVUE-300) INJECTION 61%
15.0000 mL | INTRAVENOUS | Status: AC
  Administered 2017-05-02: 15 mL via ORAL

## 2017-05-02 MED ORDER — DICYCLOMINE HCL 10 MG PO CAPS: 10.0000 mg | ORAL_CAPSULE | Freq: Three times a day (TID) | ORAL | 0 refills | Status: DC | PRN

## 2017-05-02 NOTE — Discharge Instructions (Addendum)

## 2017-05-02 NOTE — ED Triage Notes (Addendum)
Pt c/o intermittent chest pain/cramping to center of chest since 4pm today, at times pain radiates up under ribs; nausea but no vomiting; denies shortness of breath; c/o upper and middle back but chest pain does not radiate from front of chest; pt talking in complete coherent sentences;

## 2017-05-02 NOTE — ED Provider Notes (Addendum)
[facility] Emergency Department Provider Note    First MD Initiated Contact with Patient 05/02/17 2118     (approximate)  I have reviewed the triage vital signs and the nursing notes.   HISTORY  Chief Complaint Chest Pain    HPI Sherry Evans is a 56 y.o. female presents a chief complaint of intermittent crampy epigastric and retrosternal chest pain. States the pain will be severe and 10/10 in nature and crampy as described. Has felt nausea but no vomiting. Denies any shortness of breath. No lower STEMI swelling. She is status post cholecystectomy but also with duodenal bypass surgery 3 years ago. Does have a history of reflux but states that this feels different location. No exertional component of her pain.   Past Medical History:  Diagnosis Date  . Anxiety   . Depression   . GERD (gastroesophageal reflux disease)   . History of sinus surgery    3 times  . Hypothyroidism   . Sleep apnea    use C-PAP   Family History  Problem Relation Age of Onset  . Hypertension Father   . Diabetes Father   . Breast cancer Maternal Aunt        great aunts. 70-80  . Breast cancer Maternal Grandmother 65       metatastic to breast  . Ovarian cancer Maternal Grandmother   . Breast cancer Paternal Grandmother        82's  . Colon cancer Neg Hx    Past Surgical History:  Procedure Laterality Date  . adnoids removed  1969  . BACK SURGERY  2008  . BICEPT TENODESIS Right 04/02/2016   Procedure: BICEPS TENODESIS;  Surgeon: Corky Mull, MD;  Location: [facility] ORS;  Service: Orthopedics;  Laterality: Right;  . BLADDER SUSPENSION  2009  . BREAST BIOPSY Left 1990's   benign  . BREAST BIOPSY Left October 28, 2015   SMALL FRAGMENTS OF LYMPHOID TISSUE  . CHOLECYSTECTOMY    . COLONOSCOPY    . DILATION AND CURETTAGE OF UTERUS    . GASTRIC RESTRICTION SURGERY  08/23/15  . HERNIA REPAIR  2016   hiatal  . JOINT REPLACEMENT Bilateral 2014   Partial Knee Replacement,   Surgeon: Dr. Little Ishikawa, Brooke Bonito. [place] [facility]  2014  . SHOULDER ARTHROSCOPY WITH DEBRIDEMENT AND BICEP TENDON REPAIR Right 04/02/2016   Procedure: SHOULDER ARTHROSCOPY WITH DEBRIDEMENT ;  Surgeon: Corky Mull, MD;  Location: [facility] ORS;  Service: Orthopedics;  Laterality: Right;  . SHOULDER ARTHROSCOPY WITH OPEN ROTATOR CUFF REPAIR Right 04/02/2016   Procedure: SHOULDER ARTHROSCOPY WITH OPEN ROTATOR CUFF REPAIR;  Surgeon: Corky Mull, MD;  Location: [facility] ORS;  Service: Orthopedics;  Laterality: Right;  . SHOULDER ARTHROSCOPY WITH SUBACROMIAL DECOMPRESSION Right 04/02/2016   Procedure: SHOULDER ARTHROSCOPY WITH SUBACROMIAL DECOMPRESSION;  Surgeon: Corky Mull, MD;  Location: [facility] ORS;  Service: Orthopedics;  Laterality: Right;   Patient Active Problem List   Diagnosis Date Noted  . Monilia infection 10/29/2016  . Hypothyroidism 10/29/2016  . Vaginal atrophy 10/29/2016  . Dyspareunia in female 10/29/2016  . Obesity (BMI 30.0-34.9) 10/29/2016  . Menopause 10/29/2016  . Post op infection 11/11/2015      Prior to Admission medications   Medication Sig Start Date End Date Taking? Authorizing Provider  azelastine (ASTELIN) 0.1 % nasal spray Place 2 sprays into both nostrils 2 (two) times daily as needed for allergies. Use in each nostril as directed  [provider]  budesonide (PULMICORT) 0.5 MG/2ML nebulizer solution Take 0.5 mg by nebulization 2 (two) times daily as needed.    [provider]  cetirizine (ZYRTEC) 10 MG tablet Take 10 mg by mouth daily.    [provider]  Cholecalciferol (VITAMIN D3 SUPER STRENGTH) 2000 units TABS Take 2,000 Units by mouth daily.    [provider]  conjugated estrogens (PREMARIN) vaginal cream Place 6.76 Applicatorfuls vaginally 2 (two) times a week. 10/29/16   Defrancesco, Alanda Slim, MD  gabapentin (NEURONTIN) 600 MG tablet TAKE 2 TABLETS BY MOUTH WITH EVENING MEAL 06/01/14   [provider]    levothyroxine (SYNTHROID, LEVOTHROID) 100 MCG tablet Take 100 mcg by mouth daily before breakfast.     [provider]  lubiprostone (AMITIZA) 24 MCG capsule Take by mouth. 09/23/16   [provider]  montelukast (SINGULAIR) 10 MG tablet Take 10 mg by mouth at bedtime.     [provider]  Multiple Vitamins-Calcium (VIACTIV MULTI-VITAMIN) CHEW Chew 1 tablet by mouth daily.    [provider]  Multiple Vitamins-Minerals (ADVANCED MULTI EA PO) Take 1 tablet by mouth daily. bariartric advantage    [provider]  nystatin-triamcinolone ointment (MYCOLOG) Apply 1 application topically 2 (two) times daily as needed. 10/28/16   Defrancesco, Alanda Slim, MD  Probiotic Product (PROBIOTIC-10 PO) Take by mouth.    [provider]  ranitidine (ZANTAC) 150 MG tablet Take 150 mg by mouth 2 (two) times daily.    [provider]  traZODone (DESYREL) 50 MG tablet Take 50 mg by mouth at bedtime as needed for sleep.    [provider]    Allergies Azithromycin and Sulfa antibiotics    Social History Social History  Substance Use Topics  . Smoking status: Never Smoker  . Smokeless tobacco: Never Used  . Alcohol use No    Review of Systems Patient denies headaches, rhinorrhea, blurry vision, numbness, shortness of breath, chest pain, edema, cough, abdominal pain, nausea, vomiting, diarrhea, dysuria, fevers, rashes or hallucinations unless otherwise stated above in HPI. ____________________________________________   PHYSICAL EXAM:  VITAL SIGNS: Vitals:   05/02/17 2032  BP: 136/63  Pulse: 77  Resp: 18  Temp: 98.1 F (36.7 C)  SpO2: 100%    Constitutional: Alert and oriented. Well appearing and in no acute distress. Eyes: Conjunctivae are normal.  Head: Atraumatic. Nose: No congestion/rhinnorhea. Mouth/Throat: Mucous membranes are moist.   Neck: No stridor. Painless ROM.  Cardiovascular: Normal rate, regular rhythm. Grossly  normal heart sounds.  Good peripheral circulation. Respiratory: Normal respiratory effort.  No retractions. Lungs CTAB. Gastrointestinal: Soft with mild epigastric ttp. No distention. No abdominal bruits. No CVA tenderness. Genitourinary:  Musculoskeletal: No lower extremity tenderness nor edema.  No joint effusions. Neurologic:  Normal speech and language. No gross focal neurologic deficits are appreciated. No facial droop Skin:  Skin is warm, dry and intact. No rash noted. Psychiatric: Mood and affect are normal. Speech and behavior are normal.  ____________________________________________   LABS (all labs ordered are listed, but only abnormal results are displayed)  Results for orders placed or performed during the hospital encounter of 05/02/17 (from the past 24 hour(s))  Basic metabolic panel     Status: Abnormal   Collection Time: 05/02/17  8:37 PM  Result Value Ref Range   Sodium 141 135 - 145 mmol/L   Potassium 3.9 3.5 - 5.1 mmol/L   Chloride 107 101 - 111 mmol/L   CO2 27 22 -  32 mmol/L   Glucose, Bld 133 (H) 65 - 99 mg/dL   BUN 22 (H) 6 - 20 mg/dL   Creatinine, Ser 0.84 0.44 - 1.00 mg/dL   Calcium 9.7 8.9 - 10.3 mg/dL   GFR calc non Af Amer >60 >60 mL/min   GFR calc Af Amer >60 >60 mL/min   Anion gap 7 5 - 15  CBC     Status: None   Collection Time: 05/02/17  8:37 PM  Result Value Ref Range   WBC 6.8 3.6 - 11.0 K/uL   RBC 4.52 3.80 - 5.20 MIL/uL   Hemoglobin 14.2 12.0 - 16.0 g/dL   HCT 42.2 35.0 - 47.0 %   MCV 93.4 80.0 - 100.0 fL   MCH 31.4 26.0 - 34.0 pg   MCHC 33.6 32.0 - 36.0 g/dL   RDW 12.9 11.5 - 14.5 %   Platelets 243 150 - 440 K/uL  Troponin I     Status: None   Collection Time: 05/02/17  8:37 PM  Result Value Ref Range   Troponin I <0.03 <0.03 ng/mL  Lipase, blood     Status: None   Collection Time: 05/02/17  8:37 PM  Result Value Ref Range   Lipase 34 11 - 51 U/L   ____________________________________________  EKG My review and personal  interpretation at Time: 20:28   Indication: chest pain  Rate: 70  Rhythm: sinus Axis: normal Other: non specific st changes, no stemi, normal intervals ____________________________________________  RADIOLOGY  I personally reviewed all radiographic images ordered to evaluate for the above acute complaints and reviewed radiology reports and findings.  These findings were personally discussed with the patient.  Please see medical record for radiology report.  ____________________________________________   PROCEDURES  Procedure(s) performed:  Procedures    Critical Care performed: no ____________________________________________   INITIAL IMPRESSION / ASSESSMENT AND PLAN / ED COURSE  Pertinent labs & imaging results that were available during my care of the patient were reviewed by me and considered in my medical decision making (see chart for details).  DDX: sbo, pouchitis, dehiscencs, hernia, esophagita, acs, pe, ptx, pna  Danyla D Gosser is a 56 y.o. who presents to the ED with symptoms as described above. Patient without any evidence of ACS based on EKG, negative troponin. History less consistent with ACS as well. Based on location of pain I'm more concerned for obstructive process or pouchitis. I do feel patient will require CT imaging to further characterize her pain. This is not clinically consistent with PE. D-dimer is negative. Patient will be given IV fluids as well as IV pain medication and antinausea medication. Patient will be signed out to Dr. Dahlia Client pending results of CT imaging.   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 Patient has been tolerating oral hydration but admits to decreased hydration. There is no evidence of perforation. Mild inflammation but her pain is currently controlled. At this  point patient will be signed out to Dr. Dahlia Client pending completion of IV fluids and reassessment. If pain still controlled to do believe that she'll be stable and appropriate for follow-up with her surgeon.      ____________________________________________   FINAL CLINICAL IMPRESSION(S) / ED DIAGNOSES  Final diagnoses:  Epigastric pain  Nausea      NEW MEDICATIONS STARTED DURING THIS VISIT:  New Prescriptions   No medications on file     Note:  This document was prepared using Dragon voice recognition software and may include unintentional dictation errors.    Merlyn Lot, MD 05/02/17 1173    Merlyn Lot, MD 05/03/17 [phone]

## 2017-05-02 NOTE — ED Notes (Signed)
Patient c/o epigastric pain, SOB and nausea beginning at 1600.  Pt radiates to left/right breasts and back.  Pt reports hx of bypass surgery and cholecystectomy.

## 2017-05-03 LAB — TROPONIN I: Troponin I: <0.03 ng/mL (ref <0.03)

## 2017-05-03 MED ORDER — HYDROCODONE-ACETAMINOPHEN 5-325 MG PO TABS: 2.0000 | ORAL_TABLET | Freq: Four times a day (QID) | ORAL | 0 refills | Status: DC | PRN

## 2017-05-03 MED ORDER — POLYETHYLENE GLYCOL 3350 17 G PO PACK: 17.0000 g | PACK | Freq: Every day | ORAL | 0 refills | Status: DC

## 2017-05-03 NOTE — ED Notes (Signed)
Patient given gingerale and encouraged to drink per MD Quentin Cornwall.  Patient continues to report relief of pain

## 2017-05-03 NOTE — ED Notes (Signed)
Reviewed d/c instructions, follow-up care, prescriptions with patient. Pt verbalized understanding.  

## 2017-05-03 NOTE — ED Provider Notes (Signed)
-----------------------------------------   1:30 AM on 05/03/2017 -----------------------------------------   Blood pressure 125/69, pulse 83, temperature 98.1 F (36.7 C), temperature source Oral, resp. rate 10, height 5\' 4"  (1.626 m), weight 83.9 kg (185 lb), last menstrual period 01/03/2016, SpO2 99 %.  Assuming care from Dr. Quentin Cornwall.  In short, Sherry Evans is a 56 y.o. female with a chief complaint of Chest Pain .  Refer to the original H&P for additional details.  The current plan of care is to reassess the patient.     The patient is able to drink without any further vomiting and her pain is controlled. She did receive her liter of normal saline. She will be discharged to home to follow-up with Dr. Duke Salvia her surgeon.   Loney Hering, MD 05/03/17 [phone]

## 2017-10-13 ENCOUNTER — Other Ambulatory Visit: Payer: Self-pay | Admitting: Bariatrics

## 2017-10-13 DIAGNOSIS — Z9884 Bariatric surgery status: Secondary | ICD-10-CM

## 2017-10-13 DIAGNOSIS — R1083 Colic: Secondary | ICD-10-CM

## 2017-10-28 ENCOUNTER — Ambulatory Visit
Admission: RE | Admit: 2017-10-28 | Discharge: 2017-10-28 | Disposition: A | Discharge status: Home / Self Care | Payer: BC Managed Care – PPO | Source: Ambulatory Visit | Attending: Bariatrics | Admitting: Bariatrics

## 2017-10-28 DIAGNOSIS — R1084 Generalized abdominal pain: Secondary | ICD-10-CM | POA: Insufficient documentation

## 2017-10-28 DIAGNOSIS — Z9884 Bariatric surgery status: Secondary | ICD-10-CM | POA: Diagnosis present

## 2017-10-28 DIAGNOSIS — R1083 Colic: Secondary | ICD-10-CM

## 2017-10-28 MED ORDER — IOPAMIDOL (ISOVUE-300) INJECTION 61%
100.0000 mL | Freq: Once | INTRAVENOUS | Status: AC | PRN
  Administered 2017-10-28: 100 mL via INTRAVENOUS

## 2017-11-02 ENCOUNTER — Other Ambulatory Visit: Payer: Self-pay | Admitting: Internal Medicine

## 2017-11-03 ENCOUNTER — Encounter: Payer: BC Managed Care – PPO | Admitting: Obstetrics and Gynecology

## 2017-11-19 ENCOUNTER — Ambulatory Visit: Payer: BC Managed Care – PPO | Attending: Neurology

## 2017-11-19 DIAGNOSIS — F5101 Primary insomnia: Secondary | ICD-10-CM | POA: Insufficient documentation

## 2017-11-19 DIAGNOSIS — G4733 Obstructive sleep apnea (adult) (pediatric): Secondary | ICD-10-CM | POA: Insufficient documentation

## 2017-12-02 NOTE — Progress Notes (Signed)
ANNUAL PREVENTATIVE CARE GYN  ENCOUNTER NOTE  Subjective:       Sherry Evans is a 57 y.o. G70P1021 female here for a routine annual gynecologic exam.  Current complaints: 1. none She has a family history of breast cancer; status post bowel function normal. Bladder function normal. Lump biopsied(2017) and cleared by Dr. Bary Castilla.  Patient not routinely exercising.  She is taking calcium and vitamin D. No significant major interval health issues identified  Gynecologic History Patient's last menstrual period was 01/03/2016. Contraception: postmenopausal Last Pap: 10/29/2016 ascus/neg . Results were: normal Last mammogram: 12/08/2016 birad 1   Obstetric History OB History  Gravida Para Term Preterm AB Living  3 1 1   2 1   SAB TAB Ectopic Multiple Live Births  2   0   1    # Outcome Date GA Lbr Len/2nd Weight Sex Delivery Anes PTL Lv  3 Term 1990   8 lb 9.6 oz (3.901 kg) F Vag-Spont   LIV  2 SAB           1 SAB             Obstetric Comments  1st Menstrual Cycle: 12  1st Pregnancy:  27    Past Medical History:  Diagnosis Date  . Anxiety   . Depression   . GERD (gastroesophageal reflux disease)   . History of sinus surgery    3 times  . Hypothyroidism   . Sleep apnea    use C-PAP    Past Surgical History:  Procedure Laterality Date  . adnoids removed  1969  . BACK SURGERY  2008  . BICEPT TENODESIS Right 04/02/2016   Procedure: BICEPS TENODESIS;  Surgeon: Corky Mull, MD;  Location: [facility];  Service: Orthopedics;  Laterality: Right;  . BLADDER SUSPENSION  2009  . BREAST BIOPSY Left 1990's   benign  . BREAST BIOPSY Left October 28, 2015   SMALL FRAGMENTS OF LYMPHOID TISSUE  . CHOLECYSTECTOMY    . COLONOSCOPY    . DILATION AND CURETTAGE OF UTERUS    . GASTRIC RESTRICTION SURGERY  08/23/15  . HERNIA REPAIR  2016   hiatal  . JOINT REPLACEMENT Bilateral 2014   Partial Knee Replacement,  Surgeon: Dr. Little Ishikawa, [facility]. [place] [facility]  2014  . SHOULDER  ARTHROSCOPY WITH DEBRIDEMENT AND BICEP TENDON REPAIR Right 04/02/2016   Procedure: SHOULDER ARTHROSCOPY WITH DEBRIDEMENT ;  Surgeon: Corky Mull, MD;  Location: [facility];  Service: Orthopedics;  Laterality: Right;  . SHOULDER ARTHROSCOPY WITH OPEN ROTATOR CUFF REPAIR Right 04/02/2016   Procedure: SHOULDER ARTHROSCOPY WITH OPEN ROTATOR CUFF REPAIR;  Surgeon: Corky Mull, MD;  Location: [facility];  Service: Orthopedics;  Laterality: Right;  . SHOULDER ARTHROSCOPY WITH SUBACROMIAL DECOMPRESSION Right 04/02/2016   Procedure: SHOULDER ARTHROSCOPY WITH SUBACROMIAL DECOMPRESSION;  Surgeon: Corky Mull, MD;  Location: [facility];  Service: Orthopedics;  Laterality: Right;    Current Outpatient Medications on File Prior to Visit  Medication Sig Dispense Refill  . azelastine (ASTELIN) 0.1 % nasal spray Place 2 sprays into both nostrils 2 (two) times daily as needed for allergies. Use in each nostril as directed    . budesonide (PULMICORT) 0.5 MG/2ML nebulizer solution Take 0.5 mg by nebulization 2 (two) times daily as needed.    . cetirizine (ZYRTEC) 10 MG tablet Take 10 mg by mouth daily.    . Cholecalciferol (VITAMIN D3 SUPER STRENGTH) 2000 units TABS Take 2,000 Units by  mouth daily.    Marland Kitchen conjugated estrogens (PREMARIN) vaginal cream Place 6.72 Applicatorfuls vaginally 2 (two) times a week. 42.5 g 12  . dicyclomine (BENTYL) 10 MG capsule Take 1 capsule (10 mg total) by mouth 3 (three) times daily as needed for spasms. 16 capsule 0  . gabapentin (NEURONTIN) 600 MG tablet TAKE 2 TABLETS BY MOUTH WITH EVENING MEAL    . HYDROcodone-acetaminophen (NORCO) 5-325 MG tablet Take 2 tablets by mouth every 6 (six) hours as needed for moderate pain. 6 tablet 0  . levothyroxine (SYNTHROID, LEVOTHROID) 100 MCG tablet Take 100 mcg by mouth daily before breakfast.     . lubiprostone (AMITIZA) 24 MCG capsule Take by mouth.    . montelukast (SINGULAIR) 10 MG tablet Take 10 mg by mouth at bedtime.     . Multiple  Vitamins-Calcium (VIACTIV MULTI-VITAMIN) CHEW Chew 1 tablet by mouth daily.    . Multiple Vitamins-Minerals (ADVANCED MULTI EA PO) Take 1 tablet by mouth daily. bariartric advantage    . nystatin-triamcinolone ointment (MYCOLOG) Apply 1 application topically 2 (two) times daily as needed. 30 g 2  . polyethylene glycol (MIRALAX / GLYCOLAX) packet Take 17 g by mouth daily. Mix one tablespoon with 8oz of your favorite juice or water every day until you are having soft formed stools. Then start taking once daily if you didn't have a stool the day before. 30 each 0  . Probiotic Product (PROBIOTIC-10 PO) Take by mouth.    . promethazine (PHENERGAN) 12.5 MG tablet Take 1 tablet (12.5 mg total) by mouth every 6 (six) hours as needed for nausea or vomiting. 12 tablet 0  . ranitidine (ZANTAC) 150 MG tablet Take 150 mg by mouth 2 (two) times daily.    . sertraline (ZOLOFT) 50 MG tablet TAKE 1 TABLET BY MOUTH EVERY DAY 30 tablet 2  . traZODone (DESYREL) 50 MG tablet Take 50 mg by mouth at bedtime as needed for sleep.     No current facility-administered medications on file prior to visit.     Allergies  Allergen Reactions  . Azithromycin Itching and Rash  . Sulfa Antibiotics Rash    Social History   Socioeconomic History  . Marital status: Married    Spouse name: Not on file  . Number of children: Not on file  . Years of education: Not on file  . Highest education level: Not on file  Social Needs  . Financial resource strain: Not on file  . Food insecurity - worry: Not on file  . Food insecurity - inability: Not on file  . Transportation needs - medical: Not on file  . Transportation needs - non-medical: Not on file  Occupational History  . Not on file  Tobacco Use  . Smoking status: Never Smoker  . Smokeless tobacco: Never Used  Substance and Sexual Activity  . Alcohol use: No    Alcohol/week: 0.0 oz  . Drug use: No  . Sexual activity: Yes    Birth control/protection: Condom  Other  Topics Concern  . Not on file  Social History Narrative  . Not on file    Family History  Problem Relation Age of Onset  . Hypertension Father   . Diabetes Father   . Breast cancer Maternal Aunt        great aunts. 70-80  . Breast cancer Maternal Grandmother 68       metatastic to breast  . Ovarian cancer Maternal Grandmother   . Breast cancer Paternal Grandmother  70's  . Colon cancer Neg Hx     The following portions of the patient's history were reviewed and updated as appropriate: allergies, current medications, past family history, past medical history, past social history, past surgical history and problem list.  Review of Systems Review of Systems  Constitutional:       Occasional hot flashes, mild, not desiring treatment  Eyes: Negative.   Respiratory: Negative.   Cardiovascular: Negative.   Gastrointestinal: Negative.   Genitourinary: Negative.        Occasional urinary leak, wears a mini pad  Musculoskeletal: Negative.   Skin: Negative.   Neurological: Negative.   Endo/Heme/Allergies: Negative.   Psychiatric/Behavioral: Negative.      Objective:   LMP 01/03/2016  BP 100/65   Pulse 76   Ht 5\' 4"  (1.626 m)   Wt 185 lb 8 oz (84.1 kg)   LMP 01/03/2016   BMI 31.84 kg/m   CONSTITUTIONAL: Well-developed, well-nourished female in no acute distress.  PSYCHIATRIC: Normal mood and affect. Normal behavior. Normal judgment and thought content. [place]: Alert and oriented to person, place, and time. Normal muscle tone coordination. No cranial nerve deficit noted. HENT:  Normocephalic, atraumatic, External right and left ear normal. Oropharynx is clear and moist EYES: Conjunctivae and EOM are normal. No scleral icterus.  NECK: Normal range of motion, supple, no masses.  Normal thyroid.  SKIN: Skin is warm and dry. No rash noted. Not diaphoretic. No erythema. No pallor. CARDIOVASCULAR: Normal heart rate noted, regular rhythm, no murmur. RESPIRATORY: Clear to  auscultation bilaterally. Effort and breath sounds normal, no problems with respiration noted. BREASTS: Symmetric in size. No masses, skin changes, nipple drainage, or lymphadenopathy.  Predominantly fatty replaced ABDOMEN: Soft, normal bowel sounds, no distention noted.  No tenderness, rebound or guarding.  BLADDER: Normal PELVIC:  External Genitalia: Normal  BUS: Normal  Vagina: Mild atrophy With decreased rugae   Cervix: Mild atrophic changes; no cervical motion tenderness  Uterus: Normal; midplane, normal size and shape, mobile, nontender  Adnexa: Normal; nonpalpable nontender  RV: Soft stool in rectum, External Exam NormaI and Normal Sphincter tone  MUSCULOSKELETAL: Normal range of motion. No tenderness.  No cyanosis, clubbing, or edema.  2+ distal pulses. LYMPHATIC: No Axillary, Supraclavicular, or Inguinal Adenopathy.    Assessment:   1. Annual gynecologic examination 57 y.o. 2. Contraception: none 3. Obesity 2 4. Vaginal atrophy, minimally symptomatic 5. Menopause, vasomotor symptoms, mild, not desiring treatment 6. Dyspareunia, slightly improved with estrogen cream use  Plan:  1. Pap smear done 2. Mammogram is ordered 3. Stool guaiac cards are given for colon cancer screening 4. Screening labs are to be done through primary care 5.  Premarin cream intravaginal twice a week for vaginal dryness and painful intercourse 6. Recommend lubricants as needed for painful intercourse including:  Astroglide  K-Y jelly  JO H2O lubricant  Virgin olive oil  Coconut oil  7.  Contraception-not needed 8. Continue with healthy eating and exercise with controlled weight loss 9. Continue with calcium supplementation twice a for osteoporosis prevention; screening DEXA scan age 83 10. Return in 1 year or as needed  Brayton Mars, MD  Note: This dictation was prepared with Dragon dictation along with smaller phrase technology. Any transcriptional errors that result from this  process are unintentional.

## 2017-12-08 ENCOUNTER — Ambulatory Visit (INDEPENDENT_AMBULATORY_CARE_PROVIDER_SITE_OTHER): Payer: BC Managed Care – PPO | Admitting: Obstetrics and Gynecology

## 2017-12-08 ENCOUNTER — Encounter: Payer: Self-pay | Admitting: Obstetrics and Gynecology

## 2017-12-08 VITALS — BP 100/65 | HR 76 | Ht 64.0 in | Wt 185.5 lb

## 2017-12-08 DIAGNOSIS — Z1239 Encounter for other screening for malignant neoplasm of breast: Secondary | ICD-10-CM

## 2017-12-08 DIAGNOSIS — N941 Unspecified dyspareunia: Secondary | ICD-10-CM | POA: Diagnosis not present

## 2017-12-08 DIAGNOSIS — Z01419 Encounter for gynecological examination (general) (routine) without abnormal findings: Secondary | ICD-10-CM | POA: Diagnosis not present

## 2017-12-08 DIAGNOSIS — Z1231 Encounter for screening mammogram for malignant neoplasm of breast: Secondary | ICD-10-CM | POA: Diagnosis not present

## 2017-12-08 DIAGNOSIS — Z1211 Encounter for screening for malignant neoplasm of colon: Secondary | ICD-10-CM | POA: Diagnosis not present

## 2017-12-08 DIAGNOSIS — N952 Postmenopausal atrophic vaginitis: Secondary | ICD-10-CM | POA: Diagnosis not present

## 2017-12-08 DIAGNOSIS — Z780 Asymptomatic menopausal state: Secondary | ICD-10-CM

## 2017-12-08 DIAGNOSIS — E669 Obesity, unspecified: Secondary | ICD-10-CM | POA: Diagnosis not present

## 2017-12-08 DIAGNOSIS — E039 Hypothyroidism, unspecified: Secondary | ICD-10-CM

## 2017-12-08 NOTE — Patient Instructions (Signed)
1.  Pap smear is done. 2.  Mammogram is ordered 3.  Stool guaiac cards are given for colon cancer screening 4.  Screening labs are to be done through primary care 5.  Continue with healthy eating and exercise; controlled weight loss is recommended at 1 pound per month 6.  Continue using lubricants for vaginal dryness 7.  Continue taking calcium and vitamin D supplementation for osteoporosis prevention 8.  Return in 1 year   Health Maintenance for Postmenopausal Women Menopause is a normal process in which your reproductive ability comes to an end. This process happens gradually over a span of months to years, usually between the ages of 38 and 10. Menopause is complete when you have missed 12 consecutive menstrual periods. It is important to talk with your health care provider about some of the most common conditions that affect postmenopausal women, such as heart disease, cancer, and bone loss (osteoporosis). Adopting a healthy lifestyle and getting preventive care can help to promote your health and wellness. Those actions can also lower your chances of developing some of these common conditions. What should I know about menopause? During menopause, you may experience a number of symptoms, such as:  Moderate-to-severe hot flashes.  Night sweats.  Decrease in sex drive.  Mood swings.  Headaches.  Tiredness.  Irritability.  Memory problems.  Insomnia.  Choosing to treat or not to treat menopausal changes is an individual decision that you make with your health care provider. What should I know about hormone replacement therapy and supplements? Hormone therapy products are effective for treating symptoms that are associated with menopause, such as hot flashes and night sweats. Hormone replacement carries certain risks, especially as you become older. If you are thinking about using estrogen or estrogen with progestin treatments, discuss the benefits and risks with your health care  provider. What should I know about heart disease and stroke? Heart disease, heart attack, and stroke become more likely as you age. This may be due, in part, to the hormonal changes that your body experiences during menopause. These can affect how your body processes dietary fats, triglycerides, and cholesterol. Heart attack and stroke are both medical emergencies. There are many things that you can do to help prevent heart disease and stroke:  Have your blood pressure checked at least every 1-2 years. High blood pressure causes heart disease and increases the risk of stroke.  If you are 73-45 years old, ask your health care provider if you should take aspirin to prevent a heart attack or a stroke.  Do not use any tobacco products, including cigarettes, chewing tobacco, or electronic cigarettes. If you need help quitting, ask your health care provider.  It is important to eat a healthy diet and maintain a healthy weight. ? Be sure to include plenty of vegetables, fruits, low-fat dairy products, and lean protein. ? Avoid eating foods that are high in solid fats, added sugars, or salt (sodium).  Get regular exercise. This is one of the most important things that you can do for your health. ? Try to exercise for at least 150 minutes each week. The type of exercise that you do should increase your heart rate and make you sweat. This is known as moderate-intensity exercise. ? Try to do strengthening exercises at least twice each week. Do these in addition to the moderate-intensity exercise.  Know your numbers.Ask your health care provider to check your cholesterol and your blood glucose. Continue to have your blood tested as directed by  your health care provider.  What should I know about cancer screening? There are several types of cancer. Take the following steps to reduce your risk and to catch any cancer development as early as possible. Breast Cancer  Practice breast self-awareness. ? This  means understanding how your breasts normally appear and feel. ? It also means doing regular breast self-exams. Let your health care provider know about any changes, no matter how small.  If you are 85 or older, have a clinician do a breast exam (clinical breast exam or CBE) every year. Depending on your age, family history, and medical history, it may be recommended that you also have a yearly breast X-ray (mammogram).  If you have a family history of breast cancer, talk with your health care provider about genetic screening.  If you are at high risk for breast cancer, talk with your health care provider about having an MRI and a mammogram every year.  Breast cancer (BRCA) gene test is recommended for women who have family members with BRCA-related cancers. Results of the assessment will determine the need for genetic counseling and BRCA1 and for BRCA2 testing. BRCA-related cancers include these types: ? Breast. This occurs in males or females. ? Ovarian. ? Tubal. This may also be called fallopian tube cancer. ? Cancer of the abdominal or pelvic lining (peritoneal cancer). ? Prostate. ? Pancreatic.  Cervical, Uterine, and Ovarian Cancer Your health care provider may recommend that you be screened regularly for cancer of the pelvic organs. These include your ovaries, uterus, and vagina. This screening involves a pelvic exam, which includes checking for microscopic changes to the surface of your cervix (Pap test).  For women ages 21-65, health care providers may recommend a pelvic exam and a Pap test every three years. For women ages 50-65, they may recommend the Pap test and pelvic exam, combined with testing for human papilloma virus (HPV), every five years. Some types of HPV increase your risk of cervical cancer. Testing for HPV may also be done on women of any age who have unclear Pap test results.  Other health care providers may not recommend any screening for nonpregnant women who are  considered low risk for pelvic cancer and have no symptoms. Ask your health care provider if a screening pelvic exam is right for you.  If you have had past treatment for cervical cancer or a condition that could lead to cancer, you need Pap tests and screening for cancer for at least 20 years after your treatment. If Pap tests have been discontinued for you, your risk factors (such as having a new sexual partner) need to be reassessed to determine if you should start having screenings again. Some women have medical problems that increase the chance of getting cervical cancer. In these cases, your health care provider may recommend that you have screening and Pap tests more often.  If you have a family history of uterine cancer or ovarian cancer, talk with your health care provider about genetic screening.  If you have vaginal bleeding after reaching menopause, tell your health care provider.  There are currently no reliable tests available to screen for ovarian cancer.  Lung Cancer Lung cancer screening is recommended for adults 62-66 years old who are at high risk for lung cancer because of a history of smoking. A yearly low-dose CT scan of the lungs is recommended if you:  Currently smoke.  Have a history of at least 30 pack-years of smoking and you currently smoke or  have quit within the past 15 years. A pack-year is smoking an average of one pack of cigarettes per day for one year.  Yearly screening should:  Continue until it has been 15 years since you quit.  Stop if you develop a health problem that would prevent you from having lung cancer treatment.  Colorectal Cancer  This type of cancer can be detected and can often be prevented.  Routine colorectal cancer screening usually begins at age 8 and continues through age 30.  If you have risk factors for colon cancer, your health care provider may recommend that you be screened at an earlier age.  If you have a family history of  colorectal cancer, talk with your health care provider about genetic screening.  Your health care provider may also recommend using home test kits to check for hidden blood in your stool.  A small camera at the end of a tube can be used to examine your colon directly (sigmoidoscopy or colonoscopy). This is done to check for the earliest forms of colorectal cancer.  Direct examination of the colon should be repeated every 5-10 years until age 27. However, if early forms of precancerous polyps or small growths are found or if you have a family history or genetic risk for colorectal cancer, you may need to be screened more often.  Skin Cancer  Check your skin from head to toe regularly.  Monitor any moles. Be sure to tell your health care provider: ? About any new moles or changes in moles, especially if there is a change in a mole's shape or color. ? If you have a mole that is larger than the size of a pencil eraser.  If any of your family members has a history of skin cancer, especially at a young age, talk with your health care provider about genetic screening.  Always use sunscreen. Apply sunscreen liberally and repeatedly throughout the day.  Whenever you are outside, protect yourself by wearing long sleeves, pants, a wide-brimmed hat, and sunglasses.  What should I know about osteoporosis? Osteoporosis is a condition in which bone destruction happens more quickly than new bone creation. After menopause, you may be at an increased risk for osteoporosis. To help prevent osteoporosis or the bone fractures that can happen because of osteoporosis, the following is recommended:  If you are 49-38 years old, get at least 1,000 mg of calcium and at least 600 mg of vitamin D per day.  If you are older than age 37 but younger than age 64, get at least 1,200 mg of calcium and at least 600 mg of vitamin D per day.  If you are older than age 101, get at least 1,200 mg of calcium and at least 800 mg  of vitamin D per day.  Smoking and excessive alcohol intake increase the risk of osteoporosis. Eat foods that are rich in calcium and vitamin D, and do weight-bearing exercises several times each week as directed by your health care provider. What should I know about how menopause affects my mental health? Depression may occur at any age, but it is more common as you become older. Common symptoms of depression include:  Low or sad mood.  Changes in sleep patterns.  Changes in appetite or eating patterns.  Feeling an overall lack of motivation or enjoyment of activities that you previously enjoyed.  Frequent crying spells.  Talk with your health care provider if you think that you are experiencing depression. What should I know  about immunizations? It is important that you get and maintain your immunizations. These include:  Tetanus, diphtheria, and pertussis (Tdap) booster vaccine.  Influenza every year before the flu season begins.  Pneumonia vaccine.  Shingles vaccine.  Your health care provider may also recommend other immunizations. This information is not intended to replace advice given to you by your health care provider. Make sure you discuss any questions you have with your health care provider. Document Released: 10/30/2005 Document Revised: 03/27/2016 Document Reviewed: 06/11/2015 Elsevier Interactive Patient Education  2018 [employer].

## 2017-12-10 LAB — IGP, COBASHPV16/18
HPV 16: NEGATIVE
HPV 18: NEGATIVE
HPV OTHER HR TYPES: NEGATIVE
PAP Smear Comment: 0

## 2017-12-13 ENCOUNTER — Ambulatory Visit: Payer: Self-pay | Admitting: Nurse Practitioner

## 2017-12-15 ENCOUNTER — Ambulatory Visit
Admission: RE | Admit: 2017-12-15 | Discharge: 2017-12-15 | Disposition: A | Discharge status: Home / Self Care | Payer: BC Managed Care – PPO | Source: Ambulatory Visit | Attending: Obstetrics and Gynecology | Admitting: Obstetrics and Gynecology

## 2017-12-15 DIAGNOSIS — Z1231 Encounter for screening mammogram for malignant neoplasm of breast: Secondary | ICD-10-CM | POA: Insufficient documentation

## 2017-12-15 DIAGNOSIS — Z1239 Encounter for other screening for malignant neoplasm of breast: Secondary | ICD-10-CM

## 2017-12-22 LAB — FECAL OCCULT BLOOD, IMMUNOCHEMICAL: FECAL OCCULT BLD: NEGATIVE

## 2018-01-04 ENCOUNTER — Ambulatory Visit: Payer: Self-pay | Admitting: Nurse Practitioner

## 2018-01-26 ENCOUNTER — Other Ambulatory Visit: Payer: Self-pay

## 2018-01-26 MED ORDER — SERTRALINE HCL 50 MG PO TABS: 50.0000 mg | ORAL_TABLET | Freq: Every day | ORAL | 2 refills | Status: DC

## 2018-01-28 ENCOUNTER — Encounter: Payer: Self-pay | Admitting: Nurse Practitioner

## 2018-01-28 ENCOUNTER — Ambulatory Visit: Payer: BC Managed Care – PPO | Admitting: Nurse Practitioner

## 2018-01-28 VITALS — BP 122/67 | HR 76 | Resp 16 | Ht 64.0 in | Wt 188.0 lb

## 2018-01-28 DIAGNOSIS — R5383 Other fatigue: Secondary | ICD-10-CM

## 2018-01-28 DIAGNOSIS — E039 Hypothyroidism, unspecified: Secondary | ICD-10-CM

## 2018-01-28 DIAGNOSIS — M25552 Pain in left hip: Secondary | ICD-10-CM | POA: Diagnosis not present

## 2018-01-28 DIAGNOSIS — I7300 Raynaud's syndrome without gangrene: Secondary | ICD-10-CM

## 2018-01-28 MED ORDER — PREDNISONE 10 MG (21) PO TBPK: ORAL_TABLET | ORAL | 0 refills | Status: DC

## 2018-01-28 NOTE — Progress Notes (Signed)
[facility], [place] [address]  Internal MEDICINE  Office Visit Note  Patient Name: Sherry Evans  [id number]  [id number]  Date of Service: 02/23/2018   Pt is here for routine follow up.   Chief Complaint  Patient presents with  . Hip Pain    The patient is concerned about left sided hip pain which ha been getting much worse over the past several weeks. Starting to hurt when she is sitting for longer than 5 minutes. Has to keep most of her weight on right side, as left side so uncomfortable. Pain increases when she stands up from a seated position as well. Has to get situated with balance before she can walk. Is having to wal with a limp favoring her left side.       Current Medication: Outpatient Encounter Medications as of 01/28/2018  Medication Sig  . azelastine (ASTELIN) 0.1 % nasal spray Place 2 sprays into both nostrils 2 (two) times daily as needed for allergies. Use in each nostril as directed  . cetirizine (ZYRTEC) 10 MG tablet Take 10 mg by mouth daily.  . Cholecalciferol (VITAMIN D3 SUPER STRENGTH) 2000 units TABS Take 2,000 Units by mouth daily.  Marland Kitchen conjugated estrogens (PREMARIN) vaginal cream Place 6.78 Applicatorfuls vaginally 2 (two) times a week.  . levothyroxine (SYNTHROID, LEVOTHROID) 100 MCG tablet Take 100 mcg by mouth daily before breakfast.   . lubiprostone (AMITIZA) 24 MCG capsule Take by mouth.  . montelukast (SINGULAIR) 10 MG tablet Take 10 mg by mouth at bedtime.   . Multiple Vitamins-Calcium (VIACTIV MULTI-VITAMIN) CHEW Chew 1 tablet by mouth daily.  Marland Kitchen nystatin-triamcinolone ointment (MYCOLOG) Apply 1 application topically 2 (two) times daily as needed.  . Probiotic Product (PROBIOTIC-10 PO) Take by mouth.  . ranitidine (ZANTAC) 150 MG tablet Take 150 mg by mouth 2 (two) times daily.  . sertraline (ZOLOFT) 50 MG tablet Take 1 tablet (50 mg total) by mouth daily.  . traZODone (DESYREL) 50 MG tablet Take 50 mg by mouth at  bedtime as needed for sleep.  . [DISCONTINUED] gabapentin (NEURONTIN) 600 MG tablet TAKE 2 TABLETS BY MOUTH WITH EVENING MEAL  . predniSONE (STERAPRED UNI-PAK 21 TAB) 10 MG (21) TBPK tablet 6 day taper - take by mouth as directed for 6 days   No facility-administered encounter medications on file as of 01/28/2018.     Surgical History: Past Surgical History:  Procedure Laterality Date  . adnoids removed  1969  . BACK SURGERY  2008  . BICEPT TENODESIS Right 04/02/2016   Procedure: BICEPS TENODESIS;  Surgeon: Corky Mull, MD;  Location: [facility];  Service: Orthopedics;  Laterality: Right;  . BLADDER SUSPENSION  2009  . BREAST BIOPSY Left 1990's   benign  . BREAST BIOPSY Left October 28, 2015   SMALL FRAGMENTS OF LYMPHOID TISSUE  . CHOLECYSTECTOMY    . COLONOSCOPY    . DILATION AND CURETTAGE OF UTERUS    . GASTRIC RESTRICTION SURGERY  08/23/15  . HERNIA REPAIR  2016   hiatal  . JOINT REPLACEMENT Bilateral 2014   Partial Knee Replacement,  Surgeon: Dr. Little Ishikawa, Brooke Bonito. [place] [facility]  2014  . SHOULDER ARTHROSCOPY WITH DEBRIDEMENT AND BICEP TENDON REPAIR Right 04/02/2016   Procedure: SHOULDER ARTHROSCOPY WITH DEBRIDEMENT ;  Surgeon: Corky Mull, MD;  Location: [facility];  Service: Orthopedics;  Laterality: Right;  . SHOULDER ARTHROSCOPY WITH OPEN ROTATOR CUFF REPAIR Right 04/02/2016   Procedure: SHOULDER  ARTHROSCOPY WITH OPEN ROTATOR CUFF REPAIR;  Surgeon: Corky Mull, MD;  Location: [facility];  Service: Orthopedics;  Laterality: Right;  . SHOULDER ARTHROSCOPY WITH SUBACROMIAL DECOMPRESSION Right 04/02/2016   Procedure: SHOULDER ARTHROSCOPY WITH SUBACROMIAL DECOMPRESSION;  Surgeon: Corky Mull, MD;  Location: [facility];  Service: Orthopedics;  Laterality: Right;    Medical History: Past Medical History:  Diagnosis Date  . Anxiety   . Depression   . GERD (gastroesophageal reflux disease)   . History of sinus surgery    3 times  . Hypothyroidism   . Sleep apnea    use  C-PAP    Family History: Family History  Problem Relation Age of Onset  . Hypertension Father   . Diabetes Father   . Breast cancer Maternal Aunt        great aunts. 70-80  . Breast cancer Maternal Grandmother 65       metatastic to breast  . Ovarian cancer Maternal Grandmother   . Breast cancer Paternal Grandmother        70's  . Colon cancer Neg Hx     Social History   Socioeconomic History  . Marital status: Married    Spouse name: Not on file  . Number of children: Not on file  . Years of education: Not on file  . Highest education level: Not on file  Occupational History  . Not on file  Social Needs  . Financial resource strain: Not on file  . Food insecurity:    Worry: Not on file    Inability: Not on file  . Transportation needs:    Medical: Not on file    Non-medical: Not on file  Tobacco Use  . Smoking status: Never Smoker  . Smokeless tobacco: Never Used  Substance and Sexual Activity  . Alcohol use: No    Alcohol/week: 0.0 oz  . Drug use: No  . Sexual activity: Yes    Birth control/protection: Post-menopausal  Lifestyle  . Physical activity:    Days per week: 0 days    Minutes per session: 0 min  . Stress: Not on file  Relationships  . Social connections:    Talks on phone: Not on file    Gets together: Not on file    Attends religious service: Not on file    Active member of club or organization: Not on file    Attends meetings of clubs or organizations: Not on file    Relationship status: Not on file  . Intimate partner violence:    Fear of current or ex partner: Not on file    Emotionally abused: Not on file    Physically abused: Not on file    Forced sexual activity: Not on file  Other Topics Concern  . Not on file  Social History Narrative  . Not on file      Review of Systems  Constitutional: Positive for fatigue. Negative for chills and unexpected weight change.  HENT: Negative for congestion, postnasal drip, rhinorrhea,  sneezing and sore throat.   Eyes: Negative.  Negative for redness.  Respiratory: Negative for cough, chest tightness, shortness of breath and wheezing.   Cardiovascular: Negative for chest pain and palpitations.  Gastrointestinal: Negative for abdominal pain, constipation, diarrhea, nausea and vomiting.  Endocrine: Negative for cold intolerance, heat intolerance, polydipsia, polyphagia and polyuria.  Genitourinary: Negative for dysuria and frequency.  Musculoskeletal: Positive for arthralgias and myalgias. Negative for back pain, joint swelling and neck pain.  Pain and discomfort concentrated in left hip area.   Skin: Negative for rash.  Allergic/Immunologic: Negative for environmental allergies.  Neurological: Negative for dizziness, tremors, weakness, numbness and headaches.  Hematological: Negative for adenopathy. Does not bruise/bleed easily.  Psychiatric/Behavioral: Negative for behavioral problems (Depression), sleep disturbance and suicidal ideas. The patient is not nervous/anxious.     Today's Vitals   01/28/18 1553  BP: 122/67  Pulse: 76  Resp: 16  SpO2: 100%  Weight: 188 lb (85.3 kg)  Height: 5\' 4"  (1.626 m)    Physical Exam  Constitutional: She is oriented to person, place, and time. She appears well-developed and well-nourished. No distress.  HENT:  Head: Normocephalic and atraumatic.  Mouth/Throat: Oropharynx is clear and moist. No oropharyngeal exudate.  Eyes: Pupils are equal, round, and reactive to light. Conjunctivae and EOM are normal.  Neck: Normal range of motion. Neck supple. No JVD present. Carotid bruit is not present. No tracheal deviation present. No thyromegaly present.  Cardiovascular: Normal rate, regular rhythm and normal heart sounds. Exam reveals no gallop and no friction rub.  No murmur heard. Pulmonary/Chest: Effort normal and breath sounds normal. No respiratory distress. She has no wheezes. She has no rales. She exhibits no tenderness.   Abdominal: Soft. Bowel sounds are normal. There is no tenderness.  Musculoskeletal: Normal range of motion.  There is moderate tenderness of left hip, mostly at lateral aspect of the joint. Patient is able to flex and extend the hip without difficulty. Pain is worse when sitting and applying weight on the hip. Discomfort evident when seated throughout the visit. Shifting weight onto right hip as much as possible. No visible or palpable abnormalities are evident today.   Lymphadenopathy:    She has no cervical adenopathy.  Neurological: She is alert and oriented to person, place, and time. No cranial nerve deficit.  Skin: Skin is warm and dry. She is not diaphoretic.  Psychiatric: She has a normal mood and affect. Her behavior is normal. Judgment and thought content normal.  Nursing note and vitals reviewed.  Assessment/Plan: 1. Pain in left hip Recommend use of OTC Tylenol/NSAIDs to reduce pain and inflammation. Add prednisone 10mg  dose pack - take as directed for 6 days. Apply heat in 20 minute intervals to help soothe tiht muscles. Will check connective tissue panel and will get x-ray of left hip for further evaluation. Refer to ortho as needed and as indicated.  - ANA w/Reflex if Positive - Rheumatoid factor - Sedimentation rate - Uric acid - predniSONE (STERAPRED UNI-PAK 21 TAB) 10 MG (21) TBPK tablet; 6 day taper - take by mouth as directed for 6 days  Dispense: 21 tablet; Refill: 0 - DG HIP UNILAT WITH PELVIS 2-3 VIEWS LEFT; Future  2. Fatigue, unspecified type - ANA w/Reflex if Positive - Rheumatoid factor - TSH + free T4  3. Raynaud's phenomenon without gangrene - ANA w/Reflex if Positive - Rheumatoid factor - TSH + free T4  4. Acquired hypothyroidism Check thyroid panel and adjust levothyroxine as indicated.   General Counseling: Sami verbalizes understanding of the findings of todays visit and agrees with plan of treatment. I have discussed any further diagnostic  evaluation that may be needed or ordered today. We also reviewed her medications today. she has been encouraged to call the office with any questions or concerns that should arise related to todays visit.    Counseling:  This patient was seen by Leretha Pol, FNP- C in Collaboration with Dr Lavera Guise as  a part of collaborative care agreement  Orders Placed This Encounter  Procedures  . DG HIP UNILAT WITH PELVIS 2-3 VIEWS LEFT  . ANA w/Reflex if Positive  . Rheumatoid factor  . Sedimentation rate  . Uric acid  . TSH + free T4    Meds ordered this encounter  Medications  . predniSONE (STERAPRED UNI-PAK 21 TAB) 10 MG (21) TBPK tablet    Sig: 6 day taper - take by mouth as directed for 6 days    Dispense:  21 tablet    Refill:  0    Order Specific Question:   Supervising Provider    Answer:   Lavera Guise [[id number]]    Time spent: 25 Minutes      Dr Lavera Guise Internal medicine

## 2018-01-29 ENCOUNTER — Other Ambulatory Visit: Payer: Self-pay | Admitting: Internal Medicine

## 2018-01-31 ENCOUNTER — Ambulatory Visit
Admission: RE | Admit: 2018-01-31 | Discharge: 2018-01-31 | Disposition: A | Discharge status: Home / Self Care | Payer: BC Managed Care – PPO | Source: Ambulatory Visit | Attending: Nurse Practitioner | Admitting: Nurse Practitioner

## 2018-01-31 DIAGNOSIS — M25552 Pain in left hip: Secondary | ICD-10-CM | POA: Insufficient documentation

## 2018-02-01 ENCOUNTER — Telehealth: Payer: Self-pay

## 2018-02-01 NOTE — Telephone Encounter (Signed)
Pt was notified of mild degenerative changes in hip. She would like a referral and message was sent to [facility].

## 2018-02-02 LAB — ANA W/REFLEX IF POSITIVE: ANA: NEGATIVE

## 2018-02-02 LAB — SEDIMENTATION RATE: Sed Rate: 12 mm/hr (ref 0–40)

## 2018-02-02 LAB — URIC ACID: Uric Acid: 2.1 mg/dL — ABNORMAL LOW (ref 2.5–7.1)

## 2018-02-02 LAB — TSH+FREE T4
Free T4: 1.32 ng/dL (ref 0.82–1.77)
TSH: 0.605 u[IU]/mL (ref 0.450–4.500)

## 2018-02-02 LAB — RHEUMATOID FACTOR

## 2018-02-23 DIAGNOSIS — R5383 Other fatigue: Secondary | ICD-10-CM | POA: Insufficient documentation

## 2018-02-23 DIAGNOSIS — M25552 Pain in left hip: Secondary | ICD-10-CM | POA: Insufficient documentation

## 2018-02-23 DIAGNOSIS — I7300 Raynaud's syndrome without gangrene: Secondary | ICD-10-CM | POA: Insufficient documentation

## 2018-04-20 ENCOUNTER — Other Ambulatory Visit: Payer: Self-pay | Admitting: Internal Medicine

## 2018-06-26 ENCOUNTER — Other Ambulatory Visit: Payer: Self-pay | Admitting: Internal Medicine

## 2018-06-29 ENCOUNTER — Other Ambulatory Visit: Payer: Self-pay | Admitting: Internal Medicine

## 2018-06-30 ENCOUNTER — Other Ambulatory Visit: Payer: Self-pay | Admitting: Nurse Practitioner

## 2018-06-30 MED ORDER — GABAPENTIN 600 MG PO TABS: ORAL_TABLET | ORAL | 1 refills | Status: DC

## 2018-06-30 MED ORDER — SERTRALINE HCL 50 MG PO TABS: 50.0000 mg | ORAL_TABLET | Freq: Every day | ORAL | 0 refills | Status: DC

## 2018-08-16 ENCOUNTER — Encounter: Payer: Self-pay | Admitting: Nurse Practitioner

## 2018-08-16 ENCOUNTER — Ambulatory Visit (INDEPENDENT_AMBULATORY_CARE_PROVIDER_SITE_OTHER): Payer: BC Managed Care – PPO | Admitting: Nurse Practitioner

## 2018-08-16 VITALS — BP 111/69 | HR 78 | Resp 16 | Ht 64.0 in | Wt 189.0 lb

## 2018-08-16 DIAGNOSIS — F321 Major depressive disorder, single episode, moderate: Secondary | ICD-10-CM

## 2018-08-16 DIAGNOSIS — K581 Irritable bowel syndrome with constipation: Secondary | ICD-10-CM | POA: Diagnosis not present

## 2018-08-16 DIAGNOSIS — N926 Irregular menstruation, unspecified: Secondary | ICD-10-CM | POA: Insufficient documentation

## 2018-08-16 DIAGNOSIS — B019 Varicella without complication: Secondary | ICD-10-CM | POA: Insufficient documentation

## 2018-08-16 DIAGNOSIS — E039 Hypothyroidism, unspecified: Secondary | ICD-10-CM

## 2018-08-16 DIAGNOSIS — Z0001 Encounter for general adult medical examination with abnormal findings: Secondary | ICD-10-CM | POA: Diagnosis not present

## 2018-08-16 DIAGNOSIS — Z9109 Other allergy status, other than to drugs and biological substances: Secondary | ICD-10-CM | POA: Insufficient documentation

## 2018-08-16 NOTE — Progress Notes (Signed)
[facility] [place], [place] [address]  Internal MEDICINE  Office Visit Note  Patient Name: Sherry Evans  [id number]  [id number]  Date of Service: 08/20/2018   Pt is here for routine health maintenance examination  Chief Complaint  Patient presents with  . Annual Exam    medication refill  . Gastroesophageal Reflux  . Depression  . Hypothyroidism     The patient is here for health maintenance exam. She has no physical concerns or complaints to discuss today. Doing well with all current medications. The patient had routine fating labs done in 01/2018, and were essentially normal. Her mammogram was performed 3/2.19 and was negative.     Current Medication: Outpatient Encounter Medications as of 08/16/2018  Medication Sig  . AMITIZA 24 MCG capsule TAKE ONE CAPSULE BY MOUTH TWICE A DAY  . azelastine (ASTELIN) 0.1 % nasal spray Place 2 sprays into both nostrils 2 (two) times daily as needed for allergies. Use in each nostril as directed  . cetirizine (ZYRTEC) 10 MG tablet Take 10 mg by mouth daily.  . Cholecalciferol (VITAMIN D3 SUPER STRENGTH) 2000 units TABS Take 2,000 Units by mouth daily.  Marland Kitchen conjugated estrogens (PREMARIN) vaginal cream Place 2.95 Applicatorfuls vaginally 2 (two) times a week.  . gabapentin (NEURONTIN) 600 MG tablet TAKE 2 TABLETS BY MOUTH AT EVENING MEAL  . levothyroxine (SYNTHROID, LEVOTHROID) 100 MCG tablet TAKE 1 TABLET BY MOUTH DAILY  . montelukast (SINGULAIR) 10 MG tablet TAKE 1 TABLET BY MOUTH DAILY FOR ALLERGIES  . Multiple Vitamins-Calcium (VIACTIV MULTI-VITAMIN) CHEW Chew 1 tablet by mouth daily.  Marland Kitchen nystatin-triamcinolone ointment (MYCOLOG) Apply 1 application topically 2 (two) times daily as needed.  . Probiotic Product (PROBIOTIC-10 PO) Take by mouth.  . ranitidine (ZANTAC) 150 MG tablet Take 150 mg by mouth 2 (two) times daily.  . sertraline (ZOLOFT) 50 MG tablet Take 1 tablet (50 mg total) by mouth daily.  .  traZODone (DESYREL) 50 MG tablet TAKE 1 TABLET BY MOUTH AT BEDTIME AS NEEDED  . [DISCONTINUED] predniSONE (STERAPRED UNI-PAK 21 TAB) 10 MG (21) TBPK tablet 6 day taper - take by mouth as directed for 6 days   No facility-administered encounter medications on file as of 08/16/2018.     Surgical History: Past Surgical History:  Procedure Laterality Date  . adnoids removed  1969  . BACK SURGERY  2008  . BICEPT TENODESIS Right 04/02/2016   Procedure: BICEPS TENODESIS;  Surgeon: Corky Mull, MD;  Location: [facility];  Service: Orthopedics;  Laterality: Right;  . BLADDER SUSPENSION  2009  . BREAST BIOPSY Left 1990's   benign  . BREAST BIOPSY Left October 28, 2015   SMALL FRAGMENTS OF LYMPHOID TISSUE  . CHOLECYSTECTOMY    . COLONOSCOPY    . DILATION AND CURETTAGE OF UTERUS    . GASTRIC RESTRICTION SURGERY  08/23/15  . HERNIA REPAIR  2016   hiatal  . JOINT REPLACEMENT Bilateral 2014   Partial Knee Replacement,  Surgeon: Dr. Little Ishikawa, Brooke Bonito. [place] [facility]  2014  . SHOULDER ARTHROSCOPY WITH DEBRIDEMENT AND BICEP TENDON REPAIR Right 04/02/2016   Procedure: SHOULDER ARTHROSCOPY WITH DEBRIDEMENT ;  Surgeon: Corky Mull, MD;  Location: [facility];  Service: Orthopedics;  Laterality: Right;  . SHOULDER ARTHROSCOPY WITH OPEN ROTATOR CUFF REPAIR Right 04/02/2016   Procedure: SHOULDER ARTHROSCOPY WITH OPEN ROTATOR CUFF REPAIR;  Surgeon: Corky Mull, MD;  Location: [facility];  Service: Orthopedics;  Laterality: Right;  .  SHOULDER ARTHROSCOPY WITH SUBACROMIAL DECOMPRESSION Right 04/02/2016   Procedure: SHOULDER ARTHROSCOPY WITH SUBACROMIAL DECOMPRESSION;  Surgeon: Corky Mull, MD;  Location: [facility];  Service: Orthopedics;  Laterality: Right;    Medical History: Past Medical History:  Diagnosis Date  . Anxiety   . Depression   . GERD (gastroesophageal reflux disease)   . History of sinus surgery    3 times  . Hypothyroidism   . Sleep apnea    use C-PAP    Family  History: Family History  Problem Relation Age of Onset  . Hypertension Father   . Diabetes Father   . Breast cancer Maternal Aunt        great aunts. 70-80  . Breast cancer Maternal Grandmother 64       metatastic to breast  . Ovarian cancer Maternal Grandmother   . Breast cancer Paternal Grandmother        37's  . Colon cancer Neg Hx       Review of Systems  Constitutional: Negative for chills, fatigue and unexpected weight change.  HENT: Negative for congestion, postnasal drip, rhinorrhea, sneezing and sore throat.   Eyes: Negative.   Respiratory: Negative for cough, chest tightness, shortness of breath and wheezing.   Cardiovascular: Negative for chest pain and palpitations.  Gastrointestinal: Negative for abdominal pain, constipation, diarrhea, nausea and vomiting.  Endocrine: Negative for cold intolerance, heat intolerance, polydipsia, polyphagia and polyuria.       Most recent thyroid panel was within normal limits.   Genitourinary: Negative for dysuria and frequency.  Musculoskeletal: Negative for arthralgias, back pain, joint swelling, myalgias and neck pain.  Skin: Negative for rash.  Allergic/Immunologic: Negative for environmental allergies.  Neurological: Negative for dizziness, tremors, weakness, numbness and headaches.  Hematological: Negative for adenopathy. Does not bruise/bleed easily.  Psychiatric/Behavioral: Positive for dysphoric mood. Negative for behavioral problems (Depression), sleep disturbance and suicidal ideas. The patient is nervous/anxious.        Well controlled on her current medications.      Today's Vitals   08/16/18 1549  BP: 111/69  Pulse: 78  Resp: 16  SpO2: 97%  Weight: 189 lb (85.7 kg)  Height: 5\' 4"  (1.626 m)    Physical Exam  Constitutional: She is oriented to person, place, and time. She appears well-developed and well-nourished. No distress.  HENT:  Head: Normocephalic and atraumatic.  Nose: Nose normal.  Mouth/Throat:  Oropharynx is clear and moist. No oropharyngeal exudate.  Eyes: Pupils are equal, round, and reactive to light. Conjunctivae and EOM are normal.  Neck: Normal range of motion. Neck supple. No JVD present. Carotid bruit is not present. No tracheal deviation present. No thyromegaly present.  Cardiovascular: Normal rate, regular rhythm, normal heart sounds and intact distal pulses. Exam reveals no gallop and no friction rub.  No murmur heard. Pulmonary/Chest: Effort normal and breath sounds normal. No respiratory distress. She has no wheezes. She has no rales. She exhibits no tenderness. Right breast exhibits no inverted nipple, no mass, no nipple discharge, no skin change and no tenderness. Left breast exhibits no inverted nipple, no mass, no nipple discharge, no skin change and no tenderness.  Abdominal: Soft. Bowel sounds are normal. There is no tenderness.  Musculoskeletal: Normal range of motion.  There is moderate tenderness of left hip, mostly at lateral aspect of the joint. Patient is able to flex and extend the hip without difficulty. Pain is worse when sitting and applying weight on the hip. Discomfort evident when seated throughout the  visit. Shifting weight onto right hip as much as possible. No visible or palpable abnormalities are evident today.   Lymphadenopathy:    She has no cervical adenopathy.  Neurological: She is alert and oriented to person, place, and time. No cranial nerve deficit.  Skin: Skin is warm and dry. Capillary refill takes less than 2 seconds. She is not diaphoretic.  Psychiatric: She has a normal mood and affect. Her behavior is normal. Judgment and thought content normal.  Nursing note and vitals reviewed.  Assessment/Plan: 1. Encounter for general adult medical examination with abnormal findings Annual health maintenance exam today  2. Acquired hypothyroidism Stable. Continue levothyroxine as prescribed .  3. Irritable bowel syndrome with constipation Doing  well. Continue with amitiza 80mcg twice daily as prescribed  4. Depression, major, single episode, moderate (HCC) Stable. Continue sertraline and trazodone as prescribed. Will refill as needed .  General Counseling: Sherry Evans verbalizes understanding of the findings of todays visit and agrees with plan of treatment. I have discussed any further diagnostic evaluation that may be needed or ordered today. We also reviewed her medications today. she has been encouraged to call the office with any questions or concerns that should arise related to todays visit.    Counseling:  This patient was seen by Leretha Pol FNP Collaboration with Dr Lavera Guise as a part of collaborative care agreement   Time spent: [place], MD  Internal Medicine

## 2018-08-17 ENCOUNTER — Other Ambulatory Visit: Payer: Self-pay | Admitting: Nurse Practitioner

## 2018-08-18 LAB — B12 AND FOLATE PANEL
Folate: 16.8 ng/mL (ref >3.0)
Vitamin B-12: 1013 pg/mL (ref 232–1245)

## 2018-08-18 LAB — IRON AND TIBC
IRON SATURATION: 35 % (ref 15–55)
Iron: 88 ug/dL (ref 27–159)
Total Iron Binding Capacity: 250 ug/dL (ref 250–450)
UIBC: 162 ug/dL (ref 131–425)

## 2018-08-18 LAB — COMPREHENSIVE METABOLIC PANEL
A/G RATIO: 2.2 (ref 1.2–2.2)
ALK PHOS: 62 IU/L (ref 39–117)
ALT: 16 IU/L (ref 0–32)
AST: 21 IU/L (ref 0–40)
Albumin: 4.3 g/dL (ref 3.5–5.5)
BILIRUBIN TOTAL: 0.5 mg/dL (ref 0.0–1.2)
BUN / CREAT RATIO: 26 — AB (ref 9–23)
BUN: 21 mg/dL (ref 6–24)
CHLORIDE: 104 mmol/L (ref 96–106)
CO2: 25 mmol/L (ref 20–29)
Calcium: 9.4 mg/dL (ref 8.7–10.2)
Creatinine, Ser: 0.8 mg/dL (ref 0.57–1.00)
GFR calc non Af Amer: 83 mL/min/{1.73_m2} (ref >59)
GFR, EST AFRICAN AMERICAN: 96 mL/min/{1.73_m2} (ref >59)
Globulin, Total: 2 g/dL (ref 1.5–4.5)
Glucose: 92 mg/dL (ref 65–99)
POTASSIUM: 4 mmol/L (ref 3.5–5.2)
Sodium: 141 mmol/L (ref 134–144)
Total Protein: 6.3 g/dL (ref 6.0–8.5)

## 2018-08-18 LAB — CBC
HEMOGLOBIN: 13 g/dL (ref 11.1–15.9)
Hematocrit: 38.4 % (ref 34.0–46.6)
MCH: 31.9 pg (ref 26.6–33.0)
MCHC: 33.9 g/dL (ref 31.5–35.7)
MCV: 94 fL (ref 79–97)
Platelets: 253 10*3/uL (ref 150–450)
RBC: 4.07 x10E6/uL (ref 3.77–5.28)
RDW: 11.9 % — AB (ref 12.3–15.4)
WBC: 4.4 10*3/uL (ref 3.4–10.8)

## 2018-08-18 LAB — LIPID PANEL W/O CHOL/HDL RATIO
CHOLESTEROL TOTAL: 176 mg/dL (ref 100–199)
HDL: 63 mg/dL (ref >39)
LDL Calculated: 99 mg/dL (ref 0–99)
TRIGLYCERIDES: 68 mg/dL (ref 0–149)
VLDL Cholesterol Cal: 14 mg/dL (ref 5–40)

## 2018-08-18 LAB — PHOSPHORUS: Phosphorus: 3.7 mg/dL (ref 2.5–4.5)

## 2018-08-18 LAB — HGB A1C W/O EAG: Hgb A1c MFr Bld: 5.5 % (ref 4.8–5.6)

## 2018-08-18 LAB — T3: T3, Total: 84 ng/dL (ref 71–180)

## 2018-08-18 LAB — FERRITIN: Ferritin: 44 ng/mL (ref 15–150)

## 2018-08-18 LAB — VITAMIN D 25 HYDROXY (VIT D DEFICIENCY, FRACTURES): Vit D, 25-Hydroxy: 37.9 ng/mL (ref 30.0–100.0)

## 2018-08-18 LAB — T4, FREE: Free T4: 1.08 ng/dL (ref 0.82–1.77)

## 2018-08-18 LAB — TSH: TSH: 2.03 u[IU]/mL (ref 0.450–4.500)

## 2018-08-18 LAB — MAGNESIUM: Magnesium: 2.1 mg/dL (ref 1.6–2.3)

## 2018-08-20 DIAGNOSIS — Z0001 Encounter for general adult medical examination with abnormal findings: Secondary | ICD-10-CM | POA: Insufficient documentation

## 2018-08-20 DIAGNOSIS — F321 Major depressive disorder, single episode, moderate: Secondary | ICD-10-CM | POA: Insufficient documentation

## 2018-08-20 DIAGNOSIS — K581 Irritable bowel syndrome with constipation: Secondary | ICD-10-CM | POA: Insufficient documentation

## 2018-10-03 ENCOUNTER — Other Ambulatory Visit: Payer: Self-pay

## 2018-10-03 MED ORDER — SERTRALINE HCL 50 MG PO TABS: 50.0000 mg | ORAL_TABLET | Freq: Every day | ORAL | 0 refills | Status: DC

## 2018-10-21 ENCOUNTER — Ambulatory Visit: Payer: BC Managed Care – PPO | Admitting: Adult Health

## 2018-10-21 ENCOUNTER — Encounter: Payer: Self-pay | Admitting: Adult Health

## 2018-10-21 VITALS — BP 115/71 | HR 71 | Resp 16 | Ht 64.0 in | Wt 191.0 lb

## 2018-10-21 DIAGNOSIS — D229 Melanocytic nevi, unspecified: Secondary | ICD-10-CM

## 2018-10-21 DIAGNOSIS — R21 Rash and other nonspecific skin eruption: Secondary | ICD-10-CM

## 2018-10-21 MED ORDER — HYDROCORTISONE 1 % EX OINT: 1.0000 "application " | TOPICAL_OINTMENT | Freq: Two times a day (BID) | CUTANEOUS | 0 refills | Status: DC

## 2018-10-21 MED ORDER — KETOCONAZOLE 2 % EX CREA: 1.0000 "application " | TOPICAL_CREAM | Freq: Every day | CUTANEOUS | 0 refills | Status: DC

## 2018-10-21 NOTE — Patient Instructions (Signed)
Mole  A mole is a colored (pigmented) growth on the skin. Moles are very common. They are usually harmless, but some moles can become cancerous over time.  What are the causes?  Moles are caused when pigmented skin cells grow together in clusters instead of spreading out in the skin as they normally do. The reason why the skin cells grow together in clusters is not known.  What increases the risk?  You are more likely to develop a mole if you:  · Have family members who have moles.  · Are white.  · Have blond hair.  · Are often outdoors and exposed to the sun.  · Received phototherapy when you were a newborn baby.  · Are female.  What are the signs or symptoms?  A mole may be:  · Brown or black.  · Flat or raised.  · Smooth or wrinkled.  How is this diagnosed?  A mole is diagnosed with a skin exam. If your health care provider thinks a mole may be cancerous, all or part of the mole will be removed for testing (biopsy).  How is this treated?  Most moles are noncancerous (benign) and do not require treatment. If a mole is found to be cancerous, it will be removed. You may also choose to have a mole removed if it is causing pain or if you do not like the way it looks.  Follow these instructions at home:    General instructions    · Every month, look for new moles and check your existing moles for changes. This is important because a change in a mole can mean that the mole has become cancerous.  · ABCDE changes in a mole indicate that you should be evaluated by your health care provider. ABCDE stands for:  ? Asymmetry. This means the mole has an irregular shape. It is not round or oval.  ? Border. This means the mole has an irregular or bumpy border.  ? Color. This means the mole has multiple colors in it, including brown, black, blue, red, or tan. Note that it is normal for moles to get darker when a woman is pregnant or takes birth control pills.  ? Diameter. This means the mole is more than 0.2 inches (6 mm)  across.  ? Evolving. This refers to any unusual changes or symptoms in the mole, such as pain, itching, stinging, sensitivity, or bleeding.  · If you have a large number of moles, see a skin doctor (dermatologist) at least one time every year for a full-body skin check.  Lifestyle  · When you are outdoors, wear sunscreen with SPF 30 (sun protection factor 30) or higher.  · Use an adequate amount of sunscreen to cover exposed areas of skin. Put it on 30 minutes before you go out. Reapply it every 2 hours or anytime you come out of the water.  · When you are out in the sun, wear a broad-brimmed hat and clothing that covers your arms and legs. Wear wraparound sunglasses.  Contact a health care provider if:  · The size, shape, borders, or color of your mole changes.  · Your mole, or the skin near the mole, becomes painful, sore, red, or swollen.  · Your mole:  ? Develops more than one color.  ? Itches or bleeds.  ? Becomes scaly, sheds skin, or oozes fluid.  ? Becomes flat or develops raised areas.  ? Becomes hard or soft.  · You develop a new   mole.  Summary  · A mole is a colored (pigmented) growth on the skin. Moles are very common. They are usually harmless, but some moles can become cancerous over time.  · Every month, look for new moles and check your existing moles for changes. This is important because a change in a mole can mean that the mole has become cancerous.  · If you have a large number of moles, see a skin doctor (dermatologist) at least one time every year for a full-body skin check.  · When you are outdoors, wear sunscreen with SPF 30 (sun protection factor 30) or higher. Reapply it every 2 hours or anytime you come out of the water.  · Contact a health care provider if you notice changes in a mole or if you develop a new mole.  This information is not intended to replace advice given to you by your health care provider. Make sure you discuss any questions you have with your health care  provider.  Document Released: 06/02/2001 Document Revised: 02/01/2018 Document Reviewed: 02/01/2018  Elsevier Interactive Patient Education © 2019 [employer]

## 2018-10-21 NOTE — Progress Notes (Signed)
[facility] [place], [place] [address]  Internal MEDICINE  Office Visit Note  Patient Name: Sherry Evans  [id number]  [id number]  Date of Service: 11/11/2018  Chief Complaint  Patient presents with  . Referral    Mole evaluation underneath the right breast, mole seens to have gotten bigger and darker. not painful, possible referral to dermatologist,      HPI Pt is here for a sick visit.  Patient is here complaining of a mole underneath her right breast that seems to gotten bigger and darker.  Patient denies any itching or pain at the site.  She does have a few questionable moles that she seen concerned about and is wanting a dermatology referral at this time.     Current Medication:  Outpatient Encounter Medications as of 10/21/2018  Medication Sig  . AMITIZA 24 MCG capsule TAKE ONE CAPSULE BY MOUTH TWICE A DAY  . azelastine (ASTELIN) 0.1 % nasal spray Place 2 sprays into both nostrils 2 (two) times daily as needed for allergies. Use in each nostril as directed  . cetirizine (ZYRTEC) 10 MG tablet Take 10 mg by mouth daily.  . Cholecalciferol (VITAMIN D3 SUPER STRENGTH) 2000 units TABS Take 2,000 Units by mouth daily.  Marland Kitchen conjugated estrogens (PREMARIN) vaginal cream Place 9.79 Applicatorfuls vaginally 2 (two) times a week.  . gabapentin (NEURONTIN) 600 MG tablet TAKE 2 TABLETS BY MOUTH AT EVENING MEAL  . levothyroxine (SYNTHROID, LEVOTHROID) 100 MCG tablet TAKE 1 TABLET BY MOUTH DAILY  . montelukast (SINGULAIR) 10 MG tablet TAKE 1 TABLET BY MOUTH DAILY FOR ALLERGIES  . Multiple Vitamins-Calcium (VIACTIV MULTI-VITAMIN) CHEW Chew 1 tablet by mouth daily.  Marland Kitchen nystatin-triamcinolone ointment (MYCOLOG) Apply 1 application topically 2 (two) times daily as needed.  . Probiotic Product (PROBIOTIC-10 PO) Take by mouth.  . ranitidine (ZANTAC) 150 MG tablet Take 150 mg by mouth 2 (two) times daily.  . sertraline (ZOLOFT) 50 MG tablet Take 1 tablet (50 mg total)  by mouth daily.  . traZODone (DESYREL) 50 MG tablet TAKE 1 TABLET BY MOUTH AT BEDTIME AS NEEDED  . hydrocortisone 1 % ointment Apply 1 application topically 2 (two) times daily.  Marland Kitchen ketoconazole (NIZORAL) 2 % cream Apply 1 application topically daily.   No facility-administered encounter medications on file as of 10/21/2018.       Medical History: Past Medical History:  Diagnosis Date  . Anxiety   . Depression   . GERD (gastroesophageal reflux disease)   . History of sinus surgery    3 times  . Hypothyroidism   . Sleep apnea    use C-PAP     Vital Signs: BP 115/71 (BP Location: Right Arm, Patient Position: Sitting, Cuff Size: Large)   Pulse 71   Resp 16   Ht 5\' 4"  (1.626 m)   Wt 191 lb (86.6 kg)   LMP 01/03/2016   SpO2 100%   BMI 32.79 kg/m    Review of Systems  Constitutional: Negative for chills, fatigue and unexpected weight change.  HENT: Negative for congestion, rhinorrhea, sneezing and sore throat.   Eyes: Negative for photophobia, pain and redness.  Respiratory: Negative for cough, chest tightness and shortness of breath.   Cardiovascular: Negative for chest pain and palpitations.  Gastrointestinal: Negative for abdominal pain, constipation, diarrhea, nausea and vomiting.  Endocrine: Negative.   Genitourinary: Negative for dysuria and frequency.  Musculoskeletal: Negative for arthralgias, back pain, joint swelling and neck pain.  Skin: Negative for rash.  Allergic/Immunologic: Negative.   Neurological: Negative for tremors and numbness.  Hematological: Negative for adenopathy. Does not bruise/bleed easily.  Psychiatric/Behavioral: Negative for behavioral problems and sleep disturbance. The patient is not nervous/anxious.     Physical Exam Vitals signs and nursing note reviewed.  Constitutional:      General: She is not in acute distress.    Appearance: She is well-developed. She is not diaphoretic.  HENT:     Head: Normocephalic and atraumatic.      Mouth/Throat:     Pharynx: No oropharyngeal exudate.  Eyes:     Pupils: Pupils are equal, round, and reactive to light.  Neck:     Musculoskeletal: Normal range of motion and neck supple.     Thyroid: No thyromegaly.     Vascular: No JVD.     Trachea: No tracheal deviation.  Cardiovascular:     Rate and Rhythm: Normal rate and regular rhythm.     Heart sounds: Normal heart sounds. No murmur. No friction rub. No gallop.   Pulmonary:     Effort: Pulmonary effort is normal. No respiratory distress.     Breath sounds: Normal breath sounds. No wheezing or rales.  Chest:     Chest wall: No tenderness.  Abdominal:     Palpations: Abdomen is soft.     Tenderness: There is no abdominal tenderness. There is no guarding.  Musculoskeletal: Normal range of motion.  Lymphadenopathy:     Cervical: No cervical adenopathy.  Skin:    General: Skin is warm and dry.     Comments: Small raised, scaly patch approx 0.5cmx0.5cm under right breast.  Neurological:     Mental Status: She is alert and oriented to person, place, and time.     Cranial Nerves: No cranial nerve deficit.  Psychiatric:        Behavior: Behavior normal.        Thought Content: Thought content normal.        Judgment: Judgment normal.    Assessment/Plan: 1. Rash and nonspecific skin eruption Some small raised scaly areas that do not seem to be singular nevi, will treat with some topical hydrocortisone and ketoconazole cream until dermatology can see them and assess them.  - hydrocortisone 1 % ointment; Apply 1 application topically 2 (two) times daily.  Dispense: 30 g; Refill: 0 - ketoconazole (NIZORAL) 2 % cream; Apply 1 application topically daily.  Dispense: 15 g; Refill: 0  2. Atypical mole Referral to dermatology for atypical moles. - Ambulatory referral to Dermatology  General Counseling: Ayodele verbalizes understanding of the findings of todays visit and agrees with plan of treatment. I have discussed any further  diagnostic evaluation that may be needed or ordered today. We also reviewed her medications today. she has been encouraged to call the office with any questions or concerns that should arise related to todays visit.   Orders Placed This Encounter  Procedures  . Ambulatory referral to Dermatology    Meds ordered this encounter  Medications  . hydrocortisone 1 % ointment    Sig: Apply 1 application topically 2 (two) times daily.    Dispense:  30 g    Refill:  0  . ketoconazole (NIZORAL) 2 % cream    Sig: Apply 1 application topically daily.    Dispense:  15 g    Refill:  0    Time spent: 25 Minutes  This patient was seen by Orson Gear AGNP-C in Collaboration with Dr Lavera Guise as a part  of collaborative care agreement.  Kendell Bane AGNP-C Internal Medicine

## 2018-11-11 ENCOUNTER — Encounter: Payer: Self-pay | Admitting: Adult Health

## 2018-12-13 ENCOUNTER — Encounter: Payer: BC Managed Care – PPO | Admitting: Obstetrics and Gynecology

## 2018-12-26 ENCOUNTER — Other Ambulatory Visit: Payer: Self-pay

## 2018-12-26 MED ORDER — SERTRALINE HCL 50 MG PO TABS: 50.0000 mg | ORAL_TABLET | Freq: Every day | ORAL | 0 refills | Status: DC

## 2019-01-04 ENCOUNTER — Encounter: Payer: BC Managed Care – PPO | Admitting: Obstetrics and Gynecology

## 2019-01-09 ENCOUNTER — Other Ambulatory Visit: Payer: Self-pay | Admitting: Nurse Practitioner

## 2019-01-09 MED ORDER — GABAPENTIN 600 MG PO TABS: ORAL_TABLET | ORAL | 1 refills | Status: DC

## 2019-02-14 ENCOUNTER — Encounter: Payer: Self-pay | Admitting: Nurse Practitioner

## 2019-02-14 ENCOUNTER — Ambulatory Visit: Payer: BC Managed Care – PPO | Admitting: Nurse Practitioner

## 2019-02-14 ENCOUNTER — Other Ambulatory Visit: Payer: Self-pay

## 2019-02-14 VITALS — Ht 64.0 in | Wt 190.0 lb

## 2019-02-14 DIAGNOSIS — K581 Irritable bowel syndrome with constipation: Secondary | ICD-10-CM

## 2019-02-14 DIAGNOSIS — F321 Major depressive disorder, single episode, moderate: Secondary | ICD-10-CM

## 2019-02-14 DIAGNOSIS — E039 Hypothyroidism, unspecified: Secondary | ICD-10-CM | POA: Diagnosis not present

## 2019-02-14 NOTE — Progress Notes (Signed)
[facility] [place], [place] [address]  Internal MEDICINE  Telephone Visit  Patient Name: Sherry Evans  [id number]  [id number]  Date of Service: 02/14/2019  I connected with the patient at 3:10pm by telephone and verified the patients identity using two identifiers.   I discussed the limitations, risks, security and privacy concerns of performing an evaluation and management service by telephone and the availability of in person appointments. I also discussed with the patient that there may be a patient responsible charge related to the service.  The patient expressed understanding and agrees to proceed.    Chief Complaint  Patient presents with  . Telephone Screen    PHONE VISIT [phone]  . Telephone Assessment  . Medical Management of Chronic Issues    6 month follow up  . Hypothyroidism  . Gastroesophageal Reflux    The patient has been contacted via telephone for follow up visit due to concerns for spread of novel coronavirus. She has no physical concerns or complaints today. She had routine, fasting labs done 07/2018 and all were within normal limits. She is due to have her well woman exma with GYN 03/01/2019 and they will order mammogram for her.       Current Medication: Outpatient Encounter Medications as of 02/14/2019  Medication Sig  . AMITIZA 24 MCG capsule TAKE ONE CAPSULE BY MOUTH TWICE A DAY  . azelastine (ASTELIN) 0.1 % nasal spray Place 2 sprays into both nostrils 2 (two) times daily as needed for allergies. Use in each nostril as directed  . cetirizine (ZYRTEC) 10 MG tablet Take 10 mg by mouth daily.  . Cholecalciferol (VITAMIN D3 SUPER STRENGTH) 2000 units TABS Take 2,000 Units by mouth daily.  Marland Kitchen conjugated estrogens (PREMARIN) vaginal cream Place 7.10 Applicatorfuls vaginally 2 (two) times a week.  . gabapentin (NEURONTIN) 600 MG tablet TAKE 2 TABLETS BY MOUTH AT EVENING MEAL  . hydrocortisone 1 % ointment Apply 1 application  topically 2 (two) times daily.  Marland Kitchen ketoconazole (NIZORAL) 2 % cream Apply 1 application topically daily.  Marland Kitchen levothyroxine (SYNTHROID, LEVOTHROID) 100 MCG tablet TAKE 1 TABLET BY MOUTH DAILY  . montelukast (SINGULAIR) 10 MG tablet TAKE 1 TABLET BY MOUTH DAILY FOR ALLERGIES  . Multiple Vitamins-Calcium (VIACTIV MULTI-VITAMIN) CHEW Chew 1 tablet by mouth daily.  Marland Kitchen nystatin-triamcinolone ointment (MYCOLOG) Apply 1 application topically 2 (two) times daily as needed.  . Probiotic Product (PROBIOTIC-10 PO) Take by mouth.  . ranitidine (ZANTAC) 150 MG tablet Take 150 mg by mouth 2 (two) times daily.  . sertraline (ZOLOFT) 50 MG tablet Take 1 tablet (50 mg total) by mouth daily.  . traZODone (DESYREL) 50 MG tablet TAKE 1 TABLET BY MOUTH AT BEDTIME AS NEEDED   No facility-administered encounter medications on file as of 02/14/2019.     Surgical History: Past Surgical History:  Procedure Laterality Date  . adnoids removed  1969  . BACK SURGERY  2008  . BICEPT TENODESIS Right 04/02/2016   Procedure: BICEPS TENODESIS;  Surgeon: Corky Mull, MD;  Location: [facility] ORS;  Service: Orthopedics;  Laterality: Right;  . BLADDER SUSPENSION  2009  . BREAST BIOPSY Left 1990's   benign  . BREAST BIOPSY Left October 28, 2015   SMALL FRAGMENTS OF LYMPHOID TISSUE  . CHOLECYSTECTOMY    . COLONOSCOPY    . DILATION AND CURETTAGE OF UTERUS    . GASTRIC RESTRICTION SURGERY  08/23/15  . HERNIA REPAIR  2016   hiatal  . JOINT  REPLACEMENT Bilateral 2014   Partial Knee Replacement,  Surgeon: Dr. Little Ishikawa, Brooke Bonito. [place] SURGERY  2014  . SHOULDER ARTHROSCOPY WITH DEBRIDEMENT AND BICEP TENDON REPAIR Right 04/02/2016   Procedure: SHOULDER ARTHROSCOPY WITH DEBRIDEMENT ;  Surgeon: Corky Mull, MD;  Location: [facility] ORS;  Service: Orthopedics;  Laterality: Right;  . SHOULDER ARTHROSCOPY WITH OPEN ROTATOR CUFF REPAIR Right 04/02/2016   Procedure: SHOULDER ARTHROSCOPY WITH OPEN ROTATOR CUFF REPAIR;  Surgeon: Corky Mull, MD;  Location: [facility] ORS;  Service: Orthopedics;  Laterality: Right;  . SHOULDER ARTHROSCOPY WITH SUBACROMIAL DECOMPRESSION Right 04/02/2016   Procedure: SHOULDER ARTHROSCOPY WITH SUBACROMIAL DECOMPRESSION;  Surgeon: Corky Mull, MD;  Location: [facility] ORS;  Service: Orthopedics;  Laterality: Right;    Medical History: Past Medical History:  Diagnosis Date  . Anxiety   . Depression   . GERD (gastroesophageal reflux disease)   . History of sinus surgery    3 times  . Hypothyroidism   . Sleep apnea    use C-PAP  . Trigger finger     Family History: Family History  Problem Relation Age of Onset  . Hypertension Father   . Diabetes Father   . Breast cancer Maternal Aunt        great aunts. 70-80  . Breast cancer Maternal Grandmother 80       metatastic to breast  . Ovarian cancer Maternal Grandmother   . Breast cancer Paternal Grandmother        71's  . Colon cancer Neg Hx     Social History   Socioeconomic History  . Marital status: Married    Spouse name: Not on file  . Number of children: Not on file  . Years of education: Not on file  . Highest education level: Not on file  Occupational History  . Not on file  Social Needs  . Financial resource strain: Not on file  . Food insecurity:    Worry: Not on file    Inability: Not on file  . Transportation needs:    Medical: Not on file    Non-medical: Not on file  Tobacco Use  . Smoking status: Never Smoker  . Smokeless tobacco: Never Used  Substance and Sexual Activity  . Alcohol use: No    Alcohol/week: 0.0 standard drinks  . Drug use: No  . Sexual activity: Yes    Birth control/protection: Post-menopausal  Lifestyle  . Physical activity:    Days per week: 0 days    Minutes per session: 0 min  . Stress: Not on file  Relationships  . Social connections:    Talks on phone: Not on file    Gets together: Not on file    Attends religious service: Not on file    Active member of club or organization: Not  on file    Attends meetings of clubs or organizations: Not on file    Relationship status: Not on file  . Intimate partner violence:    Fear of current or ex partner: Not on file    Emotionally abused: Not on file    Physically abused: Not on file    Forced sexual activity: Not on file  Other Topics Concern  . Not on file  Social History Narrative  . Not on file      Review of Systems  Constitutional: Negative for chills, fatigue and unexpected weight change.  HENT: Negative for congestion, postnasal drip, rhinorrhea, sneezing and sore throat.  Respiratory: Negative for cough, chest tightness, shortness of breath and wheezing.   Cardiovascular: Negative for chest pain and palpitations.  Gastrointestinal: Negative for abdominal pain, constipation, diarrhea, nausea and vomiting.  Endocrine: Negative for cold intolerance, heat intolerance, polydipsia, polyphagia and polyuria.       Most recent thyroid panel was within normal limits.   Genitourinary: Negative for dysuria and frequency.  Musculoskeletal: Negative for arthralgias, back pain, joint swelling, myalgias and neck pain.  Skin: Negative for rash.  Allergic/Immunologic: Negative for environmental allergies.  Neurological: Negative for dizziness, tremors, weakness, numbness and headaches.  Hematological: Negative for adenopathy. Does not bruise/bleed easily.  Psychiatric/Behavioral: Positive for dysphoric mood. Negative for behavioral problems (Depression), sleep disturbance and suicidal ideas. The patient is nervous/anxious.        Well controlled on her current medications.     Today's Vitals   02/14/19 1425  Weight: 190 lb (86.2 kg)  Height: 5\' 4"  (1.626 m)   Body mass index is 32.61 kg/m.   Observation/Objective:   The patient is alert and oriented. She is pleasant and answers all questions appropriately. Breathing is non-labored. She is in no acute distress at this time.    Assessment/Plan:  1. Acquired  hypothyroidism Recent thyroid panel normal. Continue levothyroxine at current dose.   2. Irritable bowel syndrome with constipation Stable. Continue amitiza as prescribed   3. Depression, major, single episode, moderate (HCC) Stable. Continue all medication as prescribed   General Counseling: Aliyah verbalizes understanding of the findings of today's phone visit and agrees with plan of treatment. I have discussed any further diagnostic evaluation that may be needed or ordered today. We also reviewed her medications today. she has been encouraged to call the office with any questions or concerns that should arise related to todays visit.  This patient was seen by Leretha Pol FNP Collaboration with Dr Lavera Guise as a part of collaborative care agreement  Time spent: 29 Minutes    Dr Lavera Guise Internal medicine

## 2019-03-01 ENCOUNTER — Encounter: Payer: BC Managed Care – PPO | Admitting: Obstetrics and Gynecology

## 2019-03-06 ENCOUNTER — Telehealth: Payer: Self-pay

## 2019-03-06 NOTE — Telephone Encounter (Signed)
Pt prescreened no symptoms pt has face mask.   Coronavirus (COVID-19) Are you at risk?  Are you at risk for the Coronavirus (COVID-19)?  To be considered HIGH RISK for Coronavirus (COVID-19), you have to meet the following criteria:  . Traveled to [place], [place], [place], [place] or [place]; or in the [place] to [place], [place], [place], or [place]; and have fever, cough, and shortness of breath within the last 2 weeks of travel OR . Been in close contact with a person diagnosed with COVID-19 within the last 2 weeks and have fever, cough, and shortness of breath . IF YOU DO NOT MEET THESE CRITERIA, YOU ARE CONSIDERED LOW RISK FOR COVID-19.  What to do if you are HIGH RISK for COVID-19?  . If you are having a medical emergency, call 911. . Seek medical care right away. Before you go to a doctor's office, urgent care or emergency department, call ahead and tell them about your recent travel, contact with someone diagnosed with COVID-19, and your symptoms. You should receive instructions from your physician's office regarding next steps of care.  . When you arrive at healthcare provider, tell the healthcare staff immediately you have returned from visiting [place], [place], [place], [place] or [place]; or traveled in the [place] to [place], [place], [place], or [place]; in the last two weeks or you have been in close contact with a person diagnosed with COVID-19 in the last 2 weeks.   . Tell the health care staff about your symptoms: fever, cough and shortness of breath. . After you have been seen by a medical provider, you will be either: o Tested for (COVID-19) and discharged home on quarantine except to seek medical care if symptoms worsen, and asked to  - Stay home and avoid contact with others until you get your results (4-5 days)  - Avoid travel on public transportation if possible (such as bus, train, or airplane) or o Sent to the Emergency Department by EMS for  evaluation, COVID-19 testing, and possible admission depending on your condition and test results.  What to do if you are LOW RISK for COVID-19?  Reduce your risk of any infection by using the same precautions used for avoiding the common cold or flu:  . Wash your hands often with soap and warm water for at least 20 seconds.  If soap and water are not readily available, use an alcohol-based hand sanitizer with at least 60% alcohol.  . If coughing or sneezing, cover your mouth and nose by coughing or sneezing into the elbow areas of your shirt or coat, into a tissue or into your sleeve (not your hands). . Avoid shaking hands with others and consider head nods or verbal greetings only. . Avoid touching your eyes, nose, or mouth with unwashed hands.  . Avoid close contact with people who are sick. . Avoid places or events with large numbers of people in one location, like concerts or sporting events. . Carefully consider travel plans you have or are making. . If you are planning any travel outside or inside the [place], visit the CDC's Travelers' Health webpage for the latest health notices. . If you have some symptoms but not all symptoms, continue to monitor at home and seek medical attention if your symptoms worsen. . If you are having a medical emergency, call 911.   ADDITIONAL HEALTHCARE OPTIONS FOR PATIENTS  [facility] / e-Visit: [email or web address]           [facility]: [phone]  [facility]: [phone]                   [facility]: [phone]  

## 2019-03-07 ENCOUNTER — Ambulatory Visit (INDEPENDENT_AMBULATORY_CARE_PROVIDER_SITE_OTHER): Payer: BC Managed Care – PPO | Admitting: Obstetrics and Gynecology

## 2019-03-07 ENCOUNTER — Other Ambulatory Visit: Payer: Self-pay

## 2019-03-07 ENCOUNTER — Encounter: Payer: Self-pay | Admitting: Obstetrics and Gynecology

## 2019-03-07 VITALS — BP 112/66 | HR 89 | Ht 64.0 in | Wt 198.2 lb

## 2019-03-07 DIAGNOSIS — Z01419 Encounter for gynecological examination (general) (routine) without abnormal findings: Secondary | ICD-10-CM | POA: Diagnosis not present

## 2019-03-07 DIAGNOSIS — E669 Obesity, unspecified: Secondary | ICD-10-CM

## 2019-03-07 DIAGNOSIS — Z1239 Encounter for other screening for malignant neoplasm of breast: Secondary | ICD-10-CM

## 2019-03-07 NOTE — Progress Notes (Signed)
HPI:      [name] is a 58 y.o. [id number] who LMP was Patient's last menstrual period was 01/03/2016.  Subjective:   She presents today for her annual examination.  She has no complaints.  She states she has occasional hot flashes but she does not want to consider medication for this.  She has some vaginal dryness but uses lubrication for intercourse.    Hx: The following portions of the patient's history were reviewed and updated as appropriate:             She  has a past medical history of Anxiety, Depression, GERD (gastroesophageal reflux disease), History of sinus surgery, Hypothyroidism, Sleep apnea, and Trigger finger. She does not have any pertinent problems on file. She  has a past surgical history that includes Bladder suspension (2009); Back surgery (2008); Knee surgery (2014); Cholecystectomy; Gastric restriction surgery (08/23/15); Hernia repair (2016); adnoids removed (1969); Colonoscopy; Dilation and curettage of uterus; Joint replacement (Bilateral, 2014); Shoulder arthroscopy with open rotator cuff repair (Right, 04/02/2016); Bicept tenodesis (Right, 04/02/2016); Shoulder arthroscopy with subacromial decompression (Right, 04/02/2016); Shoulder arthroscopy with debridement and bicep tendon repair (Right, 04/02/2016); Breast biopsy (Left, 1990's); and Breast biopsy (Left, October 28, 2015). Her family history includes Breast cancer in her maternal aunt and paternal grandmother; Breast cancer (age of onset: 52) in her maternal grandmother; Diabetes in her father; Hypertension in her father; Ovarian cancer in her maternal grandmother. She  reports that she has never smoked. She has never used smokeless tobacco. She reports that she does not drink alcohol or use drugs. She has a current medication list which includes the following prescription(s): amitiza, azelastine, cetirizine, cholecalciferol, conjugated estrogens, gabapentin, hydrocortisone, levothyroxine, montelukast, viactiv  multi-vitamin, probiotic product, ranitidine, sertraline, trazodone, ketoconazole, and nystatin-triamcinolone ointment. She is allergic to levonorgestrel-ethinyl estrad; azithromycin; and sulfa antibiotics.       Review of Systems:  Review of Systems  Constitutional: Denied constitutional symptoms, night sweats, recent illness, fatigue, fever, insomnia and weight loss.  Eyes: Denied eye symptoms, eye pain, photophobia, vision change and visual disturbance.  Ears/Nose/Throat/Neck: Denied ear, nose, throat or neck symptoms, hearing loss, nasal discharge, sinus congestion and sore throat.  Cardiovascular: Denied cardiovascular symptoms, arrhythmia, chest pain/pressure, edema, exercise intolerance, orthopnea and palpitations.  Respiratory: Denied pulmonary symptoms, asthma, pleuritic pain, productive sputum, cough, dyspnea and wheezing.  Gastrointestinal: Denied, gastro-esophageal reflux, melena, nausea and vomiting.  Genitourinary: Denied genitourinary symptoms including symptomatic vaginal discharge, pelvic relaxation issues, and urinary complaints.  Musculoskeletal: Denied musculoskeletal symptoms, stiffness, swelling, muscle weakness and myalgia.  Dermatologic: Denied dermatology symptoms, rash and scar.  Neurologic: Denied neurology symptoms, dizziness, headache, neck pain and syncope.  Psychiatric: Denied psychiatric symptoms, anxiety and depression.  Endocrine: Denied endocrine symptoms including hot flashes and night sweats.   Meds:   Current Outpatient Medications on File Prior to Visit  Medication Sig Dispense Refill  . AMITIZA 24 MCG capsule TAKE ONE CAPSULE BY MOUTH TWICE A DAY 180 capsule 3  . azelastine (ASTELIN) 0.1 % nasal spray Place 2 sprays into both nostrils 2 (two) times daily as needed for allergies. Use in each nostril as directed    . cetirizine (ZYRTEC) 10 MG tablet Take 10 mg by mouth daily.    . Cholecalciferol (VITAMIN D3 SUPER STRENGTH) 2000 units TABS Take 2,000  Units by mouth daily.    Marland Kitchen conjugated estrogens (PREMARIN) vaginal cream Place 6.29 Applicatorfuls vaginally 2 (two) times a week. 42.5 g 12  . gabapentin (NEURONTIN) 600 MG  tablet TAKE 2 TABLETS BY MOUTH AT EVENING MEAL 180 tablet 1  . hydrocortisone 1 % ointment Apply 1 application topically 2 (two) times daily. 30 g 0  . levothyroxine (SYNTHROID, LEVOTHROID) 100 MCG tablet TAKE 1 TABLET BY MOUTH DAILY 90 tablet 2  . montelukast (SINGULAIR) 10 MG tablet TAKE 1 TABLET BY MOUTH DAILY FOR ALLERGIES 90 tablet 3  . Multiple Vitamins-Calcium (VIACTIV MULTI-VITAMIN) CHEW Chew 1 tablet by mouth daily.    . Probiotic Product (PROBIOTIC-10 PO) Take by mouth.    . ranitidine (ZANTAC) 150 MG tablet Take 150 mg by mouth 2 (two) times daily.    . sertraline (ZOLOFT) 50 MG tablet Take 1 tablet (50 mg total) by mouth daily. 90 tablet 0  . traZODone (DESYREL) 50 MG tablet TAKE 1 TABLET BY MOUTH AT BEDTIME AS NEEDED 90 tablet 3  . ketoconazole (NIZORAL) 2 % cream Apply 1 application topically daily. 15 g 0  . nystatin-triamcinolone ointment (MYCOLOG) Apply 1 application topically 2 (two) times daily as needed. (Patient not taking: Reported on 03/07/2019) 30 g 2   No current facility-administered medications on file prior to visit.     Objective:     Vitals:   03/07/19 1028  BP: 112/66  Pulse: 89              Physical examination General NAD, Conversant  HEENT Atraumatic; Op clear with mmm.  Normo-cephalic. Pupils reactive. Anicteric sclerae  Thyroid/Neck Smooth without nodularity or enlargement. Normal ROM.  Neck Supple.  Skin No rashes, lesions or ulceration. Normal palpated skin turgor. No nodularity.  Breasts: No masses or discharge.  Symmetric.  No axillary adenopathy.  Lungs: Clear to auscultation.No rales or wheezes. Normal Respiratory effort, no retractions.  Heart: NSR.  No murmurs or rubs appreciated. No periferal edema  Abdomen: Soft.  Non-tender.  No masses.  No HSM. No hernia   Extremities: Moves all appropriately.  Normal ROM for age. No lymphadenopathy.  Neuro: Oriented to PPT.  Normal mood. Normal affect.     Pelvic:   Vulva: Normal appearance.  No lesions.  Vagina: No lesions or abnormalities noted.  Support: Normal pelvic support.  Urethra No masses tenderness or scarring.  Meatus Normal size without lesions or prolapse.  Cervix: Normal appearance.  No lesions.  Anus: Normal exam.  No lesions.  Perineum: Normal exam.  No lesions.        Bimanual   Uterus: Normal size.  Non-tender.  Mobile.  AV.  Adnexae: No masses.  Non-tender to palpation.  Cul-de-sac: Negative for abnormality.      Assessment:    [id number] Patient Active Problem List   Diagnosis Date Noted  . Encounter for general adult medical examination with abnormal findings 08/20/2018  . Irritable bowel syndrome with constipation 08/20/2018  . Depression, major, single episode, moderate ([place]) 08/20/2018  . Chickenpox 08/16/2018  . Environmental allergies 08/16/2018  . Menstrual abnormality 08/16/2018  . Pain in left hip 02/23/2018  . Fatigue 02/23/2018  . Raynaud's phenomenon without gangrene 02/23/2018  . Hypothyroidism 10/29/2016  . Vaginal atrophy 10/29/2016  . Dyspareunia in female 10/29/2016  . Obesity (BMI 30.0-34.9) 10/29/2016  . Menopause 10/29/2016  . Tendinitis of right rotator cuff 03/02/2016  . Injury of tendon of long head of right biceps 03/02/2016  . 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  . 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 Obstructive sleep apnea syndrome 11/27/2015  . Morbid obesity ([place]) 08/21/2015  . Lumbar radiculitis 08/12/2015  .  Shortness of breath 07/29/2015  . Abnormal ECG 07/15/2015  . Anxiety disorder 06/12/2015  . Multiple joint pain 06/12/2015  . DDD (degenerative disc disease), lumbar 06/19/2014  . Knee contusion 02/27/2014     1. Screening for breast cancer   2. Well woman  exam with routine gynecological exam   3. Obesity (BMI 30.0-34.9)     Patient without complaint   Plan:            1.  Basic Screening Recommendations The basic screening recommendations for asymptomatic women were discussed with the patient during her visit.  The age-appropriate recommendations were discussed with her and the rational for the tests reviewed.  When I am informed by the patient that another primary care physician has previously obtained the age-appropriate tests and they are up-to-date, only outstanding tests are ordered and referrals given as necessary.  Abnormal results of tests will be discussed with her when all of her results are completed. Patient not due for Pap at this time-mammogram ordered. Orders Orders Placed This Encounter  Procedures  . MM DIGITAL SCREENING BILATERAL    No orders of the defined types were placed in this encounter.       F/U  Return in about 1 year (around 03/06/2020) for Annual Physical.  Finis Bud, M.D. 03/07/2019 10:43 AM

## 2019-03-07 NOTE — Progress Notes (Signed)
Patient comes in today for her annual exam. She has no concerns today.

## 2019-03-30 ENCOUNTER — Other Ambulatory Visit: Payer: Self-pay

## 2019-03-30 MED ORDER — SERTRALINE HCL 50 MG PO TABS: 50.0000 mg | ORAL_TABLET | Freq: Every day | ORAL | 0 refills | Status: DC

## 2019-04-24 ENCOUNTER — Other Ambulatory Visit: Payer: Self-pay

## 2019-04-24 ENCOUNTER — Ambulatory Visit
Admission: RE | Admit: 2019-04-24 | Discharge: 2019-04-24 | Disposition: A | Discharge status: Home / Self Care | Payer: BC Managed Care – PPO | Source: Ambulatory Visit | Attending: Obstetrics and Gynecology | Admitting: Obstetrics and Gynecology

## 2019-04-24 DIAGNOSIS — Z1239 Encounter for other screening for malignant neoplasm of breast: Secondary | ICD-10-CM

## 2019-04-24 DIAGNOSIS — Z1231 Encounter for screening mammogram for malignant neoplasm of breast: Secondary | ICD-10-CM | POA: Insufficient documentation

## 2019-06-05 ENCOUNTER — Other Ambulatory Visit: Payer: Self-pay

## 2019-06-05 MED ORDER — LEVOTHYROXINE SODIUM 100 MCG PO TABS: 100.0000 ug | ORAL_TABLET | Freq: Every day | ORAL | 2 refills | Status: DC

## 2019-06-29 ENCOUNTER — Other Ambulatory Visit: Payer: Self-pay

## 2019-06-29 MED ORDER — SERTRALINE HCL 50 MG PO TABS: 50.0000 mg | ORAL_TABLET | Freq: Every day | ORAL | 0 refills | Status: DC

## 2019-07-03 ENCOUNTER — Other Ambulatory Visit: Payer: Self-pay

## 2019-07-03 MED ORDER — LUBIPROSTONE 24 MCG PO CAPS: 24.0000 ug | ORAL_CAPSULE | Freq: Two times a day (BID) | ORAL | 1 refills | Status: DC

## 2019-07-03 MED ORDER — TRAZODONE HCL 50 MG PO TABS: 50.0000 mg | ORAL_TABLET | Freq: Every evening | ORAL | 1 refills | Status: DC | PRN

## 2019-07-10 ENCOUNTER — Other Ambulatory Visit: Payer: Self-pay | Admitting: Nurse Practitioner

## 2019-07-10 MED ORDER — MONTELUKAST SODIUM 10 MG PO TABS: ORAL_TABLET | ORAL | 3 refills | Status: DC

## 2019-07-10 MED ORDER — GABAPENTIN 600 MG PO TABS: ORAL_TABLET | ORAL | 1 refills | Status: DC

## 2019-08-16 ENCOUNTER — Telehealth: Payer: Self-pay

## 2019-08-16 NOTE — Telephone Encounter (Signed)
CONFIRMED AND SCREENED FOR 08-22-19 OV. °

## 2019-08-21 ENCOUNTER — Telehealth: Payer: Self-pay

## 2019-08-21 NOTE — Telephone Encounter (Signed)
CONFIRMED 08-22-19 OV AS VIRTUAL.

## 2019-08-22 ENCOUNTER — Ambulatory Visit: Payer: BC Managed Care – PPO | Admitting: Nurse Practitioner

## 2019-08-22 DIAGNOSIS — K581 Irritable bowel syndrome with constipation: Secondary | ICD-10-CM | POA: Diagnosis not present

## 2019-08-22 DIAGNOSIS — E039 Hypothyroidism, unspecified: Secondary | ICD-10-CM | POA: Diagnosis not present

## 2019-08-22 DIAGNOSIS — F321 Major depressive disorder, single episode, moderate: Secondary | ICD-10-CM

## 2019-08-22 DIAGNOSIS — R635 Abnormal weight gain: Secondary | ICD-10-CM

## 2019-08-23 ENCOUNTER — Encounter: Payer: Self-pay | Admitting: Nurse Practitioner

## 2019-08-23 DIAGNOSIS — R635 Abnormal weight gain: Secondary | ICD-10-CM | POA: Insufficient documentation

## 2019-08-23 NOTE — Progress Notes (Signed)
[facility] [place], [place] [address]  Internal MEDICINE  Telephone Visit  Patient Name: Sherry Evans  [id number]  NH:[id number]  Date of Service: 08/23/2019  I connected with the patient at 3:10pm by telephone and verified the patients identity using two identifiers.   I discussed the limitations, risks, security and privacy concerns of performing an evaluation and management service by telephone and the availability of in person appointments. I also discussed with the patient that there may be a patient responsible charge related to the service.  The patient expressed understanding and agrees to proceed.    Chief Complaint  Patient presents with  . Telephone Assessment  . Telephone Screen  . Annual Exam  . Anxiety  . Depression    The patient has been contacted via telephone for follow up visit due to concerns for spread of novel coronavirus. The patients presents today for annual wellness visit. She states that she has been having trouble with a trigger finger abnormality in regard to the thumb of her left hand. She is seeing orthopedic provider. Has received several injections into the affected joint. Helps for a few months but then symptoms seem to return. Has next appointment next week. She is concerned about weight gain since onset of pandemic. States that she has already contacted her bariatric surgeon so they can help her get back on track with her weight loss journey. She is due to have routine, fasting labs. Unsure if labs will be drawn when she sees bariatric surgeon. She states that she is, otherwise, doing well. Has no concerns or complaints.       Current Medication: Outpatient Encounter Medications as of 08/22/2019  Medication Sig  . azelastine (ASTELIN) 0.1 % nasal spray Place 2 sprays into both nostrils 2 (two) times daily as needed for allergies. Use in each nostril as directed  . cetirizine (ZYRTEC) 10 MG tablet Take 10 mg by mouth daily.   . Cholecalciferol (VITAMIN D3 SUPER STRENGTH) 2000 units TABS Take 2,000 Units by mouth daily.  Marland Kitchen conjugated estrogens (PREMARIN) vaginal cream Place [id number] Applicatorfuls vaginally 2 (two) times a week.  . gabapentin (NEURONTIN) 600 MG tablet TAKE 2 TABLETS BY MOUTH AT EVENING MEAL  . hydrocortisone 1 % ointment Apply 1 application topically 2 (two) times daily.  Marland Kitchen ketoconazole (NIZORAL) 2 % cream Apply 1 application topically daily.  Marland Kitchen levothyroxine (SYNTHROID) 100 MCG tablet Take 1 tablet (100 mcg total) by mouth daily.  Marland Kitchen lubiprostone (AMITIZA) 24 MCG capsule Take 1 capsule (24 mcg total) by mouth 2 (two) times daily.  . montelukast (SINGULAIR) 10 MG tablet TAKE 1 TABLET BY MOUTH DAILY FOR ALLERGIES  . Multiple Vitamins-Calcium (VIACTIV MULTI-VITAMIN) CHEW Chew 1 tablet by mouth daily.  Marland Kitchen nystatin-triamcinolone ointment (MYCOLOG) Apply 1 application topically 2 (two) times daily as needed.  . Probiotic Product (PROBIOTIC-10 PO) Take by mouth.  . ranitidine (ZANTAC) 150 MG tablet Take 150 mg by mouth 2 (two) times daily.  . sertraline (ZOLOFT) 50 MG tablet Take 1 tablet (50 mg total) by mouth daily.  . traZODone (DESYREL) 50 MG tablet Take 1 tablet (50 mg total) by mouth at bedtime as needed.   No facility-administered encounter medications on file as of 08/22/2019.     Surgical History: Past Surgical History:  Procedure Laterality Date  . adnoids removed  1969  . BACK SURGERY  2008  . BICEPT TENODESIS Right 04/02/2016   Procedure: BICEPS TENODESIS;  Surgeon: Corky Mull, MD;  Location: [facility] ORS;  Service: Orthopedics;  Laterality: Right;  . BLADDER SUSPENSION  2009  . BREAST BIOPSY Left 1990's   benign  . BREAST BIOPSY Left October 28, 2015   SMALL FRAGMENTS OF LYMPHOID TISSUE  . CHOLECYSTECTOMY    . COLONOSCOPY    . DILATION AND CURETTAGE OF UTERUS    . GASTRIC RESTRICTION SURGERY  08/23/15  . HERNIA REPAIR  2016   hiatal  . JOINT REPLACEMENT Bilateral 2014   Partial Knee  Replacement,  Surgeon: Dr. Little Ishikawa, Brooke Bonito. [place] SURGERY  2014  . SHOULDER ARTHROSCOPY WITH DEBRIDEMENT AND BICEP TENDON REPAIR Right 04/02/2016   Procedure: SHOULDER ARTHROSCOPY WITH DEBRIDEMENT ;  Surgeon: Corky Mull, MD;  Location: [facility] ORS;  Service: Orthopedics;  Laterality: Right;  . SHOULDER ARTHROSCOPY WITH OPEN ROTATOR CUFF REPAIR Right 04/02/2016   Procedure: SHOULDER ARTHROSCOPY WITH OPEN ROTATOR CUFF REPAIR;  Surgeon: Corky Mull, MD;  Location: [facility] ORS;  Service: Orthopedics;  Laterality: Right;  . SHOULDER ARTHROSCOPY WITH SUBACROMIAL DECOMPRESSION Right 04/02/2016   Procedure: SHOULDER ARTHROSCOPY WITH SUBACROMIAL DECOMPRESSION;  Surgeon: Corky Mull, MD;  Location: [facility] ORS;  Service: Orthopedics;  Laterality: Right;    Medical History: Past Medical History:  Diagnosis Date  . Anxiety   . Depression   . GERD (gastroesophageal reflux disease)   . History of sinus surgery    3 times  . Hypothyroidism   . Sleep apnea    use C-PAP  . Trigger finger     Family History: Family History  Problem Relation Age of Onset  . Hypertension Father   . Diabetes Father   . Breast cancer Maternal Aunt        great aunts. 70-80  . Breast cancer Maternal Grandmother 60       metatastic to breast  . Ovarian cancer Maternal Grandmother   . Breast cancer Paternal Grandmother        25's  . Colon cancer Neg Hx     Social History   Socioeconomic History  . Marital status: Married    Spouse name: Not on file  . Number of children: Not on file  . Years of education: Not on file  . Highest education level: Not on file  Occupational History  . Not on file  Social Needs  . Financial resource strain: Not on file  . Food insecurity    Worry: Not on file    Inability: Not on file  . Transportation needs    Medical: Not on file    Non-medical: Not on file  Tobacco Use  . Smoking status: Never Smoker  . Smokeless tobacco: Never Used  Substance and Sexual  Activity  . Alcohol use: No    Alcohol/week: 0.0 standard drinks  . Drug use: No  . Sexual activity: Yes    Birth control/protection: Post-menopausal  Lifestyle  . Physical activity    Days per week: 0 days    Minutes per session: 0 min  . Stress: Not on file  Relationships  . Social Herbalist on phone: Not on file    Gets together: Not on file    Attends religious service: Not on file    Active member of club or organization: Not on file    Attends meetings of clubs or organizations: Not on file    Relationship status: Not on file  . Intimate partner violence    Fear of current or ex partner:  Not on file    Emotionally abused: Not on file    Physically abused: Not on file    Forced sexual activity: Not on file  Other Topics Concern  . Not on file  Social History Narrative  . Not on file      Review of Systems  Constitutional: Negative for activity change, chills, fatigue and unexpected weight change.       Reports recent weight gain since onset of pandemic.  HENT: Negative for congestion, postnasal drip, rhinorrhea, sneezing and sore throat.   Respiratory: Negative for cough, chest tightness and shortness of breath.   Cardiovascular: Negative for chest pain and palpitations.  Gastrointestinal: Negative for abdominal pain, constipation, diarrhea, nausea and vomiting.  Endocrine: Negative for cold intolerance, heat intolerance, polydipsia and polyuria.  Musculoskeletal: Negative for arthralgias, back pain, joint swelling and neck pain.  Skin: Negative for rash.  Allergic/Immunologic: Negative for environmental allergies.  Neurological: Negative for dizziness, tremors, numbness and headaches.  Hematological: Negative for adenopathy. Does not bruise/bleed easily.  Psychiatric/Behavioral: Negative for behavioral problems (Depression), sleep disturbance and suicidal ideas. The patient is not nervous/anxious.     Vital Signs: LMP 01/03/2016     Observation/Objective:   The patient is alert and oriented. She is pleasant and answers all questions appropriately. Breathing is non-labored. She is in no acute distress at this time.    Assessment/Plan:  1. Acquired hypothyroidism Stable. Will check thyroid panel and adjust levothyroxine as indicated.   2. Irritable bowel syndrome with constipation Continue amitiza as prescribed   3. Depression, major, single episode, moderate ([facility]) Stable.   4. Abnormal weight gain Follow up visit as scheduled with bariatric surgeon.   General Counseling: Lylla verbalizes understanding of the findings of today's phone visit and agrees with plan of treatment. I have discussed any further diagnostic evaluation that may be needed or ordered today. We also reviewed her medications today. she has been encouraged to call the office with any questions or concerns that should arise related to todays visit.   This patient was seen by Leretha Pol FNP Collaboration with Dr Lavera Guise as a part of collaborative care agreement  Time spent: 25 Minutes    Dr Lavera Guise Internal medicine

## 2019-09-18 ENCOUNTER — Other Ambulatory Visit: Payer: Self-pay | Admitting: Nurse Practitioner

## 2019-09-18 ENCOUNTER — Telehealth: Payer: Self-pay

## 2019-09-18 DIAGNOSIS — K581 Irritable bowel syndrome with constipation: Secondary | ICD-10-CM

## 2019-09-18 MED ORDER — LINACLOTIDE 145 MCG PO CAPS: 145.0000 ug | ORAL_CAPSULE | Freq: Every day | ORAL | 3 refills | Status: DC

## 2019-09-18 NOTE — Telephone Encounter (Signed)
Discontinued amitiza. Start linzess 144mcg daily. New prescription sent to her pharmacy.

## 2019-09-18 NOTE — Telephone Encounter (Signed)
Send message 

## 2019-09-18 NOTE — Telephone Encounter (Signed)
Can we find out if her insurance covers linzess or trulance? I will change it to one of those if they cover one.

## 2019-09-18 NOTE — Telephone Encounter (Signed)
Pt advised we send linzess to phar

## 2019-09-18 NOTE — Progress Notes (Signed)
Discontinued amitiza. Start linzess 153mcg daily. New prescription sent to her pharmacy.

## 2019-09-18 NOTE — Telephone Encounter (Signed)
Movantik and linzess and smyproic

## 2019-09-20 DIAGNOSIS — Z9884 Bariatric surgery status: Secondary | ICD-10-CM | POA: Insufficient documentation

## 2019-10-02 ENCOUNTER — Other Ambulatory Visit: Payer: Self-pay

## 2019-10-02 MED ORDER — SERTRALINE HCL 50 MG PO TABS: 50.0000 mg | ORAL_TABLET | Freq: Every day | ORAL | 0 refills | Status: DC

## 2019-10-18 DIAGNOSIS — K219 Gastro-esophageal reflux disease without esophagitis: Secondary | ICD-10-CM | POA: Insufficient documentation

## 2019-12-11 ENCOUNTER — Other Ambulatory Visit: Payer: Self-pay

## 2019-12-11 MED ORDER — SERTRALINE HCL 50 MG PO TABS: 50.0000 mg | ORAL_TABLET | Freq: Every day | ORAL | 0 refills | Status: DC

## 2019-12-11 MED ORDER — TRAZODONE HCL 50 MG PO TABS: 50.0000 mg | ORAL_TABLET | Freq: Every evening | ORAL | 0 refills | Status: DC | PRN

## 2019-12-21 ENCOUNTER — Other Ambulatory Visit: Payer: Self-pay

## 2019-12-21 MED ORDER — GABAPENTIN 600 MG PO TABS: ORAL_TABLET | ORAL | 0 refills | Status: DC

## 2019-12-25 ENCOUNTER — Telehealth: Payer: Self-pay

## 2019-12-25 NOTE — Telephone Encounter (Signed)
LMOM TO CONFIRM AND SCREEN FOR 12-27-19 OV.

## 2019-12-27 ENCOUNTER — Ambulatory Visit (INDEPENDENT_AMBULATORY_CARE_PROVIDER_SITE_OTHER): Payer: BC Managed Care – PPO | Admitting: Adult Health

## 2019-12-27 ENCOUNTER — Other Ambulatory Visit: Payer: Self-pay

## 2019-12-27 ENCOUNTER — Encounter: Payer: Self-pay | Admitting: Adult Health

## 2019-12-27 VITALS — BP 125/76 | HR 91 | Temp 97.4°F | Resp 16 | Ht 64.0 in | Wt 198.2 lb

## 2019-12-27 DIAGNOSIS — M2550 Pain in unspecified joint: Secondary | ICD-10-CM | POA: Diagnosis not present

## 2019-12-27 DIAGNOSIS — M1990 Unspecified osteoarthritis, unspecified site: Secondary | ICD-10-CM

## 2019-12-27 NOTE — Progress Notes (Signed)
[facility] [place], [place] [address]  Internal MEDICINE  Office Visit Note  Patient Name: Sherry Evans  [id number]  [id number]  Date of Service: 12/27/2019  Chief Complaint  Patient presents with  . Acute Visit    knots on fingers on right hand, achy in hands knees and hips     HPI Pt is here for a sick visit.  Patient reports in the past few months she noticed some knots that have come up on there finger joints.  She has three on her right hand.  She has "flare ups" of pain that are intermittent.  She has osteoarthritis and is currently on meloxicam for this.      Current Medication:  Outpatient Encounter Medications as of 12/27/2019  Medication Sig  . cetirizine (ZYRTEC) 10 MG tablet Take 10 mg by mouth daily.  . Cholecalciferol (VITAMIN D3 SUPER STRENGTH) 2000 units TABS Take 2,000 Units by mouth daily.  Marland Kitchen gabapentin (NEURONTIN) 600 MG tablet TAKE 2 TABLETS BY MOUTH AT EVENING MEAL  . hydrocortisone 1 % ointment Apply 1 application topically 2 (two) times daily.  Marland Kitchen ketoconazole (NIZORAL) 2 % cream Apply 1 application topically daily.  Marland Kitchen lactulose (CHRONULAC) 10 GM/15ML solution Take 15 mLs by mouth in the morning and at bedtime.  Marland Kitchen levothyroxine (SYNTHROID) 100 MCG tablet Take 1 tablet (100 mcg total) by mouth daily.  Marland Kitchen linaclotide (LINZESS) 145 MCG CAPS capsule Take 1 capsule (145 mcg total) by mouth daily before breakfast.  . meloxicam (MOBIC) 15 MG tablet Take 15 mg by mouth 1 day or 1 dose.  . montelukast (SINGULAIR) 10 MG tablet TAKE 1 TABLET BY MOUTH DAILY FOR ALLERGIES  . Multiple Vitamins-Calcium (VIACTIV MULTI-VITAMIN) CHEW Chew 1 tablet by mouth daily.  Marland Kitchen nystatin-triamcinolone ointment (MYCOLOG) Apply 1 application topically 2 (two) times daily as needed.  . Probiotic Product (PROBIOTIC-10 PO) Take by mouth.  . senna-docusate (SENOKOT-S) 8.6-50 MG tablet Take 8.6-50 tablets by mouth 1 day or 1 dose.  . sertraline (ZOLOFT) 50 MG tablet  Take 1 tablet (50 mg total) by mouth daily.  . traZODone (DESYREL) 50 MG tablet Take 1 tablet (50 mg total) by mouth at bedtime as needed.  Marland Kitchen azelastine (ASTELIN) 0.1 % nasal spray Place 2 sprays into both nostrils 2 (two) times daily as needed for allergies. Use in each nostril as directed  . conjugated estrogens (PREMARIN) vaginal cream Place 7.56 Applicatorfuls vaginally 2 (two) times a week. (Patient not taking: Reported on 12/27/2019)  . ranitidine (ZANTAC) 150 MG tablet Take 150 mg by mouth 2 (two) times daily.   No facility-administered encounter medications on file as of 12/27/2019.      Medical History: Past Medical History:  Diagnosis Date  . Anxiety   . Depression   . GERD (gastroesophageal reflux disease)   . History of sinus surgery    3 times  . Hypothyroidism   . Sleep apnea    use C-PAP  . Trigger finger      Vital Signs: BP 125/76   Pulse 91   Temp (!) 97.4 F (36.3 C)   Resp 16   Ht '5\' 4"'  (1.626 m)   Wt 198 lb 3.2 oz (89.9 kg)   LMP 01/03/2016   SpO2 98%   BMI 34.02 kg/m    Review of Systems  Constitutional: Negative for chills, fatigue and unexpected weight change.  HENT: Negative for congestion, rhinorrhea, sneezing and sore throat.   Eyes: Negative for photophobia,  pain and redness.  Respiratory: Negative for cough, chest tightness and shortness of breath.   Cardiovascular: Negative for chest pain and palpitations.  Gastrointestinal: Negative for abdominal pain, constipation, diarrhea, nausea and vomiting.  Endocrine: Negative.   Genitourinary: Negative for dysuria and frequency.  Musculoskeletal: Negative for arthralgias, back pain, joint swelling and neck pain.  Skin: Negative for rash.  Allergic/Immunologic: Negative.   Neurological: Negative for tremors and numbness.  Hematological: Negative for adenopathy. Does not bruise/bleed easily.  Psychiatric/Behavioral: Negative for behavioral problems and sleep disturbance. The patient is not  nervous/anxious.     Physical Exam Vitals and nursing note reviewed.  Constitutional:      General: She is not in acute distress.    Appearance: She is well-developed. She is not diaphoretic.  HENT:     Head: Normocephalic and atraumatic.     Mouth/Throat:     Pharynx: No oropharyngeal exudate.  Eyes:     Pupils: Pupils are equal, round, and reactive to light.  Neck:     Thyroid: No thyromegaly.     Vascular: No JVD.     Trachea: No tracheal deviation.  Cardiovascular:     Rate and Rhythm: Normal rate and regular rhythm.     Heart sounds: Normal heart sounds. No murmur. No friction rub. No gallop.   Pulmonary:     Effort: Pulmonary effort is normal. No respiratory distress.     Breath sounds: Normal breath sounds. No wheezing or rales.  Chest:     Chest wall: No tenderness.  Abdominal:     Palpations: Abdomen is soft.     Tenderness: There is no abdominal tenderness. There is no guarding.  Musculoskeletal:        General: Normal range of motion.     Cervical back: Normal range of motion and neck supple.  Lymphadenopathy:     Cervical: No cervical adenopathy.  Skin:    General: Skin is warm and dry.  Neurological:     Mental Status: She is alert and oriented to person, place, and time.     Cranial Nerves: No cranial nerve deficit.  Psychiatric:        Behavior: Behavior normal.        Thought Content: Thought content normal.        Judgment: Judgment normal.    Assessment/Plan: 1. Arthralgia, unspecified joint Have labs done and follow up in office.  - ANA Direct w/Reflex if Positive - Rheumatoid factor - Sed Rate (ESR)  2. Other type of osteoarthritis, unspecified site Encouraged patient to start taking meloxicam as prescribed.  General Counseling: Sherry Evans verbalizes understanding of the findings of todays visit and agrees with plan of treatment. I have discussed any further diagnostic evaluation that may be needed or ordered today. We also reviewed her  medications today. she has been encouraged to call the office with any questions or concerns that should arise related to todays visit.   Orders Placed This Encounter  Procedures  . ANA Direct w/Reflex if Positive  . Rheumatoid factor  . Sed Rate (ESR)    No orders of the defined types were placed in this encounter.   Time spent: 30 Minutes  This patient was seen by Orson Gear AGNP-C in Collaboration with Dr Lavera Guise as a part of collaborative care agreement.  Kendell Bane AGNP-C Internal Medicine

## 2019-12-28 LAB — SEDIMENTATION RATE: Sed Rate: 14 mm/hr (ref 0–40)

## 2019-12-28 LAB — RHEUMATOID FACTOR: Rheumatoid fact SerPl-aCnc: <10 IU/mL (ref 0.0–13.9)

## 2019-12-28 LAB — ANA W/REFLEX IF POSITIVE: Anti Nuclear Antibody (ANA): NEGATIVE

## 2020-01-11 ENCOUNTER — Telehealth: Payer: Self-pay

## 2020-01-11 NOTE — Telephone Encounter (Signed)
Confirmed appointment on 01/15/2020 and screened for covid. klh 

## 2020-01-15 ENCOUNTER — Other Ambulatory Visit: Payer: Self-pay

## 2020-01-15 ENCOUNTER — Ambulatory Visit (INDEPENDENT_AMBULATORY_CARE_PROVIDER_SITE_OTHER): Payer: BC Managed Care – PPO | Admitting: Adult Health

## 2020-01-15 ENCOUNTER — Encounter: Payer: Self-pay | Admitting: Adult Health

## 2020-01-15 VITALS — BP 121/75 | HR 81 | Temp 97.9°F | Resp 16 | Ht 64.0 in | Wt 197.8 lb

## 2020-01-15 DIAGNOSIS — F321 Major depressive disorder, single episode, moderate: Secondary | ICD-10-CM | POA: Diagnosis not present

## 2020-01-15 DIAGNOSIS — E039 Hypothyroidism, unspecified: Secondary | ICD-10-CM

## 2020-01-15 DIAGNOSIS — M2550 Pain in unspecified joint: Secondary | ICD-10-CM | POA: Diagnosis not present

## 2020-01-15 NOTE — Progress Notes (Signed)
[facility] [place], [place] [address]  Internal MEDICINE  Office Visit Note  Patient Name: Sherry Evans  [id number]  [id number]  Date of Service: 01/15/2020  Chief Complaint  Patient presents with  . Follow-up  . Depression  . Gastroesophageal Reflux    HPI  Pt is here for follow up on depression, GERD, and labs that were drawn.  She had an ANA, FR factor and sed rate that were ordered for some joint nodules.  Her labs were all WNL. She has a history of osteoarthritis.  She had bariatric weight loss surgery in 2016, and a sleep study in 2019 shows she no longer needed cpap.  She has maintained her weight, however since covid she gained 15 pounds.  She has lost around 10 of these pounds through the help of the bariatric surgeon. Her labs were update din December 2020 by them also, seen in care everywhere.       Current Medication: Outpatient Encounter Medications as of 01/15/2020  Medication Sig  . azelastine (ASTELIN) 0.1 % nasal spray Place 2 sprays into both nostrils 2 (two) times daily as needed for allergies. Use in each nostril as directed  . cetirizine (ZYRTEC) 10 MG tablet Take 10 mg by mouth daily.  . Cholecalciferol (VITAMIN D3 SUPER STRENGTH) 2000 units TABS Take 2,000 Units by mouth daily.  Marland Kitchen gabapentin (NEURONTIN) 600 MG tablet TAKE 2 TABLETS BY MOUTH AT EVENING MEAL  . hydrocortisone 1 % ointment Apply 1 application topically 2 (two) times daily.  Marland Kitchen ketoconazole (NIZORAL) 2 % cream Apply 1 application topically daily.  Marland Kitchen lactulose (CHRONULAC) 10 GM/15ML solution Take 15 mLs by mouth in the morning and at bedtime.  Marland Kitchen levothyroxine (SYNTHROID) 100 MCG tablet Take 1 tablet (100 mcg total) by mouth daily.  Marland Kitchen linaclotide (LINZESS) 145 MCG CAPS capsule Take 1 capsule (145 mcg total) by mouth daily before breakfast.  . meloxicam (MOBIC) 15 MG tablet Take 15 mg by mouth 1 day or 1 dose.  . montelukast (SINGULAIR) 10 MG tablet TAKE 1 TABLET BY MOUTH  DAILY FOR ALLERGIES  . Multiple Vitamins-Calcium (VIACTIV MULTI-VITAMIN) CHEW Chew 1 tablet by mouth daily.  Marland Kitchen nystatin-triamcinolone ointment (MYCOLOG) Apply 1 application topically 2 (two) times daily as needed.  . Probiotic Product (PROBIOTIC-10 PO) Take by mouth.  . ranitidine (ZANTAC) 150 MG tablet Take 150 mg by mouth 2 (two) times daily.  Marland Kitchen senna-docusate (SENOKOT-S) 8.6-50 MG tablet Take 8.6-50 tablets by mouth 1 day or 1 dose.  . sertraline (ZOLOFT) 50 MG tablet Take 1 tablet (50 mg total) by mouth daily.  . traZODone (DESYREL) 50 MG tablet Take 1 tablet (50 mg total) by mouth at bedtime as needed.  . [DISCONTINUED] conjugated estrogens (PREMARIN) vaginal cream Place [id number] Applicatorfuls vaginally 2 (two) times a week. (Patient not taking: Reported on 12/27/2019)   No facility-administered encounter medications on file as of 01/15/2020.    Surgical History: Past Surgical History:  Procedure Laterality Date  . adnoids removed  1969  . BACK SURGERY  2008  . BICEPT TENODESIS Right 04/02/2016   Procedure: BICEPS TENODESIS;  Surgeon: Corky Mull, MD;  Location: [facility] ORS;  Service: Orthopedics;  Laterality: Right;  . BLADDER SUSPENSION  2009  . BREAST BIOPSY Left 1990's   benign  . BREAST BIOPSY Left October 28, 2015   SMALL FRAGMENTS OF LYMPHOID TISSUE  . CHOLECYSTECTOMY    . COLONOSCOPY    . DILATION AND CURETTAGE OF UTERUS    .  GASTRIC RESTRICTION SURGERY  08/23/15  . HERNIA REPAIR  2016   hiatal  . JOINT REPLACEMENT Bilateral 2014   Partial Knee Replacement,  Surgeon: Dr. Little Ishikawa, Brooke Bonito. [place] [facility]  2014  . SHOULDER ARTHROSCOPY WITH DEBRIDEMENT AND BICEP TENDON REPAIR Right 04/02/2016   Procedure: SHOULDER ARTHROSCOPY WITH DEBRIDEMENT ;  Surgeon: Corky Mull, MD;  Location: [facility] ORS;  Service: Orthopedics;  Laterality: Right;  . SHOULDER ARTHROSCOPY WITH OPEN ROTATOR CUFF REPAIR Right 04/02/2016   Procedure: SHOULDER ARTHROSCOPY WITH OPEN ROTATOR CUFF REPAIR;   Surgeon: Corky Mull, MD;  Location: [facility] ORS;  Service: Orthopedics;  Laterality: Right;  . SHOULDER ARTHROSCOPY WITH SUBACROMIAL DECOMPRESSION Right 04/02/2016   Procedure: SHOULDER ARTHROSCOPY WITH SUBACROMIAL DECOMPRESSION;  Surgeon: Corky Mull, MD;  Location: [facility] ORS;  Service: Orthopedics;  Laterality: Right;    Medical History: Past Medical History:  Diagnosis Date  . Anxiety   . Depression   . GERD (gastroesophageal reflux disease)   . History of sinus surgery    3 times  . Hypothyroidism   . Sleep apnea    use C-PAP  . Trigger finger     Family History: Family History  Problem Relation Age of Onset  . Hypertension Father   . Diabetes Father   . Breast cancer Maternal Aunt        great aunts. 70-80  . Breast cancer Maternal Grandmother 47       metatastic to breast  . Ovarian cancer Maternal Grandmother   . Breast cancer Paternal Grandmother        34's  . Colon cancer Neg Hx     Social History   Socioeconomic History  . Marital status: Married    Spouse name: Not on file  . Number of children: Not on file  . Years of education: Not on file  . Highest education level: Not on file  Occupational History  . Not on file  Tobacco Use  . Smoking status: Never Smoker  . Smokeless tobacco: Never Used  Substance and Sexual Activity  . Alcohol use: No    Alcohol/week: 0.0 standard drinks  . Drug use: No  . Sexual activity: Yes    Birth control/protection: Post-menopausal  Other Topics Concern  . Not on file  Social History Narrative  . Not on file   Social Determinants of Health   Financial Resource Strain:   . Difficulty of Paying Living Expenses:   Food Insecurity:   . Worried About Charity fundraiser in the Last Year:   . Arboriculturist in the Last Year:   Transportation Needs:   . Film/video editor (Medical):   Marland Kitchen Lack of Transportation (Non-Medical):   Physical Activity:   . Days of Exercise per Week:   . Minutes of Exercise per  Session:   Stress:   . Feeling of Stress :   Social Connections:   . Frequency of Communication with Friends and Family:   . Frequency of Social Gatherings with Friends and Family:   . Attends Religious Services:   . Active Member of Clubs or Organizations:   . Attends Archivist Meetings:   Marland Kitchen Marital Status:   Intimate Partner Violence:   . Fear of Current or Ex-Partner:   . Emotionally Abused:   Marland Kitchen Physically Abused:   . Sexually Abused:       Review of Systems  Constitutional: Negative for chills, fatigue and unexpected weight change.  HENT: Negative for congestion, rhinorrhea, sneezing and sore throat.   Eyes: Negative for photophobia, pain and redness.  Respiratory: Negative for cough, chest tightness and shortness of breath.   Cardiovascular: Negative for chest pain and palpitations.  Gastrointestinal: Negative for abdominal pain, constipation, diarrhea, nausea and vomiting.  Endocrine: Negative.   Genitourinary: Negative for dysuria and frequency.  Musculoskeletal: Negative for arthralgias, back pain, joint swelling and neck pain.  Skin: Negative for rash.  Allergic/Immunologic: Negative.   Neurological: Negative for tremors and numbness.  Hematological: Negative for adenopathy. Does not bruise/bleed easily.  Psychiatric/Behavioral: Negative for behavioral problems and sleep disturbance. The patient is not nervous/anxious.     Vital Signs: BP 121/75   Pulse 81   Temp 97.9 F (36.6 C)   Resp 16   Ht 5\' 4"  (1.626 m)   Wt 197 lb 12.8 oz (89.7 kg)   LMP 01/03/2016   SpO2 99%   BMI 33.95 kg/m    Physical Exam Vitals and nursing note reviewed.  Constitutional:      General: She is not in acute distress.    Appearance: She is well-developed. She is not diaphoretic.  HENT:     Head: Normocephalic and atraumatic.     Mouth/Throat:     Pharynx: No oropharyngeal exudate.  Eyes:     Pupils: Pupils are equal, round, and reactive to light.  Neck:      Thyroid: No thyromegaly.     Vascular: No JVD.     Trachea: No tracheal deviation.  Cardiovascular:     Rate and Rhythm: Normal rate and regular rhythm.     Heart sounds: Normal heart sounds. No murmur. No friction rub. No gallop.   Pulmonary:     Effort: Pulmonary effort is normal. No respiratory distress.     Breath sounds: Normal breath sounds. No wheezing or rales.  Chest:     Chest wall: No tenderness.  Abdominal:     Palpations: Abdomen is soft.     Tenderness: There is no abdominal tenderness. There is no guarding.  Musculoskeletal:        General: Normal range of motion.     Cervical back: Normal range of motion and neck supple.  Lymphadenopathy:     Cervical: No cervical adenopathy.  Skin:    General: Skin is warm and dry.  Neurological:     Mental Status: She is alert and oriented to person, place, and time.     Cranial Nerves: No cranial nerve deficit.  Psychiatric:        Behavior: Behavior normal.        Thought Content: Thought content normal.        Judgment: Judgment normal.    Assessment/Plan: 1. Arthralgia, unspecified joint Continue with diet and weight loss plan.    2. Acquired hypothyroidism Continue synthroid as prescribed.   3. Depression, major, single episode, moderate (HCC) Appears at baseline, continue to monitor, and continue current medications.   General Counseling: Ma verbalizes understanding of the findings of todays visit and agrees with plan of treatment. I have discussed any further diagnostic evaluation that may be needed or ordered today. We also reviewed her medications today. she has been encouraged to call the office with any questions or concerns that should arise related to todays visit.    No orders of the defined types were placed in this encounter.   No orders of the defined types were placed in this encounter.   Time spent: 30 Minutes  This patient was seen by Orson Gear AGNP-C in Collaboration with Dr Lavera Guise as a part of collaborative care agreement     Kendell Bane AGNP-C Internal medicine

## 2020-02-20 ENCOUNTER — Telehealth: Payer: Self-pay

## 2020-02-20 NOTE — Telephone Encounter (Signed)
Confirmed and screened for 02-22-20 ov.

## 2020-02-22 ENCOUNTER — Encounter: Payer: Self-pay | Admitting: Nurse Practitioner

## 2020-02-22 ENCOUNTER — Other Ambulatory Visit: Payer: Self-pay

## 2020-02-22 ENCOUNTER — Ambulatory Visit (INDEPENDENT_AMBULATORY_CARE_PROVIDER_SITE_OTHER): Payer: BC Managed Care – PPO | Admitting: Nurse Practitioner

## 2020-02-22 VITALS — BP 115/72 | HR 86 | Temp 97.3°F | Resp 16 | Ht 64.0 in | Wt 197.6 lb

## 2020-02-22 DIAGNOSIS — E039 Hypothyroidism, unspecified: Secondary | ICD-10-CM | POA: Diagnosis not present

## 2020-02-22 DIAGNOSIS — I499 Cardiac arrhythmia, unspecified: Secondary | ICD-10-CM

## 2020-02-22 DIAGNOSIS — F321 Major depressive disorder, single episode, moderate: Secondary | ICD-10-CM

## 2020-02-22 DIAGNOSIS — R300 Dysuria: Secondary | ICD-10-CM

## 2020-02-22 DIAGNOSIS — R319 Hematuria, unspecified: Secondary | ICD-10-CM

## 2020-02-22 DIAGNOSIS — N390 Urinary tract infection, site not specified: Secondary | ICD-10-CM | POA: Diagnosis not present

## 2020-02-22 LAB — POCT URINALYSIS DIPSTICK
Bilirubin, UA: NEGATIVE
Glucose, UA: NEGATIVE
Ketones, UA: NEGATIVE
Nitrite, UA: POSITIVE
Protein, UA: POSITIVE — AB
Spec Grav, UA: >=1.03 — AB (ref 1.010–1.025)
Urobilinogen, UA: 0.2 E.U./dL
pH, UA: 5 (ref 5.0–8.0)

## 2020-02-22 MED ORDER — PHENAZOPYRIDINE HCL 200 MG PO TABS: 200.0000 mg | ORAL_TABLET | Freq: Three times a day (TID) | ORAL | 0 refills | Status: DC | PRN

## 2020-02-22 MED ORDER — SERTRALINE HCL 50 MG PO TABS: 50.0000 mg | ORAL_TABLET | Freq: Every day | ORAL | 0 refills | Status: DC

## 2020-02-22 MED ORDER — LEVOTHYROXINE SODIUM 100 MCG PO TABS: 100.0000 ug | ORAL_TABLET | Freq: Every day | ORAL | 2 refills | Status: DC

## 2020-02-22 MED ORDER — TRAZODONE HCL 50 MG PO TABS: 50.0000 mg | ORAL_TABLET | Freq: Every evening | ORAL | 0 refills | Status: DC | PRN

## 2020-02-22 MED ORDER — NITROFURANTOIN MONOHYD MACRO 100 MG PO CAPS: 100.0000 mg | ORAL_CAPSULE | Freq: Two times a day (BID) | ORAL | 0 refills | Status: DC

## 2020-02-22 NOTE — Progress Notes (Signed)
[facility] [place], [place] [address]  Internal MEDICINE  Office Visit Note  Patient Name: Sherry Evans  [id number]  NH:[id number]  Date of Service: 02/28/2020  Chief Complaint  Patient presents with  . Follow-up  . Hypothyroidism  . Urinary Tract Infection    feels discomfort when urinating     The patient is here for routine follow up. She is complaining of discomfort when urinating. She feels like she has to urinate more frequently and sometimes isn't urinating a whole lot when she does use the bathroom. She states that she did have some lower bally pain yesterday. Has also had some lower back pain which is mild. Denies nausea or vomiting. She states that symptoms have been going on for about two weeks. Has been drinking cranberry juice. Not helping so much but not hurting so much.  She has noted some nodules on the joints of her fingers. Is taking some meloxicam due to joint pain she had been having in her knees. This was prescribed per orthopedics. She was sent to have some labs done to check connective tissue panel. Results of these tests were all negative. She did have routine, fasting labs done in 08/2019 and all were normal.       Current Medication: Outpatient Encounter Medications as of 02/22/2020  Medication Sig  . azelastine (ASTELIN) 0.1 % nasal spray Place 2 sprays into both nostrils 2 (two) times daily as needed for allergies. Use in each nostril as directed  . cetirizine (ZYRTEC) 10 MG tablet Take 10 mg by mouth daily.  . Cholecalciferol (VITAMIN D3 SUPER STRENGTH) 2000 units TABS Take 2,000 Units by mouth daily.  Marland Kitchen gabapentin (NEURONTIN) 600 MG tablet TAKE 2 TABLETS BY MOUTH AT EVENING MEAL  . hydrocortisone 1 % ointment Apply 1 application topically 2 (two) times daily.  Marland Kitchen ketoconazole (NIZORAL) 2 % cream Apply 1 application topically daily.  Marland Kitchen lactulose (CHRONULAC) 10 GM/15ML solution Take 15 mLs by mouth in the morning and at bedtime.  Marland Kitchen  levothyroxine (SYNTHROID) 100 MCG tablet Take 1 tablet (100 mcg total) by mouth daily.  Marland Kitchen linaclotide (LINZESS) 145 MCG CAPS capsule Take 1 capsule (145 mcg total) by mouth daily before breakfast.  . montelukast (SINGULAIR) 10 MG tablet TAKE 1 TABLET BY MOUTH DAILY FOR ALLERGIES  . Multiple Vitamins-Calcium (VIACTIV MULTI-VITAMIN) CHEW Chew 1 tablet by mouth daily.  Marland Kitchen nystatin-triamcinolone ointment (MYCOLOG) Apply 1 application topically 2 (two) times daily as needed.  . Probiotic Product (PROBIOTIC-10 PO) Take by mouth.  . ranitidine (ZANTAC) 150 MG tablet Take 150 mg by mouth 2 (two) times daily.  Marland Kitchen senna-docusate (SENOKOT-S) 8.6-50 MG tablet Take 8.6-50 tablets by mouth 1 day or 1 dose.  . sertraline (ZOLOFT) 50 MG tablet Take 1 tablet (50 mg total) by mouth daily.  . traZODone (DESYREL) 50 MG tablet Take 1 tablet (50 mg total) by mouth at bedtime as needed.  . [DISCONTINUED] levothyroxine (SYNTHROID) 100 MCG tablet Take 1 tablet (100 mcg total) by mouth daily.  . [DISCONTINUED] sertraline (ZOLOFT) 50 MG tablet Take 1 tablet (50 mg total) by mouth daily.  . [DISCONTINUED] traZODone (DESYREL) 50 MG tablet Take 1 tablet (50 mg total) by mouth at bedtime as needed.  . nitrofurantoin, macrocrystal-monohydrate, (MACROBID) 100 MG capsule Take 1 capsule (100 mg total) by mouth 2 (two) times daily.  . phenazopyridine (PYRIDIUM) 200 MG tablet Take 1 tablet (200 mg total) by mouth 3 (three) times daily as needed for pain.  . [  DISCONTINUED] conjugated estrogens (PREMARIN) vaginal cream Place [id number] Applicatorfuls vaginally 2 (two) times a week. (Patient not taking: Reported on 12/27/2019)  . [DISCONTINUED] gabapentin (NEURONTIN) 600 MG tablet TAKE 2 TABLETS BY MOUTH AT EVENING MEAL  . [DISCONTINUED] lubiprostone (AMITIZA) 24 MCG capsule Take 1 capsule (24 mcg total) by mouth 2 (two) times daily.  . [DISCONTINUED] sertraline (ZOLOFT) 50 MG tablet Take 1 tablet (50 mg total) by mouth daily.  . [DISCONTINUED]  traZODone (DESYREL) 50 MG tablet Take 1 tablet (50 mg total) by mouth at bedtime as needed.   No facility-administered encounter medications on file as of 02/22/2020.    Surgical History: Past Surgical History:  Procedure Laterality Date  . adnoids removed  1969  . BACK SURGERY  2008  . BICEPT TENODESIS Right 04/02/2016   Procedure: BICEPS TENODESIS;  Surgeon: Corky Mull, MD;  Location: [facility] ORS;  Service: Orthopedics;  Laterality: Right;  . BLADDER SUSPENSION  2009  . BREAST BIOPSY Left 1990's   benign  . BREAST BIOPSY Left October 28, 2015   SMALL FRAGMENTS OF LYMPHOID TISSUE  . CHOLECYSTECTOMY    . COLONOSCOPY    . DILATION AND CURETTAGE OF UTERUS    . GASTRIC RESTRICTION SURGERY  08/23/15  . HERNIA REPAIR  2016   hiatal  . JOINT REPLACEMENT Bilateral 2014   Partial Knee Replacement,  Surgeon: Dr. Little Ishikawa, Brooke Bonito. Tony SURGERY  2014  . SHOULDER ARTHROSCOPY WITH DEBRIDEMENT AND BICEP TENDON REPAIR Right 04/02/2016   Procedure: SHOULDER ARTHROSCOPY WITH DEBRIDEMENT ;  Surgeon: Corky Mull, MD;  Location: [facility] ORS;  Service: Orthopedics;  Laterality: Right;  . SHOULDER ARTHROSCOPY WITH OPEN ROTATOR CUFF REPAIR Right 04/02/2016   Procedure: SHOULDER ARTHROSCOPY WITH OPEN ROTATOR CUFF REPAIR;  Surgeon: Corky Mull, MD;  Location: [facility] OR[facility];  Service: Orthopedics;  Laterality: Right;  . SHOULDER ARTHROSCOPY WITH SUBACROMIAL DECOMPRESSION Right 04/02/2016   Procedure: SHOULDER ARTHROSCOPY WITH SUBACROMIAL DECOMPRESSION;  Surgeon: Corky Mull, MD;  Location: [facility] ORS;  Service: Orthopedics;  Laterality: Right;    Medical History: Past Medical History:  Diagnosis Date  . Anxiety   . Depression   . GERD (gastroesophageal reflux disease)   . History of sinus surgery    3 times  . Hypothyroidism   . Sleep apnea    use C-PAP  . Trigger finger     Family History: Family History  Problem Relation Age of Onset  . Hypertension Father   . Diabetes Father   . Breast  cancer Maternal Aunt        great aunts. 70-80  . Breast cancer Maternal Grandmother 81       metatastic to breast  . Ovarian cancer Maternal Grandmother   . Breast cancer Paternal Grandmother        13's  . Colon cancer Neg Hx     Social History   Socioeconomic History  . Marital status: Married    Spouse name: Not on file  . Number of children: Not on file  . Years of education: Not on file  . Highest education level: Not on file  Occupational History  . Not on file  Tobacco Use  . Smoking status: Never Smoker  . Smokeless tobacco: Never Used  Substance and Sexual Activity  . Alcohol use: No    Alcohol/week: 0.0 standard drinks  . Drug use: No  . Sexual activity: Yes    Birth control/protection: Post-menopausal  Other Topics Concern  .  Not on file  Social History Narrative  . Not on file   Social Determinants of Health   Financial Resource Strain:   . Difficulty of Paying Living Expenses:   Food Insecurity:   . Worried About Charity fundraiser in the Last Year:   . Arboriculturist in the Last Year:   Transportation Needs:   . Film/video editor (Medical):   Marland Kitchen Lack of Transportation (Non-Medical):   Physical Activity:   . Days of Exercise per Week:   . Minutes of Exercise per Session:   Stress:   . Feeling of Stress :   Social Connections:   . Frequency of Communication with Friends and Family:   . Frequency of Social Gatherings with Friends and Family:   . Attends Religious Services:   . Active Member of Clubs or Organizations:   . Attends Archivist Meetings:   Marland Kitchen Marital Status:   Intimate Partner Violence:   . Fear of Current or Ex-Partner:   . Emotionally Abused:   Marland Kitchen Physically Abused:   . Sexually Abused:       Review of Systems  Constitutional: Positive for fatigue. Negative for activity change, chills and unexpected weight change.  HENT: Negative for congestion, postnasal drip, rhinorrhea, sneezing and sore throat.    Respiratory: Negative for cough, chest tightness and shortness of breath.   Cardiovascular: Negative for chest pain and palpitations.  Gastrointestinal: Negative for abdominal pain, constipation, diarrhea, nausea and vomiting.  Endocrine: Negative for cold intolerance, heat intolerance, polydipsia and polyuria.  Genitourinary: Positive for dysuria, frequency, pelvic pain and urgency.  Musculoskeletal: Positive for arthralgias. Negative for back pain, joint swelling and neck pain.  Skin: Negative for rash.  Allergic/Immunologic: Negative for environmental allergies.  Neurological: Negative for dizziness, tremors, numbness and headaches.  Hematological: Negative for adenopathy. Does not bruise/bleed easily.  Psychiatric/Behavioral: Positive for sleep disturbance. Negative for behavioral problems (Depression) and suicidal ideas. The patient is not nervous/anxious.     Today's Vitals   02/22/20 1428  BP: 115/72  Pulse: 86  Resp: 16  Temp: (!) 97.3 F (36.3 C)  SpO2: 97%  Weight: 197 lb 9.6 oz (89.6 kg)  Height: 5\' 4"  (1.626 m)   Body mass index is 33.92 kg/m.   Physical Exam Vitals and nursing note reviewed.  Constitutional:      General: She is not in acute distress.    Appearance: Normal appearance. She is well-developed. She is not diaphoretic.  HENT:     Head: Normocephalic and atraumatic.     Nose: Nose normal.     Mouth/Throat:     Pharynx: No oropharyngeal exudate.  Eyes:     Pupils: Pupils are equal, round, and reactive to light.  Neck:     Thyroid: No thyromegaly.     Vascular: No carotid bruit or JVD.     Trachea: No tracheal deviation.  Cardiovascular:     Rate and Rhythm: Normal rate. Rhythm irregular.     Heart sounds: Normal heart sounds. No murmur. No friction rub. No gallop.      Comments: ECG showing sinus rhythm with multiple ventricular ectopic beats. Possible old infarct.  Pulmonary:     Effort: Pulmonary effort is normal. No respiratory distress.      Breath sounds: Normal breath sounds. No wheezing or rales.  Chest:     Chest wall: No tenderness.  Abdominal:     General: Bowel sounds are normal.     Palpations: Abdomen  is soft.  Genitourinary:    Comments: Urine sample positive for protein and nitrites. Positive for trace WBC.  Musculoskeletal:        General: Normal range of motion.     Cervical back: Normal range of motion and neck supple.     Comments: Joint tenderness and swelling, especially significant on the hands. There are nodular densities present in multiple joints of the feigners of both hands which are tender to palpate.   Lymphadenopathy:     Cervical: No cervical adenopathy.  Skin:    General: Skin is warm and dry.  Neurological:     Mental Status: She is alert and oriented to person, place, and time.     Cranial Nerves: No cranial nerve deficit.  Psychiatric:        Mood and Affect: Mood normal.        Behavior: Behavior normal.        Thought Content: Thought content normal.        Judgment: Judgment normal.   Assessment/Plan: 1. Urinary tract infection with hematuria, site unspecified Start macrobid 100mg  twice daily for next ten days. Send urine for culture and sensitivity and adjust antibiotics as indicated.  - CULTURE, URINE COMPREHENSIVE - nitrofurantoin, macrocrystal-monohydrate, (MACROBID) 100 MG capsule; Take 1 capsule (100 mg total) by mouth 2 (two) times daily.  Dispense: 20 capsule; Refill: 0  2. Dysuria May take pyridium 200mg  up to three times daily as needed for bladder pain and spasms.  - POCT Urinalysis Dipstick - phenazopyridine (PYRIDIUM) 200 MG tablet; Take 1 tablet (200 mg total) by mouth 3 (three) times daily as needed for pain.  Dispense: 10 tablet; Refill: 0  3. Acquired hypothyroidism Thyroid panel stable. Continue levothyroxine as prescribed.  - levothyroxine (SYNTHROID) 100 MCG tablet; Take 1 tablet (100 mcg total) by mouth daily.  Dispense: 90 tablet; Refill: 2  4. Cardiac  arrhythmia, unspecified cardiac arrhythmia type Irregular heart rhythm auscultated during physical exam. ECG showing sinus rhythm with frequent ectopic ventricular beats. Will get echocardiogram for further evaluation.  - EKG 12-Lead - ECHOCARDIOGRAM COMPLETE; Future  5. Depression, major, single episode, moderate (HCC) Stable. Continue setaline daily. May take trazodone at bedtime as needed for insomnia related to depression/anxiety.  - traZODone (DESYREL) 50 MG tablet; Take 1 tablet (50 mg total) by mouth at bedtime as needed.  Dispense: 90 tablet; Refill: 0 - sertraline (ZOLOFT) 50 MG tablet; Take 1 tablet (50 mg total) by mouth daily.  Dispense: 90 tablet; Refill: 0  General Counseling: Aramis verbalizes understanding of the findings of todays visit and agrees with plan of treatment. I have discussed any further diagnostic evaluation that may be needed or ordered today. We also reviewed her medications today. she has been encouraged to call the office with any questions or concerns that should arise related to todays visit.  This patient was seen by Leretha Pol FNP Collaboration with Dr Lavera Guise as a part of collaborative care agreement  Orders Placed This Encounter  Procedures  . CULTURE, URINE COMPREHENSIVE  . POCT Urinalysis Dipstick  . EKG 12-Lead  . ECHOCARDIOGRAM COMPLETE    Meds ordered this encounter  Medications  . traZODone (DESYREL) 50 MG tablet    Sig: Take 1 tablet (50 mg total) by mouth at bedtime as needed.    Dispense:  90 tablet    Refill:  0    Order Specific Question:   Supervising Provider    Answer:   Lavera Guise [id number]  .  sertraline (ZOLOFT) 50 MG tablet    Sig: Take 1 tablet (50 mg total) by mouth daily.    Dispense:  90 tablet    Refill:  0    Order Specific Question:   Supervising Provider    Answer:   Lavera Guise [id number]  . levothyroxine (SYNTHROID) 100 MCG tablet    Sig: Take 1 tablet (100 mcg total) by mouth daily.    Dispense:  90 tablet     Refill:  2    Order Specific Question:   Supervising Provider    Answer:   Lavera Guise [id number]  . phenazopyridine (PYRIDIUM) 200 MG tablet    Sig: Take 1 tablet (200 mg total) by mouth 3 (three) times daily as needed for pain.    Dispense:  10 tablet    Refill:  0    Order Specific Question:   Supervising Provider    Answer:   Lavera Guise [id number]  . nitrofurantoin, macrocrystal-monohydrate, (MACROBID) 100 MG capsule    Sig: Take 1 capsule (100 mg total) by mouth 2 (two) times daily.    Dispense:  20 capsule    Refill:  0    Order Specific Question:   Supervising Provider    Answer:   Lavera Guise [id number]    Total time spent: 62 Minutes Time spent includes review of chart, medications, test results, and follow up plan with the patient.      Dr Lavera Guise Internal medicine

## 2020-02-26 LAB — CULTURE, URINE COMPREHENSIVE

## 2020-02-27 ENCOUNTER — Other Ambulatory Visit: Payer: Self-pay | Admitting: Nurse Practitioner

## 2020-02-28 ENCOUNTER — Telehealth: Payer: Self-pay

## 2020-02-28 DIAGNOSIS — R300 Dysuria: Secondary | ICD-10-CM | POA: Insufficient documentation

## 2020-02-28 DIAGNOSIS — I499 Cardiac arrhythmia, unspecified: Secondary | ICD-10-CM | POA: Insufficient documentation

## 2020-02-28 DIAGNOSIS — N390 Urinary tract infection, site not specified: Secondary | ICD-10-CM | POA: Insufficient documentation

## 2020-02-28 LAB — PHOSPHORUS: Phosphorus: 3.4 mg/dL (ref 3.0–4.3)

## 2020-02-28 LAB — CBC
Hematocrit: 40.5 % (ref 34.0–46.6)
Hemoglobin: 13.2 g/dL (ref 11.1–15.9)
MCH: 31.8 pg (ref 26.6–33.0)
MCHC: 32.6 g/dL (ref 31.5–35.7)
MCV: 98 fL — ABNORMAL HIGH (ref 79–97)
Platelets: 228 10*3/uL (ref 150–450)
RBC: 4.15 x10E6/uL (ref 3.77–5.28)
RDW: 11.8 % (ref 11.7–15.4)
WBC: 6 10*3/uL (ref 3.4–10.8)

## 2020-02-28 LAB — MAGNESIUM: Magnesium: 2.3 mg/dL (ref 1.6–2.3)

## 2020-02-28 LAB — BASIC METABOLIC PANEL
BUN/Creatinine Ratio: 29 — ABNORMAL HIGH (ref 9–23)
BUN: 24 mg/dL (ref 6–24)
CO2: 24 mmol/L (ref 20–29)
Calcium: 9 mg/dL (ref 8.7–10.2)
Chloride: 107 mmol/L — ABNORMAL HIGH (ref 96–106)
Creatinine, Ser: 0.84 mg/dL (ref 0.57–1.00)
GFR calc Af Amer: 89 mL/min/{1.73_m2} (ref >59)
GFR calc non Af Amer: 77 mL/min/{1.73_m2} (ref >59)
Glucose: 91 mg/dL (ref 65–99)
Potassium: 5.1 mmol/L (ref 3.5–5.2)
Sodium: 139 mmol/L (ref 134–144)

## 2020-02-28 LAB — T4, FREE: Free T4: 1.33 ng/dL (ref 0.82–1.77)

## 2020-02-28 LAB — TSH: TSH: 1.28 u[IU]/mL (ref 0.450–4.500)

## 2020-02-28 NOTE — Progress Notes (Signed)
Labs good. Discuss at visit 03/12/2020

## 2020-02-28 NOTE — Progress Notes (Signed)
Patient started on macrobid at time of visit

## 2020-02-28 NOTE — Telephone Encounter (Signed)
Confirmed appointment on 03/01/2020 and screened for covid. [facility]

## 2020-03-01 ENCOUNTER — Other Ambulatory Visit: Payer: Self-pay

## 2020-03-01 ENCOUNTER — Ambulatory Visit: Payer: BC Managed Care – PPO

## 2020-03-01 DIAGNOSIS — I499 Cardiac arrhythmia, unspecified: Secondary | ICD-10-CM

## 2020-03-07 ENCOUNTER — Ambulatory Visit (INDEPENDENT_AMBULATORY_CARE_PROVIDER_SITE_OTHER): Payer: BC Managed Care – PPO | Admitting: Obstetrics and Gynecology

## 2020-03-07 ENCOUNTER — Encounter: Payer: Self-pay | Admitting: Obstetrics and Gynecology

## 2020-03-07 ENCOUNTER — Other Ambulatory Visit: Payer: Self-pay

## 2020-03-07 VITALS — BP 117/76 | HR 86 | Ht 64.0 in | Wt 198.2 lb

## 2020-03-07 DIAGNOSIS — Z1231 Encounter for screening mammogram for malignant neoplasm of breast: Secondary | ICD-10-CM | POA: Diagnosis not present

## 2020-03-07 DIAGNOSIS — Z01419 Encounter for gynecological examination (general) (routine) without abnormal findings: Secondary | ICD-10-CM | POA: Diagnosis not present

## 2020-03-07 NOTE — Progress Notes (Signed)
HPI:      [name] is a 59 y.o. [id number] who LMP was Patient's last menstrual period was 01/03/2016.  Subjective:   She presents today for her annual examination.  She has no complaints.  She uses lubrication for intercourse.  Has no desire for HRT at this time. 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  She is understandably looking forward to returning to the classroom. PCP does routine blood work.    Hx: The following portions of the patient's history were reviewed and updated as appropriate:             She  has a past medical history of Anxiety, Depression, GERD (gastroesophageal reflux disease), History of sinus surgery, Hypothyroidism, Sleep apnea, and Trigger finger. She does not have any pertinent problems on file. She  has a past surgical history that includes Bladder suspension (2009); Back surgery (2008); Knee surgery (2014); Cholecystectomy; Gastric restriction surgery (08/23/15); Hernia repair (2016); adnoids removed (1969); Colonoscopy; Dilation and curettage of uterus; Joint replacement (Bilateral, 2014); Shoulder arthroscopy with open rotator cuff repair (Right, 04/02/2016); Bicept tenodesis (Right, 04/02/2016); Shoulder arthroscopy with subacromial decompression (Right, 04/02/2016); Shoulder arthroscopy with debridement and bicep tendon repair (Right, 04/02/2016); Breast biopsy (Left, 1990's); and Breast biopsy (Left, October 28, 2015). Her family history includes Breast cancer in her maternal aunt and paternal grandmother; Breast cancer (age of onset: 55) in her maternal grandmother; Diabetes in her father; Hypertension in her father; Ovarian cancer in her maternal grandmother. She  reports that she has never smoked. She has never used smokeless tobacco. She reports that she does not drink alcohol and does not use drugs. She has a current medication list which includes the following prescription(s): azelastine, cetirizine, cholecalciferol,  gabapentin, hydrocortisone, ketoconazole, lactulose, levothyroxine, linaclotide, montelukast, viactiv multi-vitamin, nystatin-triamcinolone ointment, phenazopyridine, probiotic product, senna-docusate, sertraline, trazodone, nitrofurantoin (macrocrystal-monohydrate), and ranitidine. She is allergic to levonorgestrel-ethinyl estrad, azithromycin, and sulfa antibiotics.       Review of Systems:  Review of Systems  Constitutional: Denied constitutional symptoms, night sweats, recent illness, fatigue, fever, insomnia and weight loss.  Eyes: Denied eye symptoms, eye pain, photophobia, vision change and visual disturbance.  Ears/Nose/Throat/Neck: Denied ear, nose, throat or neck symptoms, hearing loss, nasal discharge, sinus congestion and sore throat.  Cardiovascular: Denied cardiovascular symptoms, arrhythmia, chest pain/pressure, edema, exercise intolerance, orthopnea and palpitations.  Respiratory: Denied pulmonary symptoms, asthma, pleuritic pain, productive sputum, cough, dyspnea and wheezing.  Gastrointestinal: Denied, gastro-esophageal reflux, melena, nausea and vomiting.  Genitourinary: Denied genitourinary symptoms including symptomatic vaginal discharge, pelvic relaxation issues, and urinary complaints.  Musculoskeletal: Denied musculoskeletal symptoms, stiffness, swelling, muscle weakness and myalgia.  Dermatologic: Denied dermatology symptoms, rash and scar.  Neurologic: Denied neurology symptoms, dizziness, headache, neck pain and syncope.  Psychiatric: Denied psychiatric symptoms, anxiety and depression.  Endocrine: Denied endocrine symptoms including hot flashes and night sweats.   Meds:   Current Outpatient Medications on File Prior to Visit  Medication Sig Dispense Refill  . azelastine (ASTELIN) 0.1 % nasal spray Place 2 sprays into both nostrils 2 (two) times daily as needed for allergies. Use in each nostril as directed    . cetirizine (ZYRTEC) 10 MG tablet Take 10 mg by mouth  daily.    . Cholecalciferol (VITAMIN D3 SUPER STRENGTH) 2000 units TABS Take 2,000 Units by mouth daily.    Marland Kitchen gabapentin (NEURONTIN) 600 MG tablet TAKE 2 TABLETS BY MOUTH AT EVENING MEAL 180 tablet 0  . hydrocortisone 1 %  ointment Apply 1 application topically 2 (two) times daily. 30 g 0  . ketoconazole (NIZORAL) 2 % cream Apply 1 application topically daily. 15 g 0  . lactulose (CHRONULAC) 10 GM/15ML solution Take 15 mLs by mouth in the morning and at bedtime.    Marland Kitchen levothyroxine (SYNTHROID) 100 MCG tablet Take 1 tablet (100 mcg total) by mouth daily. 90 tablet 2  . linaclotide (LINZESS) 145 MCG CAPS capsule Take 1 capsule (145 mcg total) by mouth daily before breakfast. 30 capsule 3  . montelukast (SINGULAIR) 10 MG tablet TAKE 1 TABLET BY MOUTH DAILY FOR ALLERGIES 90 tablet 3  . Multiple Vitamins-Calcium (VIACTIV MULTI-VITAMIN) CHEW Chew 1 tablet by mouth daily.    Marland Kitchen nystatin-triamcinolone ointment (MYCOLOG) Apply 1 application topically 2 (two) times daily as needed. 30 g 2  . phenazopyridine (PYRIDIUM) 200 MG tablet Take 1 tablet (200 mg total) by mouth 3 (three) times daily as needed for pain. 10 tablet 0  . Probiotic Product (PROBIOTIC-10 PO) Take by mouth.    . senna-docusate (SENOKOT-S) 8.6-50 MG tablet Take 8.6-50 tablets by mouth 1 day or 1 dose.    . sertraline (ZOLOFT) 50 MG tablet Take 1 tablet (50 mg total) by mouth daily. 90 tablet 0  . traZODone (DESYREL) 50 MG tablet Take 1 tablet (50 mg total) by mouth at bedtime as needed. 90 tablet 0  . nitrofurantoin, macrocrystal-monohydrate, (MACROBID) 100 MG capsule Take 1 capsule (100 mg total) by mouth 2 (two) times daily. (Patient not taking: Reported on 03/07/2020) 20 capsule 0  . ranitidine (ZANTAC) 150 MG tablet Take 150 mg by mouth 2 (two) times daily. (Patient not taking: Reported on 03/07/2020)     No current facility-administered medications on file prior to visit.    Objective:     Vitals:   03/07/20 0958  BP: 117/76   Pulse: 86              Physical examination General NAD, Conversant  HEENT Atraumatic; Op clear with mmm.  Normo-cephalic. Pupils reactive. Anicteric sclerae  Thyroid/Neck Smooth without nodularity or enlargement. Normal ROM.  Neck Supple.  Skin No rashes, lesions or ulceration. Normal palpated skin turgor. No nodularity.  Breasts: No masses or discharge.  Symmetric.  No axillary adenopathy.  Lungs: Clear to auscultation.No rales or wheezes. Normal Respiratory effort, no retractions.  Heart: NSR.  No murmurs or rubs appreciated. No periferal edema  Abdomen: Soft.  Non-tender.  No masses.  No HSM. No hernia  Extremities: Moves all appropriately.  Normal ROM for age. No lymphadenopathy.  Neuro: Oriented to PPT.  Normal mood. Normal affect.     Pelvic:   Vulva: Normal appearance.  No lesions.  Vagina: No lesions or abnormalities noted.  Moderate vaginal atrophy noted  Support:  Pelvic relaxation present second-degree  Urethra No masses tenderness or scarring.  Meatus Normal size without lesions or prolapse.  Cervix: Normal appearance.  No lesions.  Anus: Normal exam.  No lesions.  Perineum: Normal exam.  No lesions.        Bimanual   Uterus: Normal size.  Non-tender.  Mobile.  AV.  Adnexae: No masses.  Non-tender to palpation.  Cul-de-sac: Negative for abnormality.      Assessment:    [id number] Patient Active Problem List   Diagnosis Date Noted  . Urinary tract infection with hematuria 02/28/2020  . Dysuria 02/28/2020  . Cardiac arrhythmia 02/28/2020  . Abnormal weight gain 08/23/2019  . Encounter for general adult medical examination with abnormal  findings 08/20/2018  . Irritable bowel syndrome with constipation 08/20/2018  . Depression, major, single episode, moderate ([place]) 08/20/2018  . Chickenpox 08/16/2018  . Environmental allergies 08/16/2018  . Menstrual abnormality 08/16/2018  . Pain in left hip 02/23/2018  . Fatigue 02/23/2018  . Raynaud's phenomenon without  gangrene 02/23/2018  . Hypothyroidism 10/29/2016  . Vaginal atrophy 10/29/2016  . Dyspareunia in female 10/29/2016  . Obesity (BMI 30.0-34.9) 10/29/2016  . Menopause 10/29/2016  . Tendinitis of right rotator cuff 03/02/2016  . Injury of tendon of long head of right biceps 03/02/2016  . 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  . 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 Obstructive sleep apnea syndrome 11/27/2015  . Morbid obesity ([place]) 08/21/2015  . Lumbar radiculitis 08/12/2015  . Shortness of breath 07/29/2015  . Abnormal ECG 07/15/2015  . Anxiety disorder 06/12/2015  . Multiple joint pain 06/12/2015  . DDD (degenerative disc disease), lumbar 06/19/2014  . Knee contusion 02/27/2014     1. Well woman exam with routine gynecological exam   2. Encounter for screening mammogram for malignant neoplasm of breast        Plan:            1.  Basic Screening Recommendations The basic screening recommendations for asymptomatic women were discussed with the patient during her visit.  The age-appropriate recommendations were discussed with her and the rational for the tests reviewed.  When I am informed by the patient that another primary care physician has previously obtained the age-appropriate tests and they are up-to-date, only outstanding tests are ordered and referrals given as necessary.  Abnormal results of tests will be discussed with her when all of her results are completed.  Routine preventative health maintenance measures emphasized: Exercise/Diet/Weight control, Tobacco Warnings, Alcohol/Substance use risks and Stress Management Pap next year -mammogram ordered for August Orders Orders Placed This Encounter  Procedures  . MM 3D SCREEN BREAST BILATERAL    No orders of the defined types were placed in this encounter.       F/U  Return in about 1 year (around 03/07/2021) for Annual Physical.  Finis Bud,  M.D. 03/07/2020 10:26 AM

## 2020-03-08 ENCOUNTER — Telehealth: Payer: Self-pay

## 2020-03-08 NOTE — Telephone Encounter (Signed)
Confirmed and screened for 03-12-20 ov. °

## 2020-03-12 ENCOUNTER — Other Ambulatory Visit: Payer: Self-pay

## 2020-03-12 ENCOUNTER — Encounter: Payer: Self-pay | Admitting: Nurse Practitioner

## 2020-03-12 ENCOUNTER — Ambulatory Visit (INDEPENDENT_AMBULATORY_CARE_PROVIDER_SITE_OTHER): Payer: BC Managed Care – PPO | Admitting: Nurse Practitioner

## 2020-03-12 VITALS — BP 124/74 | HR 75 | Temp 97.5°F | Resp 16 | Ht 64.0 in | Wt 197.4 lb

## 2020-03-12 DIAGNOSIS — R002 Palpitations: Secondary | ICD-10-CM | POA: Diagnosis not present

## 2020-03-12 DIAGNOSIS — I499 Cardiac arrhythmia, unspecified: Secondary | ICD-10-CM

## 2020-03-12 NOTE — Progress Notes (Signed)
[facility] [place], [place] [address]  Internal MEDICINE  Office Visit Note  Patient Name: Sherry Evans  [id number]  [id number]  Date of Service: 03/13/2020  Chief Complaint  Patient presents with  . Follow-up    Review echo  . Depression  . Gastroesophageal Reflux    The patient is here for follow up. She had been having frequent palpitations. We did ECG at her last visit which did show normal sinus rhythm with frequent atopic ventricular beats. An echo was done after this. She has normal LVEF and normal diastolic functions. There is trace tricuspid and mitral valve regurgitation. Lab work was also checked with normal results.  In the past, patient had gastric restriction surgery to help with weight loss. She continues to see Dr. Darnell Level, who was her surgeon. She had been taking phentermine to help her with continuing to lose weight. She states that she had reached out to his office since her last visit to me. He suggested she stop her phentermine. She has been off this medication for about two weeks now. She has not noted any difference in the frequency or severity of palpitations.       Current Medication: Outpatient Encounter Medications as of 03/12/2020  Medication Sig  . azelastine (ASTELIN) 0.1 % nasal spray Place 2 sprays into both nostrils 2 (two) times daily as needed for allergies. Use in each nostril as directed  . cetirizine (ZYRTEC) 10 MG tablet Take 10 mg by mouth daily.  . Cholecalciferol (VITAMIN D3 SUPER STRENGTH) 2000 units TABS Take 2,000 Units by mouth daily.  Marland Kitchen gabapentin (NEURONTIN) 600 MG tablet TAKE 2 TABLETS BY MOUTH AT EVENING MEAL  . hydrocortisone 1 % ointment Apply 1 application topically 2 (two) times daily.  Marland Kitchen ketoconazole (NIZORAL) 2 % cream Apply 1 application topically daily.  Marland Kitchen lactulose (CHRONULAC) 10 GM/15ML solution Take 15 mLs by mouth in the morning and at bedtime.  Marland Kitchen levothyroxine (SYNTHROID) 100 MCG tablet Take 1  tablet (100 mcg total) by mouth daily.  Marland Kitchen linaclotide (LINZESS) 145 MCG CAPS capsule Take 1 capsule (145 mcg total) by mouth daily before breakfast.  . montelukast (SINGULAIR) 10 MG tablet TAKE 1 TABLET BY MOUTH DAILY FOR ALLERGIES  . Multiple Vitamins-Calcium (VIACTIV MULTI-VITAMIN) CHEW Chew 1 tablet by mouth daily.  . nitrofurantoin, macrocrystal-monohydrate, (MACROBID) 100 MG capsule Take 1 capsule (100 mg total) by mouth 2 (two) times daily.  Marland Kitchen nystatin-triamcinolone ointment (MYCOLOG) Apply 1 application topically 2 (two) times daily as needed.  . phenazopyridine (PYRIDIUM) 200 MG tablet Take 1 tablet (200 mg total) by mouth 3 (three) times daily as needed for pain.  . Probiotic Product (PROBIOTIC-10 PO) Take by mouth.  . ranitidine (ZANTAC) 150 MG tablet Take 150 mg by mouth 2 (two) times daily.   Marland Kitchen senna-docusate (SENOKOT-S) 8.6-50 MG tablet Take 8.6-50 tablets by mouth 1 day or 1 dose.  . sertraline (ZOLOFT) 50 MG tablet Take 1 tablet (50 mg total) by mouth daily.  . traZODone (DESYREL) 50 MG tablet Take 1 tablet (50 mg total) by mouth at bedtime as needed.   No facility-administered encounter medications on file as of 03/12/2020.    Surgical History: Past Surgical History:  Procedure Laterality Date  . adnoids removed  1969  . BACK SURGERY  2008  . BICEPT TENODESIS Right 04/02/2016   Procedure: BICEPS TENODESIS;  Surgeon: Corky Mull, MD;  Location: [facility] ORS;  Service: Orthopedics;  Laterality: Right;  . BLADDER SUSPENSION  2009  . BREAST BIOPSY Left 1990's   benign  . BREAST BIOPSY Left October 28, 2015   SMALL FRAGMENTS OF LYMPHOID TISSUE  . CHOLECYSTECTOMY    . COLONOSCOPY    . DILATION AND CURETTAGE OF UTERUS    . GASTRIC RESTRICTION SURGERY  08/23/15  . HERNIA REPAIR  2016   hiatal  . JOINT REPLACEMENT Bilateral 2014   Partial Knee Replacement,  Surgeon: Dr. Little Ishikawa, Brooke Bonito. [place] SURGERY  2014  . SHOULDER ARTHROSCOPY WITH DEBRIDEMENT AND BICEP TENDON  REPAIR Right 04/02/2016   Procedure: SHOULDER ARTHROSCOPY WITH DEBRIDEMENT ;  Surgeon: Corky Mull, MD;  Location: [facility] ORS;  Service: Orthopedics;  Laterality: Right;  . SHOULDER ARTHROSCOPY WITH OPEN ROTATOR CUFF REPAIR Right 04/02/2016   Procedure: SHOULDER ARTHROSCOPY WITH OPEN ROTATOR CUFF REPAIR;  Surgeon: Corky Mull, MD;  Location: [facility] ORS;  Service: Orthopedics;  Laterality: Right;  . SHOULDER ARTHROSCOPY WITH SUBACROMIAL DECOMPRESSION Right 04/02/2016   Procedure: SHOULDER ARTHROSCOPY WITH SUBACROMIAL DECOMPRESSION;  Surgeon: Corky Mull, MD;  Location: [facility] ORS;  Service: Orthopedics;  Laterality: Right;    Medical History: Past Medical History:  Diagnosis Date  . Anxiety   . Depression   . GERD (gastroesophageal reflux disease)   . History of sinus surgery    3 times  . Hypothyroidism   . Sleep apnea    use C-PAP  . Trigger finger     Family History: Family History  Problem Relation Age of Onset  . Hypertension Father   . Diabetes Father   . Breast cancer Maternal Aunt        great aunts. 70-80  . Breast cancer Maternal Grandmother 69       metatastic to breast  . Ovarian cancer Maternal Grandmother   . Breast cancer Paternal Grandmother        17's  . Colon cancer Neg Hx     Social History   Socioeconomic History  . Marital status: Married    Spouse name: Not on file  . Number of children: Not on file  . Years of education: Not on file  . Highest education level: Not on file  Occupational History  . Not on file  Tobacco Use  . Smoking status: Never Smoker  . Smokeless tobacco: Never Used  Vaping Use  . Vaping Use: Never used  Substance and Sexual Activity  . Alcohol use: No    Alcohol/week: 0.0 standard drinks  . Drug use: No  . Sexual activity: Yes    Birth control/protection: Post-menopausal  Other Topics Concern  . Not on file  Social History Narrative  . Not on file   Social Determinants of Health   Financial Resource Strain:   .  Difficulty of Paying Living Expenses:   Food Insecurity:   . Worried About Charity fundraiser in the Last Year:   . Arboriculturist in the Last Year:   Transportation Needs:   . Film/video editor (Medical):   Marland Kitchen Lack of Transportation (Non-Medical):   Physical Activity:   . Days of Exercise per Week:   . Minutes of Exercise per Session:   Stress:   . Feeling of Stress :   Social Connections:   . Frequency of Communication with Friends and Family:   . Frequency of Social Gatherings with Friends and Family:   . Attends Religious Services:   . Active Member of Clubs or Organizations:   . Attends Club  or Organization Meetings:   Marland Kitchen Marital Status:   Intimate Partner Violence:   . Fear of Current or Ex-Partner:   . Emotionally Abused:   Marland Kitchen Physically Abused:   . Sexually Abused:       Review of Systems  Constitutional: Negative for activity change, chills, fatigue and unexpected weight change.  HENT: Negative for congestion, postnasal drip, rhinorrhea, sneezing and sore throat.   Respiratory: Negative for cough, chest tightness, shortness of breath and wheezing.   Cardiovascular: Positive for palpitations. Negative for chest pain.  Gastrointestinal: Negative for abdominal pain, constipation, diarrhea, nausea and vomiting.  Endocrine: Negative for cold intolerance, heat intolerance, polydipsia and polyuria.  Musculoskeletal: Negative for arthralgias, back pain, joint swelling and neck pain.  Skin: Negative for rash.  Allergic/Immunologic: Negative for environmental allergies.  Neurological: Negative for dizziness, tremors, numbness and headaches.  Hematological: Negative for adenopathy. Does not bruise/bleed easily.  Psychiatric/Behavioral: Negative for behavioral problems (Depression), sleep disturbance and suicidal ideas. The patient is not nervous/anxious.     Today's Vitals   03/12/20 1620  BP: 124/74  Pulse: 75  Resp: 16  Temp: (!) 97.5 F (36.4 C)  SpO2: 100%   Weight: 197 lb 6.4 oz (89.5 kg)  Height: 5\' 4"  (1.626 m)   Body mass index is 33.88 kg/m.  Physical Exam Vitals and nursing note reviewed.  Constitutional:      General: She is not in acute distress.    Appearance: Normal appearance. She is well-developed. She is not diaphoretic.  HENT:     Head: Normocephalic and atraumatic.     Nose: Nose normal.     Mouth/Throat:     Pharynx: No oropharyngeal exudate.  Eyes:     Pupils: Pupils are equal, round, and reactive to light.  Neck:     Thyroid: No thyromegaly.     Vascular: No JVD.     Trachea: No tracheal deviation.  Cardiovascular:     Rate and Rhythm: Normal rate and regular rhythm.     Heart sounds: Normal heart sounds. No murmur heard.  No friction rub. No gallop.   Pulmonary:     Effort: Pulmonary effort is normal. No respiratory distress.     Breath sounds: Normal breath sounds. No wheezing or rales.  Chest:     Chest wall: No tenderness.  Abdominal:     Palpations: Abdomen is soft.  Musculoskeletal:        General: Normal range of motion.     Cervical back: Normal range of motion and neck supple.  Lymphadenopathy:     Cervical: No cervical adenopathy.  Skin:    General: Skin is warm and dry.  Neurological:     Mental Status: She is alert and oriented to person, place, and time.     Cranial Nerves: No cranial nerve deficit.  Psychiatric:        Mood and Affect: Mood normal.        Behavior: Behavior normal.        Thought Content: Thought content normal.        Judgment: Judgment normal.    Assessment/Plan: 1. Cardiac arrhythmia, unspecified cardiac arrhythmia type Reviewed echo results with the patient. She has normal LVEF and normal diastolic function.  There is trace mitral and tricuspid regurgitation. Labs checked were normal. Will set patient up for 48 hour holter event monitor for further evaluation.  - Holter monitor - 48 hour; Future  2. Palpitations Reviewed echo results with the patient. She has  normal LVEF and normal diastolic function.  There is trace mitral and tricuspid regurgitation. Labs checked were normal. Will set patient up for 48 hour holter event monitor for further evaluation.    General Counseling: Aneeka verbalizes understanding of the findings of todays visit and agrees with plan of treatment. I have discussed any further diagnostic evaluation that may be needed or ordered today. We also reviewed her medications today. she has been encouraged to call the office with any questions or concerns that should arise related to todays visit.   This patient was seen by Leretha Pol FNP Collaboration with Dr Lavera Guise as a part of collaborative care agreement  Orders Placed This Encounter  Procedures  . Holter monitor - 48 hour      Total time spent: 20 Minutes   Time spent includes review of chart, medications, test results, and follow up plan with the patient.      Dr Lavera Guise Internal medicine

## 2020-03-13 DIAGNOSIS — R002 Palpitations: Secondary | ICD-10-CM | POA: Insufficient documentation

## 2020-03-19 ENCOUNTER — Other Ambulatory Visit: Payer: Self-pay

## 2020-03-19 ENCOUNTER — Ambulatory Visit
Admission: RE | Admit: 2020-03-19 | Discharge: 2020-03-19 | Disposition: A | Discharge status: Home / Self Care | Payer: BC Managed Care – PPO | Source: Ambulatory Visit | Attending: Nurse Practitioner | Admitting: Nurse Practitioner

## 2020-03-19 DIAGNOSIS — I499 Cardiac arrhythmia, unspecified: Secondary | ICD-10-CM | POA: Insufficient documentation

## 2020-03-28 ENCOUNTER — Telehealth: Payer: Self-pay

## 2020-03-28 ENCOUNTER — Other Ambulatory Visit: Payer: Self-pay | Admitting: Nurse Practitioner

## 2020-03-28 DIAGNOSIS — R002 Palpitations: Secondary | ICD-10-CM

## 2020-03-28 DIAGNOSIS — I499 Cardiac arrhythmia, unspecified: Secondary | ICD-10-CM

## 2020-03-28 NOTE — Telephone Encounter (Signed)
holter monitor place send message that  Ms. Spires had a holter monitor put on 6/29. The monitor malfunctioned and that cannot get the data off of the monitor.They have been working on the issue and do not have a resolution. You may want to send her to one of the cardiology groups in town for a monitor.spoke with pt and we did referral for cardiologist

## 2020-03-28 NOTE — Progress Notes (Signed)
Patient referred to Dr. Nehemiah Massed, cardiology, due to problems with holter event monitor ordered by this office. Sherry Evans- Ms. Demos had a holter monitor put on 6/29. The monitor malfunctioned and we cannot get the data off of the monitor. We have been working on the issue and do not have a resolution. You may want to send her to one of the cardiology groups in town for a monitor. They have patch monitors available at the offices, both [facility] caridiology & [facility] cardiology. Sorry for the incovenenice. We do not want to hold up her care. Pt did document some heart pounding and slight chest pain on the diary she brought back. Thanks,

## 2020-03-29 ENCOUNTER — Other Ambulatory Visit: Payer: Self-pay

## 2020-03-29 MED ORDER — GABAPENTIN 600 MG PO TABS: ORAL_TABLET | ORAL | 0 refills | Status: DC

## 2020-03-31 DIAGNOSIS — I499 Cardiac arrhythmia, unspecified: Secondary | ICD-10-CM | POA: Diagnosis not present

## 2020-04-01 NOTE — Progress Notes (Signed)
Discuss with patient at visit 04/22/20

## 2020-04-12 DIAGNOSIS — E782 Mixed hyperlipidemia: Secondary | ICD-10-CM | POA: Insufficient documentation

## 2020-04-22 ENCOUNTER — Ambulatory Visit: Payer: BC Managed Care – PPO | Admitting: Nurse Practitioner

## 2020-04-24 ENCOUNTER — Ambulatory Visit
Admission: RE | Admit: 2020-04-24 | Discharge: 2020-04-24 | Disposition: A | Discharge status: Home / Self Care | Payer: BC Managed Care – PPO | Source: Ambulatory Visit | Attending: Obstetrics and Gynecology | Admitting: Obstetrics and Gynecology

## 2020-04-24 ENCOUNTER — Other Ambulatory Visit: Payer: Self-pay

## 2020-04-24 DIAGNOSIS — Z1231 Encounter for screening mammogram for malignant neoplasm of breast: Secondary | ICD-10-CM | POA: Diagnosis present

## 2020-05-08 ENCOUNTER — Encounter: Payer: Self-pay | Admitting: Internal Medicine

## 2020-05-08 ENCOUNTER — Ambulatory Visit: Payer: BC Managed Care – PPO | Admitting: Surgery

## 2020-05-08 ENCOUNTER — Ambulatory Visit (INDEPENDENT_AMBULATORY_CARE_PROVIDER_SITE_OTHER): Payer: BC Managed Care – PPO | Admitting: Internal Medicine

## 2020-05-08 ENCOUNTER — Other Ambulatory Visit: Payer: Self-pay

## 2020-05-08 ENCOUNTER — Encounter: Payer: Self-pay | Admitting: Surgery

## 2020-05-08 VITALS — BP 100/76 | HR 75 | Temp 97.1°F | Resp 16 | Ht 64.0 in | Wt 201.8 lb

## 2020-05-08 VITALS — BP 125/82 | HR 69 | Temp 98.7°F | Resp 12 | Ht 64.0 in | Wt 201.0 lb

## 2020-05-08 DIAGNOSIS — K645 Perianal venous thrombosis: Secondary | ICD-10-CM

## 2020-05-08 DIAGNOSIS — K219 Gastro-esophageal reflux disease without esophagitis: Secondary | ICD-10-CM | POA: Diagnosis not present

## 2020-05-08 MED ORDER — OMEPRAZOLE 40 MG PO CPDR: 40.0000 mg | DELAYED_RELEASE_CAPSULE | Freq: Every day | ORAL | 3 refills | Status: DC

## 2020-05-08 NOTE — Patient Instructions (Addendum)
Please see your return appointment below.   Hemorrhoids Hemorrhoids are swollen veins that may develop:  In the butt (rectum). These are called internal hemorrhoids.  Around the opening of the butt (anus). These are called external hemorrhoids. Hemorrhoids can cause pain, itching, or bleeding. Most of the time, they do not cause serious problems. They usually get better with diet changes, lifestyle changes, and other home treatments. What are the causes? This condition may be caused by:  Having trouble pooping (constipation).  Pushing hard (straining) to poop.  Watery poop (diarrhea).  Pregnancy.  Being very overweight (obese).  Sitting for long periods of time.  Heavy lifting or other activity that causes you to strain.  Anal sex.  Riding a bike for a long period of time. What are the signs or symptoms? Symptoms of this condition include:  Pain.  Itching or soreness in the butt.  Bleeding from the butt.  Leaking poop.  Swelling in the area.  One or more lumps around the opening of your butt. How is this diagnosed? A doctor can often diagnose this condition by looking at the affected area. The doctor may also:  Do an exam that involves feeling the area with a gloved hand (digital rectal exam).  Examine the area inside your butt using a small tube (anoscope).  Order blood tests. This may be done if you have lost a lot of blood.  Have you get a test that involves looking inside the colon using a flexible tube with a camera on the end (sigmoidoscopy or colonoscopy). How is this treated? This condition can usually be treated at home. Your doctor may tell you to change what you eat, make lifestyle changes, or try home treatments. If these do not help, procedures can be done to remove the hemorrhoids or make them smaller. These may involve:  Placing rubber bands at the base of the hemorrhoids to cut off their blood supply.  Injecting medicine into the hemorrhoids  to shrink them.  Shining a type of light energy onto the hemorrhoids to cause them to fall off.  Doing surgery to remove the hemorrhoids or cut off their blood supply. Follow these instructions at home: Eating and drinking   Eat foods that have a lot of fiber in them. These include whole grains, beans, nuts, fruits, and vegetables.  Ask your doctor about taking products that have added fiber (fibersupplements).  Reduce the amount of fat in your diet. You can do this by: ? Eating low-fat dairy products. ? Eating less red meat. ? Avoiding processed foods.  Drink enough fluid to keep your pee (urine) pale yellow. Managing pain and swelling   Take a warm-water bath (sitz bath) for 20 minutes to ease pain. Do this 3-4 times a day. You may do this in a bathtub or using a portable sitz bath that fits over the toilet.  If told, put ice on the painful area. It may be helpful to use ice between your warm baths. ? Put ice in a plastic bag. ? Place a towel between your skin and the bag. ? Leave the ice on for 20 minutes, 2-3 times a day. General instructions  Take over-the-counter and prescription medicines only as told by your doctor. ? Medicated creams and medicines may be used as told.  Exercise often. Ask your doctor how much and what kind of exercise is best for you.  Go to the bathroom when you have the urge to poop. Do not wait.  Avoid pushing  too hard when you poop.  Keep your butt dry and clean. Use wet toilet paper or moist towelettes after pooping.  Do not sit on the toilet for a long time.  Keep all follow-up visits as told by your doctor. This is important. Contact a doctor if you:  Have pain and swelling that do not get better with treatment or medicine.  Have trouble pooping.  Cannot poop.  Have pain or swelling outside the area of the hemorrhoids. Get help right away if you have:  Bleeding that will not stop. Summary  Hemorrhoids are swollen veins in the  butt or around the opening of the butt.  They can cause pain, itching, or bleeding.  Eat foods that have a lot of fiber in them. These include whole grains, beans, nuts, fruits, and vegetables.  Take a warm-water bath (sitz bath) for 20 minutes to ease pain. Do this 3-4 times a day. This information is not intended to replace advice given to you by your health care provider. Make sure you discuss any questions you have with your health care provider. Document Revised: 09/15/2018 Document Reviewed: 01/27/2018 Elsevier Patient Education  [place].  How to Take a [employer] A sitz bath is a warm water bath that may be used to care for your rectum, genital area, or the area between your rectum and genitals (perineum). For a sitz bath, the water only comes up to your hips and covers your buttocks. A sitz bath may done at home in a bathtub or with a portable sitz bath that fits over the toilet. Your health care provider may recommend a sitz bath to help:  Relieve pain and discomfort after delivering a baby.  Relieve pain and itching from hemorrhoids or anal fissures.  Relieve pain after certain surgeries.  Relax muscles that are sore or tight. How to take a sitz bath Take 3-4 sitz baths a day, or as many as told by your health care provider. Bathtub sitz bath To take a sitz bath in a bathtub: 1. Partially fill a bathtub with warm water. The water should be deep enough to cover your hips and buttocks when you are sitting in the tub. 2. If your health care provider told you to put medicine in the water, follow his or her instructions. 3. Sit in the water. 4. Open the tub drain a little, and leave it open during your bath. 5. Turn on the warm water again, enough to replace the water that is draining out. Keep the water running throughout your bath. This helps keep the water at the right level and the right temperature. 6. Soak in the water for 15-20 minutes, or as long as told by your  health care provider. 7. When you are done, be careful when you stand up. You may feel dizzy. 8. After the sitz bath, pat yourself dry. Do not rub your skin to dry it.  Over-the-toilet sitz bath To take a sitz bath with an over-the-toilet basin: 1. Follow the manufacturer's instructions. 2. Fill the basin with warm water. 3. If your health care provider told you to put medicine in the water, follow his or her instructions. 4. Sit on the seat. Make sure the water covers your buttocks and perineum. 5. Soak in the water for 15-20 minutes, or as long as told by your health care provider. 6. After the sitz bath, pat yourself dry. Do not rub your skin to dry it. 7. Clean and dry the basin between  uses. 8. Discard the basin if it cracks, or according to the manufacturer's instructions. Contact a health care provider if:  Your symptoms get worse. Do not continue with sitz baths if your symptoms get worse.  You have new symptoms. If this happens, do not continue with sitz baths until you talk with your health care provider. Summary  A sitz bath is a warm water bath in which the water only comes up to your hips and covers your buttocks.  A sitz bath may help relieve itching, relieve pain, and relax muscles that are sore or tight in the lower part of your body, including your genital area.  Take 3-4 sitz baths a day, or as many as told by your health care provider. Soak in the water for 15-20 minutes.  Do not continue with sitz baths if your symptoms get worse. This information is not intended to replace advice given to you by your health care provider. Make sure you discuss any questions you have with your health care provider. Document Revised: 02/06/2019 Document Reviewed: 09/09/2017 Elsevier Patient Education  [place].

## 2020-05-08 NOTE — Progress Notes (Signed)
[facility] NEW PATIENT  Referring provider:  Lavera Guise, MD [place],   [id number]  HISTORY OF PRESENT ILLNESS (HPI):  59 y.o. female presents to our office with urgent referral from her PCP (Dr Humphrey Rolls, MD) for evaluation of possible thrombosed hemorrhoid. Patient reports a sudden onset of peri-rectal pain starting yesterday morning. This was sharp in nature and constant without any relief. She reports a history of hemorrhoids in the past and this felt similar to those times but more severe. She tried [employer]	[place] at home without any improvement. No fever, chills, nausea, emesis, abdominal pain, constipation, diarrhea, or bloody stools. She had a history of bariatric surgery and avoids constipation well. She presented to her PCP for the above and there was concern over this being a thrombosed hemorrhoid and she was urgently referred for evaluation. History of colonoscopy 7 years ago with Dr Allen Norris which was unremarkable and she is on 10 year plan. No previous surgeries for hemorrhoids. No other issues or complaints this morning.    PAST MEDICAL HISTORY (PMH):  Past Medical History:  Diagnosis Date  . Anxiety   . Depression   . GERD (gastroesophageal reflux disease)   . History of sinus surgery    3 times  . Hypothyroidism   . Sleep apnea    use C-PAP  . Trigger finger      PAST SURGICAL HISTORY (Icard):  Past Surgical History:  Procedure Laterality Date  . adnoids removed  1969  . BACK SURGERY  2008  . BICEPT TENODESIS Right 04/02/2016   Procedure: BICEPS TENODESIS;  Surgeon: Corky Mull, MD;  Location: [facility] ORS;  Service: Orthopedics;  Laterality: Right;  . BLADDER SUSPENSION  2009  . BREAST BIOPSY Left 1990's   benign  . BREAST BIOPSY Left October 28, 2015   SMALL FRAGMENTS OF LYMPHOID TISSUE  . CHOLECYSTECTOMY    . COLONOSCOPY    . DILATION AND CURETTAGE OF UTERUS    . GASTRIC RESTRICTION SURGERY  08/23/15  . HERNIA REPAIR  2016    hiatal  . JOINT REPLACEMENT Bilateral 2014   Partial Knee Replacement,  Surgeon: Dr. Little Ishikawa, Brooke Bonito. [place] SURGERY  2014  . SHOULDER ARTHROSCOPY WITH DEBRIDEMENT AND BICEP TENDON REPAIR Right 04/02/2016   Procedure: SHOULDER ARTHROSCOPY WITH DEBRIDEMENT ;  Surgeon: Corky Mull, MD;  Location: [facility] ORS;  Service: Orthopedics;  Laterality: Right;  . SHOULDER ARTHROSCOPY WITH OPEN ROTATOR CUFF REPAIR Right 04/02/2016   Procedure: SHOULDER ARTHROSCOPY WITH OPEN ROTATOR CUFF REPAIR;  Surgeon: Corky Mull, MD;  Location: [facility] OR[facility];  Service: Orthopedics;  Laterality: Right;  . SHOULDER ARTHROSCOPY WITH SUBACROMIAL DECOMPRESSION Right 04/02/2016   Procedure: SHOULDER ARTHROSCOPY WITH SUBACROMIAL DECOMPRESSION;  Surgeon: Corky Mull, MD;  Location: [facility] OR[facility];  Service: Orthopedics;  Laterality: Right;     MEDICATIONS:  Prior to Admission medications   Medication Sig Start Date End Date Taking? Authorizing Provider  cetirizine (ZYRTEC) 10 MG tablet Take 10 mg by mouth daily.   Yes [provider]  Cholecalciferol (VITAMIN D3 SUPER STRENGTH) 2000 units TABS Take 2,000 Units by mouth daily.   Yes [provider]  Docusate Sodium 100 MG/5ML ENEM docusate sodium  100mg    Yes [provider]  gabapentin (NEURONTIN) 600 MG tablet TAKE 2 TABLETS BY MOUTH AT EVENING MEAL 03/29/20  Yes Boscia, Heather E, NP  hydrocortisone 1 % ointment Apply 1 application topically 2 (two) times daily. 10/21/18  Yes Scarboro, Audie Clear, NP  ketoconazole (NIZORAL) 2 % cream Apply 1 application topically daily. 10/21/18  Yes Scarboro, Audie Clear, NP  lactulose (CHRONULAC) 10 GM/15ML solution Take 15 mLs by mouth in the morning and at bedtime. 12/12/19  Yes [provider]  levothyroxine (SYNTHROID) 100 MCG tablet Take 1 tablet (100 mcg total) by mouth daily. 02/22/20  Yes Ronnell Freshwater, NP  linaclotide (LINZESS) 145 MCG CAPS capsule Take 1 capsule (145 mcg total) by mouth daily before  breakfast. 09/18/19  Yes Boscia, Heather E, NP  montelukast (SINGULAIR) 10 MG tablet TAKE 1 TABLET BY MOUTH DAILY FOR ALLERGIES 07/10/19  Yes Boscia, Greer Ee, NP  Multiple Vitamins-Calcium (VIACTIV MULTI-VITAMIN) CHEW Chew 1 tablet by mouth daily.   Yes [provider]  nystatin-triamcinolone ointment (MYCOLOG) Apply 1 application topically 2 (two) times daily as needed. 10/28/16  Yes Defrancesco, Alanda Slim, MD  omeprazole (PRILOSEC) 40 MG capsule Take 1 capsule (40 mg total) by mouth daily. 05/08/20  Yes Lavera Guise, MD  Probiotic Product (PROBIOTIC-10 PO) Take by mouth.   Yes [provider]  senna-docusate (SENOKOT-S) 8.6-50 MG tablet Take 8.6-50 tablets by mouth 1 day or 1 dose. 12/12/19 12/11/20 Yes [provider]  sertraline (ZOLOFT) 50 MG tablet Take 1 tablet (50 mg total) by mouth daily. 02/22/20  Yes Ronnell Freshwater, NP  traZODone (DESYREL) 50 MG tablet Take 1 tablet (50 mg total) by mouth at bedtime as needed. 02/22/20  Yes Ronnell Freshwater, NP     ALLERGIES:  Allergies  Allergen Reactions  . Levonorgestrel-Ethinyl Estrad   . Azithromycin Itching and Rash  . Sulfa Antibiotics Rash and Itching     SOCIAL HISTORY:  Social History   Socioeconomic History  . Marital status: Married    Spouse name: Not on file  . Number of children: Not on file  . Years of education: Not on file  . Highest education level: Not on file  Occupational History  . Not on file  Tobacco Use  . Smoking status: Never Smoker  . Smokeless tobacco: Never Used  Vaping Use  . Vaping Use: Never used  Substance and Sexual Activity  . Alcohol use: No    Alcohol/week: 0.0 standard drinks  . Drug use: No  . Sexual activity: Yes    Birth control/protection: Post-menopausal  Other Topics Concern  . Not on file  Social History Narrative  . Not on file   Social Determinants of Health   Financial Resource Strain:   . Difficulty of Paying Living Expenses:   Food Insecurity:   .  Worried About Charity fundraiser in the Last Year:   . Arboriculturist in the Last Year:   Transportation Needs:   . Film/video editor (Medical):   Marland Kitchen Lack of Transportation (Non-Medical):   Physical Activity:   . Days of Exercise per Week:   . Minutes of Exercise per Session:   Stress:   . Feeling of Stress :   Social Connections:   . Frequency of Communication with Friends and Family:   . Frequency of Social Gatherings with Friends and Family:   . Attends Religious Services:   . Active Member of Clubs or Organizations:   . Attends Archivist Meetings:   Marland Kitchen Marital Status:   Intimate Partner Violence:   . Fear of Current or Ex-Partner:   . Emotionally Abused:   Marland Kitchen Physically Abused:   . Sexually Abused:  The patient currently resides (home / rehab facility / nursing home): Home The patient normally is (ambulatory / bedbound): Ambulatory  FAMILY HISTORY:  Family History  Problem Relation Age of Onset  . Hypertension Father   . Diabetes Father   . Breast cancer Maternal Aunt        great aunts. 70-80  . Breast cancer Maternal Grandmother 24       metatastic to breast  . Ovarian cancer Maternal Grandmother   . Breast cancer Paternal Grandmother        27's  . Colon cancer Neg Hx     Otherwise negative/non-contributory.  REVIEW OF SYSTEMS:  Review of Systems  Constitutional: Negative for chills and fever.  Respiratory: Negative for cough and shortness of breath.   Cardiovascular: Negative for chest pain and palpitations.  Gastrointestinal: Negative for abdominal pain, blood in stool, constipation, diarrhea, nausea and vomiting.       + External Hemorrhoid   Genitourinary: Negative for dysuria and urgency.  Neurological: Negative for dizziness and headaches.  All other systems reviewed and are negative.    VITAL SIGNS:  @VSRANGES @     Height: 5\' 4"  (162.6 cm) Weight: 201 lb (91.2 kg) BMI (Calculated): 34.48   PHYSICAL EXAM:  Physical Exam Vitals  and nursing note reviewed. Exam conducted with a chaperone present.  Constitutional:      General: She is not in acute distress.    Appearance: Normal appearance. She is not ill-appearing.  HENT:     Head: Normocephalic.     Mouth/Throat:     Mouth: Mucous membranes are moist.     Pharynx: Oropharynx is clear.  Eyes:     Conjunctiva/sclera: Conjunctivae normal.     Pupils: Pupils are equal, round, and reactive to light.  Cardiovascular:     Rate and Rhythm: Normal rate and regular rhythm.     Pulses: Normal pulses.     Heart sounds: No murmur heard.   Pulmonary:     Effort: Pulmonary effort is normal. No respiratory distress.     Breath sounds: Normal breath sounds.  Genitourinary:   Musculoskeletal:     Right lower leg: No edema.     Left lower leg: No edema.  Skin:    General: Skin is warm and dry.     Coloration: Skin is not pale.     Findings: No erythema.  Neurological:     General: No focal deficit present.     Mental Status: She is alert and oriented to person, place, and time.  Psychiatric:        Mood and Affect: Mood normal.        Behavior: Behavior normal.      Labs: No new labs  Imaging studies:  No pertinent imaging     Assessment/Plan:  59 y.o. female with thrombosed external hemorrhoid.   - Will plan on I&D of thrombosed hemorrhoid in the office. See separate procedure note for details of this  - Recommend Sitz bathes and good hygiene while healing  - She denied any need for pain medication but instructed to call if needed   - return to clinic in 1 week for reassessment  All of the above recommendations were discussed with the patient, and all of patient's questions were answered to her expressed satisfaction.  Thank you for the opportunity to participate in this patient's care.  Face-to-face time spent with the patient and care providers was 45 minutes, with more than 50% of the time  spent counseling, educating, and coordinating care of the  patient.    -- Edison Simon, PA-C [facility] 05/08/2020, 12:19 PM [phone] M-F: 7am - 4pm

## 2020-05-08 NOTE — Progress Notes (Signed)
[facility] [place], [place] [address]  Internal MEDICINE  Office Visit Note  Patient Name: Sherry Evans  [id number]  [id number]  Date of Service: 05/14/2020  Chief Complaint  Patient presents with  . Hemorrhoids    painful, started worse yesterday 05/07/20    HPI Pt is here with c/o painful hemorrhoid. She does have h/o hemorrhoids but this time, it is worse, she cannot sit, denies any constipation     Current Medication: Outpatient Encounter Medications as of 05/08/2020  Medication Sig  . cetirizine (ZYRTEC) 10 MG tablet Take 10 mg by mouth daily.  . Cholecalciferol (VITAMIN D3 SUPER STRENGTH) 2000 units TABS Take 2,000 Units by mouth daily.  Marland Kitchen gabapentin (NEURONTIN) 600 MG tablet TAKE 2 TABLETS BY MOUTH AT EVENING MEAL  . hydrocortisone 1 % ointment Apply 1 application topically 2 (two) times daily.  Marland Kitchen ketoconazole (NIZORAL) 2 % cream Apply 1 application topically daily.  Marland Kitchen lactulose (CHRONULAC) 10 GM/15ML solution Take 15 mLs by mouth in the morning and at bedtime.  Marland Kitchen levothyroxine (SYNTHROID) 100 MCG tablet Take 1 tablet (100 mcg total) by mouth daily.  Marland Kitchen linaclotide (LINZESS) 145 MCG CAPS capsule Take 1 capsule (145 mcg total) by mouth daily before breakfast.  . montelukast (SINGULAIR) 10 MG tablet TAKE 1 TABLET BY MOUTH DAILY FOR ALLERGIES  . Multiple Vitamins-Calcium (VIACTIV MULTI-VITAMIN) CHEW Chew 1 tablet by mouth daily.  Marland Kitchen nystatin-triamcinolone ointment (MYCOLOG) Apply 1 application topically 2 (two) times daily as needed.  . Probiotic Product (PROBIOTIC-10 PO) Take by mouth.  . senna-docusate (SENOKOT-S) 8.6-50 MG tablet Take 8.6-50 tablets by mouth 1 day or 1 dose.  . sertraline (ZOLOFT) 50 MG tablet Take 1 tablet (50 mg total) by mouth daily.  . traZODone (DESYREL) 50 MG tablet Take 1 tablet (50 mg total) by mouth at bedtime as needed.  . [DISCONTINUED] azelastine (ASTELIN) 0.1 % nasal spray Place 2 sprays into both nostrils 2 (two)  times daily as needed for allergies. Use in each nostril as directed  . [DISCONTINUED] ranitidine (ZANTAC) 150 MG tablet Take 150 mg by mouth 2 (two) times daily.   Marland Kitchen omeprazole (PRILOSEC) 40 MG capsule Take 1 capsule (40 mg total) by mouth daily.  . [DISCONTINUED] nitrofurantoin, macrocrystal-monohydrate, (MACROBID) 100 MG capsule Take 1 capsule (100 mg total) by mouth 2 (two) times daily. (Patient not taking: Reported on 05/08/2020)  . [DISCONTINUED] phenazopyridine (PYRIDIUM) 200 MG tablet Take 1 tablet (200 mg total) by mouth 3 (three) times daily as needed for pain. (Patient not taking: Reported on 05/08/2020)   No facility-administered encounter medications on file as of 05/08/2020.    Surgical History: Past Surgical History:  Procedure Laterality Date  . adnoids removed  1969  . BACK SURGERY  2008  . BICEPT TENODESIS Right 04/02/2016   Procedure: BICEPS TENODESIS;  Surgeon: Corky Mull, MD;  Location: [facility] ORS;  Service: Orthopedics;  Laterality: Right;  . BLADDER SUSPENSION  2009  . BREAST BIOPSY Left 1990's   benign  . BREAST BIOPSY Left October 28, 2015   SMALL FRAGMENTS OF LYMPHOID TISSUE  . CHOLECYSTECTOMY    . COLONOSCOPY    . DILATION AND CURETTAGE OF UTERUS    . GASTRIC RESTRICTION SURGERY  08/23/15  . HERNIA REPAIR  2016   hiatal  . JOINT REPLACEMENT Bilateral 2014   Partial Knee Replacement,  Surgeon: Dr. Little Ishikawa, Brooke Bonito. [place] [facility]ERY  2014  . SHOULDER ARTHROSCOPY WITH DEBRIDEMENT AND  BICEP TENDON REPAIR Right 04/02/2016   Procedure: SHOULDER ARTHROSCOPY WITH DEBRIDEMENT ;  Surgeon: Corky Mull, MD;  Location: [facility] ORS;  Service: Orthopedics;  Laterality: Right;  . SHOULDER ARTHROSCOPY WITH OPEN ROTATOR CUFF REPAIR Right 04/02/2016   Procedure: SHOULDER ARTHROSCOPY WITH OPEN ROTATOR CUFF REPAIR;  Surgeon: Corky Mull, MD;  Location: [facility] ORS;  Service: Orthopedics;  Laterality: Right;  . SHOULDER ARTHROSCOPY WITH SUBACROMIAL DECOMPRESSION Right 04/02/2016    Procedure: SHOULDER ARTHROSCOPY WITH SUBACROMIAL DECOMPRESSION;  Surgeon: Corky Mull, MD;  Location: [facility] ORS;  Service: Orthopedics;  Laterality: Right;    Medical History: Past Medical History:  Diagnosis Date  . Anxiety   . Depression   . GERD (gastroesophageal reflux disease)   . History of sinus surgery    3 times  . Hypothyroidism   . Sleep apnea    use C-PAP  . Trigger finger     Family History: Family History  Problem Relation Age of Onset  . Hypertension Father   . Diabetes Father   . Breast cancer Maternal Aunt        great aunts. 70-80  . Breast cancer Maternal Grandmother 47       metatastic to breast  . Ovarian cancer Maternal Grandmother   . Breast cancer Paternal Grandmother        93's  . Colon cancer Neg Hx     Social History   Socioeconomic History  . Marital status: Married    Spouse name: Not on file  . Number of children: Not on file  . Years of education: Not on file  . Highest education level: Not on file  Occupational History  . Not on file  Tobacco Use  . Smoking status: Never Smoker  . Smokeless tobacco: Never Used  Vaping Use  . Vaping Use: Never used  Substance and Sexual Activity  . Alcohol use: No    Alcohol/week: 0.0 standard drinks  . Drug use: No  . Sexual activity: Yes    Birth control/protection: Post-menopausal  Other Topics Concern  . Not on file  Social History Narrative  . Not on file   Social Determinants of Health   Financial Resource Strain:   . Difficulty of Paying Living Expenses: Not on file  Food Insecurity:   . Worried About Charity fundraiser in the Last Year: Not on file  . Ran Out of Food in the Last Year: Not on file  Transportation Needs:   . Lack of Transportation (Medical): Not on file  . Lack of Transportation (Non-Medical): Not on file  Physical Activity:   . Days of Exercise per Week: Not on file  . Minutes of Exercise per Session: Not on file  Stress:   . Feeling of Stress : Not on  file  Social Connections:   . Frequency of Communication with Friends and Family: Not on file  . Frequency of Social Gatherings with Friends and Family: Not on file  . Attends Religious Services: Not on file  . Active Member of Clubs or Organizations: Not on file  . Attends Archivist Meetings: Not on file  . Marital Status: Not on file  Intimate Partner Violence:   . Fear of Current or Ex-Partner: Not on file  . Emotionally Abused: Not on file  . Physically Abused: Not on file  . Sexually Abused: Not on file     Review of Systems  Constitutional: Negative.   Respiratory: Negative.   Gastrointestinal: Positive for  anal bleeding and rectal pain.  Genitourinary: Negative.     Vital Signs: BP 100/76   Pulse 75   Temp (!) 97.1 F (36.2 C)   Resp 16   Ht 5\' 4"  (1.626 m)   Wt 201 lb 12.8 oz (91.5 kg)   LMP 01/03/2016   SpO2 99%   BMI 34.64 kg/m    Physical Exam Abdominal:     General: Abdomen is flat.     Comments: Large size thrombosed hemorrhoid     Assessment/Plan: 1. External thrombosed hemorrhoids Large size thrombosed hemorrhoid  - Ambulatory referral to General Surgery  2. Gastroesophageal reflux disease without esophagitis - omeprazole (PRILOSEC) 40 MG capsule; Take 1 capsule (40 mg total) by mouth daily.  Dispense: 90 capsule; Refill: 3  General Counseling: Saraann verbalizes understanding of the findings of todays visit and agrees with plan of treatment. I have discussed any further diagnostic evaluation that may be needed or ordered today. We also reviewed her medications today. she has been encouraged to call the office with any questions or concerns that should arise related to todays visit.    Orders Placed This Encounter  Procedures  . Ambulatory referral to General Surgery    Meds ordered this encounter  Medications  . omeprazole (PRILOSEC) 40 MG capsule    Sig: Take 1 capsule (40 mg total) by mouth daily.    Dispense:  90 capsule     Refill:  3    Total time spent:20 Minutes Time spent includes review of chart, medications, test results, and follow up plan with the patient.      Dr Lavera Guise Internal medicine

## 2020-05-08 NOTE — Progress Notes (Signed)
Procedure Note  Date: 05/08/20  Preforming Provider: Edison Simon, PA-C  Indication: Thrombosed external hemorrhoid  Findings: A large thrombosed hemorrhoid, clot expressed  Anesthesia: 10 ccs 1% Lidocaine with epinephrine   Details: All risks, benefits, and alternatives to above procedure were discussed with the patient, and informed consent was obtained. The area was prepped and draped in sterile fashion. 10 ccs of lidocaine with epinephrine was injected and adequate anesthesia was obtained. Using a 11 blade scalpel a cruciate incision was made over the area of thrombosis and clot was easily expressed. Patient tolerated this well without immediate complications. All sharps were accounted for and disposed of properly at the end of the procedure.   Complications: None  EBL: Minimal, <5 ccs  Plan: RTC in 1 week for re-check   -- Edison Simon, PA-C [facility] 05/08/2020, 1:07 PM [phone] M-F: 7am - 4pm

## 2020-05-14 ENCOUNTER — Ambulatory Visit (INDEPENDENT_AMBULATORY_CARE_PROVIDER_SITE_OTHER): Payer: BC Managed Care – PPO | Admitting: Physician Assistant

## 2020-05-14 ENCOUNTER — Ambulatory Visit: Payer: BC Managed Care – PPO | Admitting: Nurse Practitioner

## 2020-05-14 ENCOUNTER — Encounter: Payer: Self-pay | Admitting: Physician Assistant

## 2020-05-14 ENCOUNTER — Other Ambulatory Visit: Payer: Self-pay

## 2020-05-14 VITALS — BP 123/79 | HR 78 | Temp 98.9°F | Resp 12 | Ht 64.0 in | Wt 200.6 lb

## 2020-05-14 DIAGNOSIS — Z09 Encounter for follow-up examination after completed treatment for conditions other than malignant neoplasm: Secondary | ICD-10-CM

## 2020-05-14 DIAGNOSIS — K645 Perianal venous thrombosis: Secondary | ICD-10-CM

## 2020-05-14 NOTE — Progress Notes (Signed)
[facility] POST-OP OFFICE VISIT  05/14/2020  HPI: Sherry Evans is a 59 y.o. female 6 days s/p incision and drainage of thrombosed external hemorrhoid.    She reports she is doing much better Pain is significantly improved near the point of resolution She does notice intermittent blood from the area.  No fever, chills, constipation No other complaints  Vital signs: BP 123/79   Pulse 78   Temp 98.9 F (37.2 C)   Resp 12   Ht 5\' 4"  (1.626 m)   Wt 200 lb 9.6 oz (91 kg)   LMP 01/03/2016   SpO2 92%   BMI 34.43 kg/m    Physical Exam: Constitutional: Well appearing female, NAD GU: External hemorrhoid with retained clot, I was able to express this, no erythema or tenderness  Assessment/Plan: This is a 59 y.o. female 6 days s/p incision and drainage of thrombosed external hemorrhoid   - No further intervention  - Recommend Sitz bathes and good hygiene while healing             - She denied any need for pain medication but instructed to call if needed   - rtc on as needed basis, advised to cal with questions or concerns  -- Edison Simon, PA-C [facility] 05/14/2020, 3:59 PM [phone] M-F: 7am - 4pm

## 2020-05-14 NOTE — Patient Instructions (Signed)
Continue to do good hygiene and avoid getting constipated.   Follow up as needed, call the office if you have any questions or concerns.

## 2020-06-06 ENCOUNTER — Telehealth: Payer: Self-pay

## 2020-06-06 NOTE — Telephone Encounter (Signed)
Printed patient s records and telephone encounters from 02/2020 until present due to billing issues regarding holter monitor and not having data and records showing no data from holter so pt can take documentation for appealing her bill from [place]. Beth Up front ready for pick up.

## 2020-06-07 ENCOUNTER — Telehealth: Payer: Self-pay | Admitting: Cardiovascular Disease

## 2020-06-07 NOTE — Telephone Encounter (Signed)
Not sure how to address this. I do not see any documentation associated with Dr. Donivan Scull name. Will route our practice administrator Shanda Howells to assist.

## 2020-06-07 NOTE — Telephone Encounter (Signed)
Patient came to hospital and went to [facility] cardiology to have a monitor placed 6/29. Patient has received a bill for $35.97 from Dr. Rockey Situ and patient is confused about this. Patient was told that the monitor was unable to be read and she ended up being sent to Dr. Nehemiah Massed for a second monitor. Patient is wanting to know if there is any documentation of Dr. Rockey Situ being unable to read monitor or anything  Please advise when able

## 2020-06-12 NOTE — Telephone Encounter (Signed)
I have reached out to [employer] in [place]. Where the monitor was placed and waiting for a response.

## 2020-06-12 NOTE — Telephone Encounter (Signed)
Patient calling back in to see if office was able to come up with any information  Please advise

## 2020-06-14 ENCOUNTER — Other Ambulatory Visit: Payer: Self-pay

## 2020-06-14 MED ORDER — GABAPENTIN 600 MG PO TABS: ORAL_TABLET | ORAL | 1 refills | Status: DC

## 2020-06-19 ENCOUNTER — Other Ambulatory Visit: Payer: Self-pay | Admitting: Nurse Practitioner

## 2020-06-19 ENCOUNTER — Encounter: Payer: Self-pay | Admitting: Nurse Practitioner

## 2020-06-19 DIAGNOSIS — J0140 Acute pansinusitis, unspecified: Secondary | ICD-10-CM

## 2020-06-19 MED ORDER — FLUTICASONE PROPIONATE 50 MCG/ACT NA SUSP: 2.0000 | Freq: Every day | NASAL | 6 refills | Status: DC

## 2020-06-19 MED ORDER — AMOXICILLIN-POT CLAVULANATE 875-125 MG PO TABS: 1.0000 | ORAL_TABLET | Freq: Two times a day (BID) | ORAL | 0 refills | Status: DC

## 2020-06-19 NOTE — Telephone Encounter (Signed)
Patient calling back in to check on status of this. Patient was told we are waiting on a response from Shireen Quan  Patient is also curious if Dr. Rockey Situ did read this monitor. Patient has been told conflicting information and would like to be able to use this in her case against these charges. Patient states PCP said nobody was able to read it which is why she was sent for a second monitor   Please advise

## 2020-06-19 NOTE — Progress Notes (Signed)
Patient with complaint of sinusitis and nasal congestion. Sent prescription for augmentin twice daily for 10 days. Also added flonase nasal spray. Use two sprays in both nostrils daily for congestion. Sent ot [facility]

## 2020-06-22 ENCOUNTER — Encounter: Payer: Self-pay | Admitting: Nurse Practitioner

## 2020-06-24 ENCOUNTER — Other Ambulatory Visit: Payer: Self-pay

## 2020-06-24 ENCOUNTER — Telehealth: Payer: Self-pay

## 2020-06-24 MED ORDER — FLUCONAZOLE 150 MG PO TABS
ORAL_TABLET | ORAL | 0 refills | Status: DC
Start: 2020-06-24 — End: 2020-11-20

## 2020-06-24 NOTE — Telephone Encounter (Signed)
Per DFK I sent in diflucan to pharmacy for thrush and called and left pt VM letting her know something was sent to pharmacy.  dbs

## 2020-06-29 ENCOUNTER — Encounter: Payer: Self-pay | Admitting: Nurse Practitioner

## 2020-07-01 NOTE — Telephone Encounter (Signed)
I think this patient should be scheduled for a virtual visit. Can be with anyone.

## 2020-07-04 ENCOUNTER — Ambulatory Visit: Payer: BC Managed Care – PPO | Admitting: Internal Medicine

## 2020-07-23 ENCOUNTER — Other Ambulatory Visit: Payer: Self-pay

## 2020-07-23 MED ORDER — MONTELUKAST SODIUM 10 MG PO TABS: ORAL_TABLET | ORAL | 1 refills | Status: DC

## 2020-08-06 ENCOUNTER — Other Ambulatory Visit: Payer: Self-pay

## 2020-08-06 DIAGNOSIS — K581 Irritable bowel syndrome with constipation: Secondary | ICD-10-CM

## 2020-08-06 MED ORDER — LINACLOTIDE 145 MCG PO CAPS: 145.0000 ug | ORAL_CAPSULE | Freq: Every day | ORAL | 3 refills | Status: DC

## 2020-08-21 ENCOUNTER — Other Ambulatory Visit: Payer: Self-pay

## 2020-08-21 DIAGNOSIS — F321 Major depressive disorder, single episode, moderate: Secondary | ICD-10-CM

## 2020-08-21 MED ORDER — TRAZODONE HCL 50 MG PO TABS: 50.0000 mg | ORAL_TABLET | Freq: Every evening | ORAL | 1 refills | Status: DC | PRN

## 2020-08-23 ENCOUNTER — Encounter: Payer: BC Managed Care – PPO | Admitting: Nurse Practitioner

## 2020-09-26 ENCOUNTER — Telehealth: Payer: Self-pay

## 2020-09-26 NOTE — Telephone Encounter (Signed)
lmom to rs appt for 09-27-20. Toni Amend

## 2020-09-27 ENCOUNTER — Encounter: Payer: BC Managed Care – PPO | Admitting: Nurse Practitioner

## 2020-11-20 ENCOUNTER — Other Ambulatory Visit: Payer: Self-pay

## 2020-11-20 ENCOUNTER — Encounter: Payer: Self-pay | Admitting: Hospice and Palliative Medicine

## 2020-11-20 ENCOUNTER — Ambulatory Visit: Payer: BC Managed Care – PPO | Admitting: Hospice and Palliative Medicine

## 2020-11-20 VITALS — BP 126/70 | HR 90 | Temp 97.5°F | Resp 16 | Ht 64.0 in | Wt 204.4 lb

## 2020-11-20 DIAGNOSIS — F321 Major depressive disorder, single episode, moderate: Secondary | ICD-10-CM

## 2020-11-20 DIAGNOSIS — M2550 Pain in unspecified joint: Secondary | ICD-10-CM

## 2020-11-20 DIAGNOSIS — J309 Allergic rhinitis, unspecified: Secondary | ICD-10-CM

## 2020-11-20 DIAGNOSIS — Z0001 Encounter for general adult medical examination with abnormal findings: Secondary | ICD-10-CM

## 2020-11-20 DIAGNOSIS — K219 Gastro-esophageal reflux disease without esophagitis: Secondary | ICD-10-CM | POA: Diagnosis not present

## 2020-11-20 DIAGNOSIS — E039 Hypothyroidism, unspecified: Secondary | ICD-10-CM

## 2020-11-20 DIAGNOSIS — K581 Irritable bowel syndrome with constipation: Secondary | ICD-10-CM | POA: Diagnosis not present

## 2020-11-20 DIAGNOSIS — R5383 Other fatigue: Secondary | ICD-10-CM

## 2020-11-20 MED ORDER — OMEPRAZOLE 40 MG PO CPDR: 40.0000 mg | DELAYED_RELEASE_CAPSULE | Freq: Every day | ORAL | 3 refills | Status: DC

## 2020-11-20 MED ORDER — GABAPENTIN 600 MG PO TABS: ORAL_TABLET | ORAL | 1 refills | Status: DC

## 2020-11-20 MED ORDER — TRAZODONE HCL 50 MG PO TABS: 50.0000 mg | ORAL_TABLET | Freq: Every evening | ORAL | 1 refills | Status: DC | PRN

## 2020-11-20 MED ORDER — MONTELUKAST SODIUM 10 MG PO TABS: ORAL_TABLET | ORAL | 1 refills | Status: DC

## 2020-11-20 MED ORDER — SENNOSIDES-DOCUSATE SODIUM 8.6-50 MG PO TABS: 1.0000 | ORAL_TABLET | ORAL | 1 refills | Status: DC

## 2020-11-20 MED ORDER — LEVOTHYROXINE SODIUM 100 MCG PO TABS: 100.0000 ug | ORAL_TABLET | Freq: Every day | ORAL | 2 refills | Status: DC

## 2020-11-20 MED ORDER — SERTRALINE HCL 50 MG PO TABS: 50.0000 mg | ORAL_TABLET | Freq: Every day | ORAL | 1 refills | Status: DC

## 2020-11-20 MED ORDER — LINACLOTIDE 145 MCG PO CAPS: 145.0000 ug | ORAL_CAPSULE | Freq: Every day | ORAL | 3 refills | Status: DC

## 2020-11-20 NOTE — Progress Notes (Signed)
[facility] [place], [place] [address]  Internal MEDICINE  Office Visit Note  Patient Name: Sherry Evans  [id number]  [id number]  Date of Service: 11/21/2020  Chief Complaint  Patient presents with  . Annual Exam    Refill request, hair falling out      HPI Pt is here for routine health maintenance examination Overall things have been going well Has noticed over the last few months that her hair is falling out more Known history of hypothyroidism, current on levothyroxine, due for repeating levels Over the summer had hemorrhoid surgery, recovered well, no further issues, takes Linzess as well as Senna for constipation prevention--recommended by GI  Continues with nightly compliance with CPAP for OSA  Annual GYN visits with Dr. Amalia Hailey, also manages annual mammogram screening Colonoscopy completed 2015--recommend repeat screening in 2025  Continues to sleep well, no recent changes in appetite  Current Medication: Outpatient Encounter Medications as of 11/20/2020  Medication Sig  . cetirizine (ZYRTEC) 10 MG tablet Take 10 mg by mouth daily.  . Cholecalciferol (VITAMIN D3 SUPER STRENGTH) 2000 units TABS Take 2,000 Units by mouth daily.  Marland Kitchen gabapentin (NEURONTIN) 600 MG tablet TAKE 2 TABLETS BY MOUTH AT EVENING MEAL  . ketoconazole (NIZORAL) 2 % cream Apply 1 application topically daily.  Marland Kitchen levothyroxine (SYNTHROID) 100 MCG tablet Take 1 tablet (100 mcg total) by mouth daily.  Marland Kitchen linaclotide (LINZESS) 145 MCG CAPS capsule Take 1 capsule (145 mcg total) by mouth daily before breakfast.  . montelukast (SINGULAIR) 10 MG tablet TAKE 1 TABLET BY MOUTH DAILY FOR ALLERGIES  . Multiple Vitamins-Calcium (VIACTIV MULTI-VITAMIN) CHEW Chew 1 tablet by mouth daily.  Marland Kitchen nystatin-triamcinolone ointment (MYCOLOG) Apply 1 application topically 2 (two) times daily as needed.  Marland Kitchen omeprazole (PRILOSEC) 40 MG capsule Take 1 capsule (40 mg total) by mouth daily.  . Probiotic  Product (PROBIOTIC-10 PO) Take by mouth.  . senna-docusate (SENOKOT-S) 8.6-50 MG tablet Take 1 tablet by mouth 1 day or 1 dose.  . sertraline (ZOLOFT) 50 MG tablet Take 1 tablet (50 mg total) by mouth daily.  . traZODone (DESYREL) 50 MG tablet Take 1 tablet (50 mg total) by mouth at bedtime as needed.  . [DISCONTINUED] amoxicillin-clavulanate (AUGMENTIN) 875-125 MG tablet Take 1 tablet by mouth 2 (two) times daily. (Patient not taking: Reported on 11/20/2020)  . [DISCONTINUED] Docusate Sodium 100 MG/5ML ENEM docusate sodium  100mg   . [DISCONTINUED] fluconazole (DIFLUCAN) 150 MG tablet Take one tablet daily for three days for thrush  . [DISCONTINUED] fluticasone (FLONASE) 50 MCG/ACT nasal spray Place 2 sprays into both nostrils daily.  . [DISCONTINUED] gabapentin (NEURONTIN) 600 MG tablet TAKE 2 TABLETS BY MOUTH AT EVENING MEAL  . [DISCONTINUED] lactulose (CHRONULAC) 10 GM/15ML solution Take 15 mLs by mouth in the morning and at bedtime.  . [DISCONTINUED] levothyroxine (SYNTHROID) 100 MCG tablet Take 1 tablet (100 mcg total) by mouth daily.  . [DISCONTINUED] linaclotide (LINZESS) 145 MCG CAPS capsule Take 1 capsule (145 mcg total) by mouth daily before breakfast.  . [DISCONTINUED] montelukast (SINGULAIR) 10 MG tablet TAKE 1 TABLET BY MOUTH DAILY FOR ALLERGIES  . [DISCONTINUED] omeprazole (PRILOSEC) 40 MG capsule Take 1 capsule (40 mg total) by mouth daily.  . [DISCONTINUED] senna-docusate (SENOKOT-S) 8.6-50 MG tablet Take 8.6-50 tablets by mouth 1 day or 1 dose.  . [DISCONTINUED] sertraline (ZOLOFT) 50 MG tablet Take 1 tablet (50 mg total) by mouth daily.  . [DISCONTINUED] traZODone (DESYREL) 50 MG tablet Take 1 tablet (50  mg total) by mouth at bedtime as needed.   No facility-administered encounter medications on file as of 11/20/2020.    Surgical History: Past Surgical History:  Procedure Laterality Date  . adnoids removed  1969  . BACK SURGERY  2008  . BICEPT TENODESIS Right 04/02/2016    Procedure: BICEPS TENODESIS;  Surgeon: Corky Mull, MD;  Location: [facility] ORS;  Service: Orthopedics;  Laterality: Right;  . BLADDER SUSPENSION  2009  . BREAST BIOPSY Left 1990's   benign  . BREAST BIOPSY Left October 28, 2015   SMALL FRAGMENTS OF LYMPHOID TISSUE  . CHOLECYSTECTOMY    . COLONOSCOPY    . DILATION AND CURETTAGE OF UTERUS    . GASTRIC RESTRICTION SURGERY  08/23/15  . HERNIA REPAIR  2016   hiatal  . JOINT REPLACEMENT Bilateral 2014   Partial Knee Replacement,  Surgeon: Dr. Little Ishikawa, Brooke Bonito. [place] SURGERY  2014  . SHOULDER ARTHROSCOPY WITH DEBRIDEMENT AND BICEP TENDON REPAIR Right 04/02/2016   Procedure: SHOULDER ARTHROSCOPY WITH DEBRIDEMENT ;  Surgeon: Corky Mull, MD;  Location: [facility] ORS;  Service: Orthopedics;  Laterality: Right;  . SHOULDER ARTHROSCOPY WITH OPEN ROTATOR CUFF REPAIR Right 04/02/2016   Procedure: SHOULDER ARTHROSCOPY WITH OPEN ROTATOR CUFF REPAIR;  Surgeon: Corky Mull, MD;  Location: [facility] ORS;  Service: Orthopedics;  Laterality: Right;  . SHOULDER ARTHROSCOPY WITH SUBACROMIAL DECOMPRESSION Right 04/02/2016   Procedure: SHOULDER ARTHROSCOPY WITH SUBACROMIAL DECOMPRESSION;  Surgeon: Corky Mull, MD;  Location: [facility] ORS;  Service: Orthopedics;  Laterality: Right;    Medical History: Past Medical History:  Diagnosis Date  . Anxiety   . Depression   . GERD (gastroesophageal reflux disease)   . History of sinus surgery    3 times  . Hypothyroidism   . Sleep apnea    use C-PAP  . Trigger finger     Family History: Family History  Problem Relation Age of Onset  . Hypertension Father   . Diabetes Father   . Breast cancer Maternal Aunt        great aunts. 70-80  . Breast cancer Maternal Grandmother 72       metatastic to breast  . Ovarian cancer Maternal Grandmother   . Breast cancer Paternal Grandmother        10's  . Colon cancer Neg Hx       Review of Systems  Constitutional: Negative for chills, diaphoresis and fatigue.   HENT: Negative for ear pain, postnasal drip and sinus pressure.   Eyes: Negative for photophobia, discharge, redness, itching and visual disturbance.  Respiratory: Negative for cough, shortness of breath and wheezing.   Cardiovascular: Negative for chest pain, palpitations and leg swelling.  Gastrointestinal: Negative for abdominal pain, constipation, diarrhea, nausea and vomiting.  Genitourinary: Negative for dysuria and flank pain.  Musculoskeletal: Negative for arthralgias, back pain, gait problem and neck pain.  Skin: Negative for color change.  Allergic/Immunologic: Negative for environmental allergies and food allergies.  Neurological: Negative for dizziness and headaches.  Hematological: Does not bruise/bleed easily.  Psychiatric/Behavioral: Negative for agitation, behavioral problems (depression) and hallucinations.     Vital Signs: BP 126/70   Pulse 90   Temp (!) 97.5 F (36.4 C)   Resp 16   Ht 5\' 4"  (1.626 m)   Wt 204 lb 6.4 oz (92.7 kg)   LMP 01/03/2016   SpO2 97%   BMI 35.09 kg/m    Physical Exam Vitals reviewed.  Constitutional:  Appearance: Normal appearance. She is obese.  Cardiovascular:     Rate and Rhythm: Normal rate and regular rhythm.     Pulses: Normal pulses.     Heart sounds: Normal heart sounds.  Pulmonary:     Effort: Pulmonary effort is normal.     Breath sounds: Normal breath sounds.  Abdominal:     General: Abdomen is flat.     Palpations: Abdomen is soft.  Musculoskeletal:        General: Normal range of motion.     Cervical back: Normal range of motion.  Skin:    General: Skin is warm.  Neurological:     General: No focal deficit present.     Mental Status: She is alert and oriented to person, place, and time. Mental status is at baseline.  Psychiatric:        Mood and Affect: Mood normal.        Behavior: Behavior normal.        Thought Content: Thought content normal.        Judgment: Judgment normal.      LABS: No  results found for this or any previous visit (from the past 2160 hour(s)).  Assessment/Plan: 1. Encounter for routine adult health examination with abnormal findings Well appearing 60 year old female Up to date on PHM - CBC w/Diff/Platelet - Comprehensive Metabolic Panel (CMET) - Lipid Panel With LDL/HDL Ratio - TSH + free T4 - Vitamin B12 - Folate - Iron and TIBC - Ferritin  2. Acquired hypothyroidism Will updated levels and adjust dosing as indicated - levothyroxine (SYNTHROID) 100 MCG tablet; Take 1 tablet (100 mcg total) by mouth daily.  Dispense: 90 tablet; Refill: 2  3. Irritable bowel syndrome with constipation Stable, requesting refills - linaclotide (LINZESS) 145 MCG CAPS capsule; Take 1 capsule (145 mcg total) by mouth daily before breakfast.  Dispense: 30 capsule; Refill: 3 - senna-docusate (SENOKOT-S) 8.6-50 MG tablet; Take 1 tablet by mouth 1 day or 1 dose.  Dispense: 90 tablet; Refill: 1  4. Gastroesophageal reflux disease without esophagitis Stable, requesting refills - omeprazole (PRILOSEC) 40 MG capsule; Take 1 capsule (40 mg total) by mouth daily.  Dispense: 90 capsule; Refill: 3  5. Depression, major, single episode, moderate (HCC) Stable, requesting refills - sertraline (ZOLOFT) 50 MG tablet; Take 1 tablet (50 mg total) by mouth daily.  Dispense: 90 tablet; Refill: 1 - traZODone (DESYREL) 50 MG tablet; Take 1 tablet (50 mg total) by mouth at bedtime as needed.  Dispense: 90 tablet; Refill: 1  6. Arthralgia, unspecified joint Stable, requesting refills - gabapentin (NEURONTIN) 600 MG tablet; TAKE 2 TABLETS BY MOUTH AT EVENING MEAL  Dispense: 180 tablet; Refill: 1  7. Chronic allergic rhinitis Stable, requesting refills - montelukast (SINGULAIR) 10 MG tablet; TAKE 1 TABLET BY MOUTH DAILY FOR ALLERGIES  Dispense: 90 tablet; Refill: 1  8. Other fatigue - CBC w/Diff/Platelet - Comprehensive Metabolic Panel (CMET) - Lipid Panel With LDL/HDL Ratio - TSH +  free T4 - Vitamin B12 - Folate - Iron and TIBC - Ferritin  General Counseling: Sherry Evans verbalizes understanding of the findings of todays visit and agrees with plan of treatment. I have discussed any further diagnostic evaluation that may be needed or ordered today. We also reviewed her medications today. she has been encouraged to call the office with any questions or concerns that should arise related to todays visit.    Counseling:    Orders Placed This Encounter  Procedures  . CBC  w/Diff/Platelet  . Comprehensive Metabolic Panel (CMET)  . Lipid Panel With LDL/HDL Ratio  . TSH + free T4  . Vitamin B12  . Folate  . Iron and TIBC  . Ferritin    Meds ordered this encounter  Medications  . gabapentin (NEURONTIN) 600 MG tablet    Sig: TAKE 2 TABLETS BY MOUTH AT EVENING MEAL    Dispense:  180 tablet    Refill:  1  . levothyroxine (SYNTHROID) 100 MCG tablet    Sig: Take 1 tablet (100 mcg total) by mouth daily.    Dispense:  90 tablet    Refill:  2  . linaclotide (LINZESS) 145 MCG CAPS capsule    Sig: Take 1 capsule (145 mcg total) by mouth daily before breakfast.    Dispense:  30 capsule    Refill:  3  . montelukast (SINGULAIR) 10 MG tablet    Sig: TAKE 1 TABLET BY MOUTH DAILY FOR ALLERGIES    Dispense:  90 tablet    Refill:  1  . omeprazole (PRILOSEC) 40 MG capsule    Sig: Take 1 capsule (40 mg total) by mouth daily.    Dispense:  90 capsule    Refill:  3  . senna-docusate (SENOKOT-S) 8.6-50 MG tablet    Sig: Take 1 tablet by mouth 1 day or 1 dose.    Dispense:  90 tablet    Refill:  1  . sertraline (ZOLOFT) 50 MG tablet    Sig: Take 1 tablet (50 mg total) by mouth daily.    Dispense:  90 tablet    Refill:  1  . traZODone (DESYREL) 50 MG tablet    Sig: Take 1 tablet (50 mg total) by mouth at bedtime as needed.    Dispense:  90 tablet    Refill:  1    Total time spent 30 Minutes  Time spent includes review of chart, medications, test results, and follow up  plan with the patient.   This patient was seen by Theodoro Grist AGNP-C Collaboration with Dr Lavera Guise as a part of collaborative care agreement   Tanna Furry. [facility] Internal Medicine

## 2020-11-21 ENCOUNTER — Encounter: Payer: Self-pay | Admitting: Hospice and Palliative Medicine

## 2020-12-09 ENCOUNTER — Other Ambulatory Visit: Payer: Self-pay | Admitting: Hospice and Palliative Medicine

## 2020-12-16 ENCOUNTER — Other Ambulatory Visit: Payer: Self-pay

## 2020-12-16 DIAGNOSIS — F321 Major depressive disorder, single episode, moderate: Secondary | ICD-10-CM

## 2020-12-16 DIAGNOSIS — K581 Irritable bowel syndrome with constipation: Secondary | ICD-10-CM

## 2020-12-16 MED ORDER — SENNOSIDES-DOCUSATE SODIUM 8.6-50 MG PO TABS: 1.0000 | ORAL_TABLET | Freq: Every day | ORAL | 1 refills | Status: AC

## 2021-01-04 LAB — TSH+FREE T4
Free T4: 1.15 ng/dL (ref 0.82–1.77)
TSH: 1.48 u[IU]/mL (ref 0.450–4.500)

## 2021-01-04 LAB — CBC WITH DIFFERENTIAL/PLATELET
Basophils Absolute: 0.1 10*3/uL (ref 0.0–0.2)
Basos: 1 %
EOS (ABSOLUTE): 0.1 10*3/uL (ref 0.0–0.4)
Eos: 1 %
Hematocrit: 39.1 % (ref 34.0–46.6)
Hemoglobin: 12.8 g/dL (ref 11.1–15.9)
Immature Grans (Abs): 0 10*3/uL (ref 0.0–0.1)
Immature Granulocytes: 0 %
Lymphocytes Absolute: 2 10*3/uL (ref 0.7–3.1)
Lymphs: 37 %
MCH: 31.4 pg (ref 26.6–33.0)
MCHC: 32.7 g/dL (ref 31.5–35.7)
MCV: 96 fL (ref 79–97)
Monocytes Absolute: 0.4 10*3/uL (ref 0.1–0.9)
Monocytes: 8 %
Neutrophils Absolute: 2.9 10*3/uL (ref 1.4–7.0)
Neutrophils: 53 %
Platelets: 259 10*3/uL (ref 150–450)
RBC: 4.08 x10E6/uL (ref 3.77–5.28)
RDW: 11.9 % (ref 11.7–15.4)
WBC: 5.4 10*3/uL (ref 3.4–10.8)

## 2021-01-04 LAB — FOLATE: Folate: 19.5 ng/mL (ref >3.0)

## 2021-01-04 LAB — COMPREHENSIVE METABOLIC PANEL
ALT: 16 IU/L (ref 0–32)
AST: 23 IU/L (ref 0–40)
Albumin/Globulin Ratio: 1.7 (ref 1.2–2.2)
Albumin: 4.3 g/dL (ref 3.8–4.9)
Alkaline Phosphatase: 77 IU/L (ref 44–121)
BUN/Creatinine Ratio: 26 — ABNORMAL HIGH (ref 9–23)
BUN: 21 mg/dL (ref 6–24)
Bilirubin Total: 0.5 mg/dL (ref 0.0–1.2)
CO2: 22 mmol/L (ref 20–29)
Calcium: 9.5 mg/dL (ref 8.7–10.2)
Chloride: 105 mmol/L (ref 96–106)
Creatinine, Ser: 0.81 mg/dL (ref 0.57–1.00)
Globulin, Total: 2.6 g/dL (ref 1.5–4.5)
Glucose: 94 mg/dL (ref 65–99)
Potassium: 4.3 mmol/L (ref 3.5–5.2)
Sodium: 142 mmol/L (ref 134–144)
Total Protein: 6.9 g/dL (ref 6.0–8.5)
eGFR: 84 mL/min/{1.73_m2} (ref >59)

## 2021-01-04 LAB — IRON AND TIBC
Iron Saturation: 20 % (ref 15–55)
Iron: 69 ug/dL (ref 27–159)
Total Iron Binding Capacity: 338 ug/dL (ref 250–450)
UIBC: 269 ug/dL (ref 131–425)

## 2021-01-04 LAB — LIPID PANEL WITH LDL/HDL RATIO
Cholesterol, Total: 193 mg/dL (ref 100–199)
HDL: 69 mg/dL (ref >39)
LDL Chol Calc (NIH): 109 mg/dL — ABNORMAL HIGH (ref 0–99)
LDL/HDL Ratio: 1.6 ratio (ref 0.0–3.2)
Triglycerides: 82 mg/dL (ref 0–149)
VLDL Cholesterol Cal: 15 mg/dL (ref 5–40)

## 2021-01-04 LAB — VITAMIN B12: Vitamin B-12: 1264 pg/mL — ABNORMAL HIGH (ref 232–1245)

## 2021-01-04 LAB — FERRITIN: Ferritin: 25 ng/mL (ref 15–150)

## 2021-01-06 NOTE — Progress Notes (Signed)
Labs reviewed, will discuss at upcoming visit.

## 2021-02-19 ENCOUNTER — Ambulatory Visit: Payer: BC Managed Care – PPO | Admitting: Nurse Practitioner

## 2021-02-19 ENCOUNTER — Encounter: Payer: Self-pay | Admitting: Nurse Practitioner

## 2021-02-19 ENCOUNTER — Telehealth: Payer: Self-pay

## 2021-02-19 NOTE — Telephone Encounter (Signed)
Lmom for patient to contact our office to schedule a virtual appointment for Covid symptoms,declining Covid test at this time. Sherry Evans

## 2021-02-19 NOTE — Telephone Encounter (Signed)
Please make app for this pt

## 2021-02-19 NOTE — Telephone Encounter (Signed)
Pls call her

## 2021-02-20 ENCOUNTER — Ambulatory Visit (INDEPENDENT_AMBULATORY_CARE_PROVIDER_SITE_OTHER): Payer: BC Managed Care – PPO | Admitting: Internal Medicine

## 2021-02-20 ENCOUNTER — Encounter: Payer: Self-pay | Admitting: Internal Medicine

## 2021-02-20 VITALS — Resp 16 | Ht 64.0 in | Wt 200.0 lb

## 2021-02-20 DIAGNOSIS — J209 Acute bronchitis, unspecified: Secondary | ICD-10-CM | POA: Diagnosis not present

## 2021-02-20 DIAGNOSIS — R0982 Postnasal drip: Secondary | ICD-10-CM | POA: Diagnosis not present

## 2021-02-20 MED ORDER — PREDNISONE 10 MG PO TABS: ORAL_TABLET | ORAL | 0 refills | Status: DC

## 2021-02-20 MED ORDER — LEVOFLOXACIN 500 MG PO TABS: 500.0000 mg | ORAL_TABLET | Freq: Every day | ORAL | 0 refills | Status: DC

## 2021-02-20 NOTE — Progress Notes (Signed)
[facility] [place], [place] [address]  Internal MEDICINE  Telephone Visit  Patient Name: Sherry Evans  [id number]  [id number]  Date of Service: 02/20/2021  I connected with the patient at 930 by telephone and verified the patients identity using two identifiers.   I discussed the limitations, risks, security and privacy concerns of performing an evaluation and management service by telephone and the availability of in person appointments. I also discussed with the patient that there may be a patient responsible charge related to the service.  The patient expressed understanding and agrees to proceed.    Chief Complaint  Patient presents with  . Acute Visit    Allergies and sinus issues in the past, drainage started about 10 days ago, scratchy throat, lost voice Tuesday afternoon, has not been tested for covid, pt does not want to be tested for covid, pt has not been near anyone that has been sick  . Telephone Screen    [phone]  . Telephone Assessment    Phone or video is fine   . Quality Metric Gaps    Pap, shingrix    HPI  Patient is connected for acute symptoms.  All symptoms are noted as above patient does not want to be tested for COVID patient is very was diagnosed close to voice she does have postnasal drip.  Occasional cough is present as well She denies any fever chills and according to her she does not feel bad however she is concerned about losing her voice   Current Medication: Outpatient Encounter Medications as of 02/20/2021  Medication Sig  . levofloxacin (LEVAQUIN) 500 MG tablet Take 1 tablet (500 mg total) by mouth daily.  . predniSONE (DELTASONE) 10 MG tablet Take one tab po tid today and bid tomorrow and just one tab on 3 rd  Day to finish  . cetirizine (ZYRTEC) 10 MG tablet Take 10 mg by mouth daily.  . Cholecalciferol (VITAMIN D3 SUPER STRENGTH) 2000 units TABS Take 2,000 Units by mouth daily.  Marland Kitchen gabapentin (NEURONTIN) 600 MG  tablet TAKE 2 TABLETS BY MOUTH AT EVENING MEAL  . ketoconazole (NIZORAL) 2 % cream Apply 1 application topically daily.  Marland Kitchen levothyroxine (SYNTHROID) 100 MCG tablet Take 1 tablet (100 mcg total) by mouth daily.  Marland Kitchen linaclotide (LINZESS) 145 MCG CAPS capsule Take 1 capsule (145 mcg total) by mouth daily before breakfast.  . montelukast (SINGULAIR) 10 MG tablet TAKE 1 TABLET BY MOUTH DAILY FOR ALLERGIES  . Multiple Vitamins-Calcium (VIACTIV MULTI-VITAMIN) CHEW Chew 1 tablet by mouth daily.  Marland Kitchen nystatin-triamcinolone ointment (MYCOLOG) Apply 1 application topically 2 (two) times daily as needed.  Marland Kitchen omeprazole (PRILOSEC) 20 MG capsule TAKE 2 CAPSULES (40 MG TOTAL) BY MOUTH DAILY FOR 30 DAYS.  Marland Kitchen omeprazole (PRILOSEC) 40 MG capsule Take 1 capsule (40 mg total) by mouth daily.  . Probiotic Product (PROBIOTIC-10 PO) Take by mouth.  . senna-docusate (SENOKOT-S) 8.6-50 MG tablet Take 1 tablet by mouth at bedtime.  . sertraline (ZOLOFT) 50 MG tablet Take 1 tablet (50 mg total) by mouth daily.  . traZODone (DESYREL) 50 MG tablet Take 1 tablet (50 mg total) by mouth at bedtime as needed.   No facility-administered encounter medications on file as of 02/20/2021.    Surgical History: Past Surgical History:  Procedure Laterality Date  . adnoids removed  1969  . BACK SURGERY  2008  . BICEPT TENODESIS Right 04/02/2016   Procedure: BICEPS TENODESIS;  Surgeon: Corky Mull, MD;  Location: [facility] ORS;  Service: Orthopedics;  Laterality: Right;  . BLADDER SUSPENSION  2009  . BREAST BIOPSY Left 1990's   benign  . BREAST BIOPSY Left October 28, 2015   SMALL FRAGMENTS OF LYMPHOID TISSUE  . CHOLECYSTECTOMY    . COLONOSCOPY    . DILATION AND CURETTAGE OF UTERUS    . GASTRIC RESTRICTION SURGERY  08/23/15  . HERNIA REPAIR  2016   hiatal  . JOINT REPLACEMENT Bilateral 2014   Partial Knee Replacement,  Surgeon: Dr. Little Ishikawa, Brooke Bonito. [place] SURGERY  2014  . SHOULDER ARTHROSCOPY WITH DEBRIDEMENT AND BICEP  TENDON REPAIR Right 04/02/2016   Procedure: SHOULDER ARTHROSCOPY WITH DEBRIDEMENT ;  Surgeon: Corky Mull, MD;  Location: [facility] ORS;  Service: Orthopedics;  Laterality: Right;  . SHOULDER ARTHROSCOPY WITH OPEN ROTATOR CUFF REPAIR Right 04/02/2016   Procedure: SHOULDER ARTHROSCOPY WITH OPEN ROTATOR CUFF REPAIR;  Surgeon: Corky Mull, MD;  Location: [facility] ORS;  Service: Orthopedics;  Laterality: Right;  . SHOULDER ARTHROSCOPY WITH SUBACROMIAL DECOMPRESSION Right 04/02/2016   Procedure: SHOULDER ARTHROSCOPY WITH SUBACROMIAL DECOMPRESSION;  Surgeon: Corky Mull, MD;  Location: [facility] ORS;  Service: Orthopedics;  Laterality: Right;    Medical History: Past Medical History:  Diagnosis Date  . Anxiety   . Depression   . GERD (gastroesophageal reflux disease)   . History of sinus surgery    3 times  . Hypothyroidism   . Sleep apnea    use C-PAP  . Trigger finger     Family History: Family History  Problem Relation Age of Onset  . Hypertension Father   . Diabetes Father   . Breast cancer Maternal Aunt        great aunts. 70-80  . Breast cancer Maternal Grandmother 9       metatastic to breast  . Ovarian cancer Maternal Grandmother   . Breast cancer Paternal Grandmother        29's  . Colon cancer Neg Hx     Social History   Socioeconomic History  . Marital status: Married    Spouse name: Not on file  . Number of children: Not on file  . Years of education: Not on file  . Highest education level: Not on file  Occupational History  . Not on file  Tobacco Use  . Smoking status: Never Smoker  . Smokeless tobacco: Never Used  Vaping Use  . Vaping Use: Never used  Substance and Sexual Activity  . Alcohol use: No    Alcohol/week: 0.0 standard drinks  . Drug use: No  . Sexual activity: Yes    Birth control/protection: Post-menopausal  Other Topics Concern  . Not on file  Social History Narrative  . Not on file   Social Determinants of Health   Financial Resource Strain:  Not on file  Food Insecurity: Not on file  Transportation Needs: Not on file  Physical Activity: Not on file  Stress: Not on file  Social Connections: Not on file  Intimate Partner Violence: Not on file      Review of Systems  Constitutional: Negative for fatigue and fever.  HENT: Positive for sore throat and voice change. Negative for congestion, mouth sores and postnasal drip.   Respiratory: Positive for cough.   Cardiovascular: Negative for chest pain.  Genitourinary: Negative for flank pain.  Psychiatric/Behavioral: Negative.     Vital Signs: Resp 16   Ht 5\' 4"  (1.626 m)   Wt 200 lb (90.7 kg)  LMP 01/03/2016   BMI 34.33 kg/m    Observation/Objective: Patient is very hoarse however no acute distress    Assessment/Plan: 1. Acute laryngotracheobronchitis Patient extremely hoarse this has been going on for almost 10 days now we will treat with Levaquin and a short course of prednisone to take care of some of the soft tissue swelling on her vocal cords - levofloxacin (LEVAQUIN) 500 MG tablet; Take 1 tablet (500 mg total) by mouth daily.  Dispense: 7 tablet; Refill: 0 - predniSONE (DELTASONE) 10 MG tablet; Take one tab po tid today and bid tomorrow and just one tab on 3 rd  Day to finish  Dispense: 6 tablet; Refill: 0  2. Post-nasal drip Continue all her medications including Zyrtec Flonase and Singulair if needed we will add azelastine nasal spray in future  General Counseling: Mckinna verbalizes understanding of the findings of today's phone visit and agrees with plan of treatment. I have discussed any further diagnostic evaluation that may be needed or ordered today. We also reviewed her medications today. she has been encouraged to call the office with any questions or concerns that should arise related to todays visit.    No orders of the defined types were placed in this encounter.   Meds ordered this encounter  Medications  . levofloxacin (LEVAQUIN) 500 MG  tablet    Sig: Take 1 tablet (500 mg total) by mouth daily.    Dispense:  7 tablet    Refill:  0  . predniSONE (DELTASONE) 10 MG tablet    Sig: Take one tab po tid today and bid tomorrow and just one tab on 3 rd  Day to finish    Dispense:  6 tablet    Refill:  0    Time spent:15 Minutes    Dr Lavera Guise Internal medicine

## 2021-02-24 ENCOUNTER — Encounter: Payer: Self-pay | Admitting: Internal Medicine

## 2021-03-11 ENCOUNTER — Encounter: Payer: BC Managed Care – PPO | Admitting: Obstetrics and Gynecology

## 2021-03-13 ENCOUNTER — Encounter: Payer: Self-pay | Admitting: Obstetrics and Gynecology

## 2021-03-13 ENCOUNTER — Other Ambulatory Visit: Payer: Self-pay

## 2021-03-13 ENCOUNTER — Other Ambulatory Visit (HOSPITAL_COMMUNITY)
Admission: RE | Admit: 2021-03-13 | Discharge: 2021-03-13 | Disposition: A | Discharge status: Home / Self Care | Payer: BC Managed Care – PPO | Source: Ambulatory Visit | Attending: Obstetrics and Gynecology | Admitting: Obstetrics and Gynecology

## 2021-03-13 ENCOUNTER — Ambulatory Visit (INDEPENDENT_AMBULATORY_CARE_PROVIDER_SITE_OTHER): Payer: BC Managed Care – PPO | Admitting: Obstetrics and Gynecology

## 2021-03-13 VITALS — BP 104/69 | HR 70 | Ht 64.0 in | Wt 207.2 lb

## 2021-03-13 DIAGNOSIS — Z01419 Encounter for gynecological examination (general) (routine) without abnormal findings: Secondary | ICD-10-CM

## 2021-03-13 NOTE — Progress Notes (Signed)
HPI:      [name] is a 60 y.o. [id number] who LMP was Patient's last menstrual period was 01/03/2016.  Subjective:   She presents today for her annual examination.  She has no complaints.  She does her blood work through her PCP.    Hx: The following portions of the patient's history were reviewed and updated as appropriate:             She  has a past medical history of Anxiety, Depression, GERD (gastroesophageal reflux disease), History of sinus surgery, Hypothyroidism, Sleep apnea, and Trigger finger. She does not have any pertinent problems on file. She  has a past surgical history that includes Bladder suspension (2009); Back surgery (2008); Knee surgery (2014); Cholecystectomy; Gastric restriction surgery (08/23/15); Hernia repair (2016); adnoids removed (1969); Colonoscopy; Dilation and curettage of uterus; Joint replacement (Bilateral, 2014); Shoulder arthroscopy with open rotator cuff repair (Right, 04/02/2016); Bicept tenodesis (Right, 04/02/2016); Shoulder arthroscopy with subacromial decompression (Right, 04/02/2016); Shoulder arthroscopy with debridement and bicep tendon repair (Right, 04/02/2016); Breast biopsy (Left, 1990's); and Breast biopsy (Left, October 28, 2015). Her family history includes Breast cancer in her maternal aunt and paternal grandmother; Breast cancer (age of onset: 44) in her maternal grandmother; Diabetes in her father; Hypertension in her father; Ovarian cancer in her maternal grandmother. She  reports that she has never smoked. She has never used smokeless tobacco. She reports that she does not drink alcohol and does not use drugs. She has a current medication list which includes the following prescription(s): cetirizine, cholecalciferol, gabapentin, ketoconazole, levofloxacin, levothyroxine, linaclotide, montelukast, viactiv multi-vitamin, nystatin-triamcinolone ointment, omeprazole, omeprazole, prednisone, probiotic product, senna-docusate, sertraline, and  trazodone. She is allergic to levonorgestrel-ethinyl estrad, azithromycin, and sulfa antibiotics.       Review of Systems:  Review of Systems  Constitutional: Denied constitutional symptoms, night sweats, recent illness, fatigue, fever, insomnia and weight loss.  Eyes: Denied eye symptoms, eye pain, photophobia, vision change and visual disturbance.  Ears/Nose/Throat/Neck: Denied ear, nose, throat or neck symptoms, hearing loss, nasal discharge, sinus congestion and sore throat.  Cardiovascular: Denied cardiovascular symptoms, arrhythmia, chest pain/pressure, edema, exercise intolerance, orthopnea and palpitations.  Respiratory: Denied pulmonary symptoms, asthma, pleuritic pain, productive sputum, cough, dyspnea and wheezing.  Gastrointestinal: Denied, gastro-esophageal reflux, melena, nausea and vomiting.  Genitourinary: Denied genitourinary symptoms including symptomatic vaginal discharge, pelvic relaxation issues, and urinary complaints.  Musculoskeletal: Denied musculoskeletal symptoms, stiffness, swelling, muscle weakness and myalgia.  Dermatologic: Denied dermatology symptoms, rash and scar.  Neurologic: Denied neurology symptoms, dizziness, headache, neck pain and syncope.  Psychiatric: Denied psychiatric symptoms, anxiety and depression.  Endocrine: Denied endocrine symptoms including hot flashes and night sweats.   Meds:   Current Outpatient Medications on File Prior to Visit  Medication Sig Dispense Refill   cetirizine (ZYRTEC) 10 MG tablet Take 10 mg by mouth daily.     Cholecalciferol 50 MCG (2000 UT) TABS Take 2,000 Units by mouth daily.     gabapentin (NEURONTIN) 600 MG tablet TAKE 2 TABLETS BY MOUTH AT EVENING MEAL 180 tablet 1   ketoconazole (NIZORAL) 2 % cream Apply 1 application topically daily. 15 g 0   levofloxacin (LEVAQUIN) 500 MG tablet Take 1 tablet (500 mg total) by mouth daily. 7 tablet 0   levothyroxine (SYNTHROID) 100 MCG tablet Take 1 tablet (100 mcg total) by  mouth daily. 90 tablet 2   linaclotide (LINZESS) 145 MCG CAPS capsule Take 1 capsule (145 mcg total) by mouth daily before breakfast. 30 capsule  3   montelukast (SINGULAIR) 10 MG tablet TAKE 1 TABLET BY MOUTH DAILY FOR ALLERGIES 90 tablet 1   Multiple Vitamins-Calcium (VIACTIV MULTI-VITAMIN) CHEW Chew 1 tablet by mouth daily.     nystatin-triamcinolone ointment (MYCOLOG) Apply 1 application topically 2 (two) times daily as needed. 30 g 2   omeprazole (PRILOSEC) 20 MG capsule TAKE 2 CAPSULES (40 MG TOTAL) BY MOUTH DAILY FOR 30 DAYS. 180 capsule 1   omeprazole (PRILOSEC) 40 MG capsule Take 1 capsule (40 mg total) by mouth daily. 90 capsule 3   predniSONE (DELTASONE) 10 MG tablet Take one tab po tid today and bid tomorrow and just one tab on 3 rd  Day to finish 6 tablet 0   Probiotic Product (PROBIOTIC-10 PO) Take by mouth.     senna-docusate (SENOKOT-S) 8.6-50 MG tablet Take 1 tablet by mouth at bedtime. 90 tablet 1   sertraline (ZOLOFT) 50 MG tablet Take 1 tablet (50 mg total) by mouth daily. 90 tablet 1   traZODone (DESYREL) 50 MG tablet Take 1 tablet (50 mg total) by mouth at bedtime as needed. 90 tablet 1   No current facility-administered medications on file prior to visit.          Objective:     Vitals:   03/13/21 0800  BP: 104/69  Pulse: 70    Filed Weights   03/13/21 0800  Weight: 207 lb 3.2 oz (94 kg)              Physical examination General NAD, Conversant  HEENT Atraumatic; Op clear with mmm.  Normo-cephalic. Pupils reactive. Anicteric sclerae  Thyroid/Neck Smooth without nodularity or enlargement. Normal ROM.  Neck Supple.  Skin No rashes, lesions or ulceration. Normal palpated skin turgor. No nodularity.  Breasts: No masses or discharge.  Symmetric.  No axillary adenopathy.  Lungs: Clear to auscultation.No rales or wheezes. Normal Respiratory effort, no retractions.  Heart: NSR.  No murmurs or rubs appreciated. No periferal edema  Abdomen: Soft.  Non-tender.  No  masses.  No HSM. No hernia  Extremities: Moves all appropriately.  Normal ROM for age. No lymphadenopathy.  Neuro: Oriented to PPT.  Normal mood. Normal affect.     Pelvic:   Vulva: Normal appearance.  No lesions.  Vagina: No lesions moderate vaginal atrophy noted  Support: Second-degree rectocele  Urethra No masses tenderness or scarring.  Meatus Normal size without lesions or prolapse.  Cervix: Normal appearance.  No lesions.  Anus: Normal exam.  No lesions.  Perineum: Normal exam.  No lesions.        Bimanual   Uterus: Normal size.  Non-tender.  Mobile.  AV.  Adnexae: No masses.  Non-tender to palpation.  Cul-de-sac: Negative for abnormality.     Assessment:    [id number] Patient Active Problem List   Diagnosis Date Noted   Hyperlipidemia, mixed 04/12/2020   Palpitations 03/13/2020   Urinary tract infection with hematuria 02/28/2020   Dysuria 02/28/2020   Cardiac arrhythmia 02/28/2020   Gastroesophageal reflux disease 10/18/2019   Status post bariatric surgery 09/20/2019   Abnormal weight gain 08/23/2019   Encounter for general adult medical examination with abnormal findings 08/20/2018   Irritable bowel syndrome with constipation 08/20/2018   Depression, major, single episode, moderate ([place]) 08/20/2018   Chickenpox 08/16/2018   Environmental allergies 08/16/2018   Menstrual abnormality 08/16/2018   Pain in left hip 02/23/2018   Fatigue 02/23/2018   Raynaud's phenomenon without gangrene 02/23/2018   Hypothyroidism 10/29/2016   Vaginal atrophy 10/29/2016  Dyspareunia in female 10/29/2016   Obesity (BMI 30.0-34.9) 10/29/2016   Menopause 10/29/2016   Tendinitis of right rotator cuff 03/02/2016   Injury of tendon of long head of right biceps 03/02/2016   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 Well woman exam with routine gynecological exam        Plan:            1.  Basic Screening Recommendations The basic screening recommendations for asymptomatic women were discussed with the patient during her visit.  The age-appropriate recommendations were discussed with her and the rational for the tests reviewed.  When I am informed by the patient that another primary care physician has previously obtained the age-appropriate tests and they are up-to-date, only outstanding tests are ordered and referrals given as necessary.  Abnormal results of tests will be discussed with her when all of her results are completed.  Routine preventative health maintenance measures emphasized: Exercise/Diet/Weight control, Tobacco Warnings, Alcohol/Substance use risks and Stress Management Pap Co-test performed-mammogram ordered  Orders Orders Placed This Encounter  Procedures   MM DIGITAL SCREENING BILATERAL    No orders of the defined types were placed in this encounter.         F/U  Return in about 1 year (around 03/13/2022) for Annual Physical.  Finis Bud, M.D. 03/13/2021 8:28 AM

## 2021-03-17 ENCOUNTER — Other Ambulatory Visit: Payer: Self-pay | Admitting: Obstetrics and Gynecology

## 2021-03-17 DIAGNOSIS — Z1231 Encounter for screening mammogram for malignant neoplasm of breast: Secondary | ICD-10-CM

## 2021-03-18 LAB — CYTOLOGY - PAP
Adequacy: ABNORMAL
Comment: NEGATIVE
High risk HPV: NEGATIVE

## 2021-03-25 ENCOUNTER — Encounter: Payer: Self-pay | Admitting: Nurse Practitioner

## 2021-03-25 ENCOUNTER — Ambulatory Visit: Payer: BC Managed Care – PPO | Admitting: Nurse Practitioner

## 2021-03-25 ENCOUNTER — Other Ambulatory Visit: Payer: Self-pay

## 2021-03-25 DIAGNOSIS — R21 Rash and other nonspecific skin eruption: Secondary | ICD-10-CM

## 2021-03-25 DIAGNOSIS — E039 Hypothyroidism, unspecified: Secondary | ICD-10-CM

## 2021-03-25 DIAGNOSIS — F321 Major depressive disorder, single episode, moderate: Secondary | ICD-10-CM

## 2021-03-25 DIAGNOSIS — J309 Allergic rhinitis, unspecified: Secondary | ICD-10-CM

## 2021-03-25 DIAGNOSIS — M2550 Pain in unspecified joint: Secondary | ICD-10-CM | POA: Diagnosis not present

## 2021-03-25 DIAGNOSIS — K581 Irritable bowel syndrome with constipation: Secondary | ICD-10-CM

## 2021-03-25 MED ORDER — TRAZODONE HCL 50 MG PO TABS: 50.0000 mg | ORAL_TABLET | Freq: Every evening | ORAL | 1 refills | Status: DC | PRN

## 2021-03-25 MED ORDER — LEVOTHYROXINE SODIUM 100 MCG PO TABS: 100.0000 ug | ORAL_TABLET | Freq: Every day | ORAL | 2 refills | Status: DC

## 2021-03-25 MED ORDER — KETOCONAZOLE 2 % EX CREA: 1.0000 "application " | TOPICAL_CREAM | Freq: Every day | CUTANEOUS | 0 refills | Status: DC

## 2021-03-25 MED ORDER — GABAPENTIN 600 MG PO TABS: ORAL_TABLET | ORAL | 1 refills | Status: DC

## 2021-03-25 MED ORDER — SERTRALINE HCL 50 MG PO TABS: 50.0000 mg | ORAL_TABLET | Freq: Every day | ORAL | 1 refills | Status: DC

## 2021-03-25 MED ORDER — MONTELUKAST SODIUM 10 MG PO TABS: ORAL_TABLET | ORAL | 1 refills | Status: DC

## 2021-03-25 MED ORDER — MELOXICAM 15 MG PO TABS: 15.0000 mg | ORAL_TABLET | Freq: Every day | ORAL | 5 refills | Status: DC

## 2021-03-25 MED ORDER — OMEPRAZOLE 20 MG PO CPDR: DELAYED_RELEASE_CAPSULE | ORAL | 1 refills | Status: DC

## 2021-03-25 MED ORDER — LINACLOTIDE 145 MCG PO CAPS: 145.0000 ug | ORAL_CAPSULE | Freq: Every day | ORAL | 1 refills | Status: DC

## 2021-03-25 NOTE — Progress Notes (Signed)
[facility] [place], [place] [address]  Internal MEDICINE  Office Visit Note  Patient Name: Sherry Evans  [id number]  [id number]  Date of Service: 03/25/2021  Chief Complaint  Patient presents with   Follow-up    Review labs     HPI Sherry Evans presents for a follow up visit to review lab results and for medication refills. Her TSH, free T4, folate, iron studies, and ferritin levels were normal. Her CBC was also normal. CMP was normal. Lipid panel is normal except for an elevated LDL level of 109. B12 level is high but should not cause any problems.   Current Medication: Outpatient Encounter Medications as of 03/25/2021  Medication Sig   cetirizine (ZYRTEC) 10 MG tablet Take 10 mg by mouth daily.   Cholecalciferol 50 MCG (2000 UT) TABS Take 2,000 Units by mouth daily.   Multiple Vitamins-Calcium (VIACTIV MULTI-VITAMIN) CHEW Chew 1 tablet by mouth daily.   nystatin-triamcinolone ointment (MYCOLOG) Apply 1 application topically 2 (two) times daily as needed.   Probiotic Product (PROBIOTIC-10 PO) Take by mouth.   [DISCONTINUED] gabapentin (NEURONTIN) 600 MG tablet TAKE 2 TABLETS BY MOUTH AT EVENING MEAL   [DISCONTINUED] ketoconazole (NIZORAL) 2 % cream Apply 1 application topically daily.   [DISCONTINUED] levothyroxine (SYNTHROID) 100 MCG tablet Take 1 tablet (100 mcg total) by mouth daily.   [DISCONTINUED] linaclotide (LINZESS) 145 MCG CAPS capsule Take 1 capsule (145 mcg total) by mouth daily before breakfast.   [DISCONTINUED] montelukast (SINGULAIR) 10 MG tablet TAKE 1 TABLET BY MOUTH DAILY FOR ALLERGIES   [DISCONTINUED] omeprazole (PRILOSEC) 20 MG capsule TAKE 2 CAPSULES (40 MG TOTAL) BY MOUTH DAILY FOR 30 DAYS.   [DISCONTINUED] omeprazole (PRILOSEC) 40 MG capsule Take 1 capsule (40 mg total) by mouth daily.   [DISCONTINUED] predniSONE (DELTASONE) 10 MG tablet Take one tab po tid today and bid tomorrow and just one tab on 3 rd  Day to finish   [DISCONTINUED]  sertraline (ZOLOFT) 50 MG tablet Take 1 tablet (50 mg total) by mouth daily.   [DISCONTINUED] traZODone (DESYREL) 50 MG tablet Take 1 tablet (50 mg total) by mouth at bedtime as needed.   gabapentin (NEURONTIN) 600 MG tablet TAKE 2 TABLETS BY MOUTH AT EVENING MEAL   ketoconazole (NIZORAL) 2 % cream Apply 1 application topically daily.   levothyroxine (SYNTHROID) 100 MCG tablet Take 1 tablet (100 mcg total) by mouth daily.   linaclotide (LINZESS) 145 MCG CAPS capsule Take 1 capsule (145 mcg total) by mouth daily before breakfast.   meloxicam (MOBIC) 15 MG tablet Take 1 tablet (15 mg total) by mouth daily.   montelukast (SINGULAIR) 10 MG tablet TAKE 1 TABLET BY MOUTH DAILY FOR ALLERGIES   omeprazole (PRILOSEC) 20 MG capsule TAKE 2 CAPSULES (40 MG TOTAL) BY MOUTH DAILY   sertraline (ZOLOFT) 50 MG tablet Take 1 tablet (50 mg total) by mouth daily.   traZODone (DESYREL) 50 MG tablet Take 1 tablet (50 mg total) by mouth at bedtime as needed.   [DISCONTINUED] levofloxacin (LEVAQUIN) 500 MG tablet Take 1 tablet (500 mg total) by mouth daily. (Patient not taking: Reported on 03/25/2021)   [DISCONTINUED] meloxicam (MOBIC) 15 MG tablet Take 15 mg by mouth daily.   No facility-administered encounter medications on file as of 03/25/2021.    Surgical History: Past Surgical History:  Procedure Laterality Date   adnoids removed  1969   BACK SURGERY  2008   BICEPT TENODESIS Right 04/02/2016   Procedure: BICEPS TENODESIS;  Surgeon:  Sherry Mull, MD;  Location: [facility] ORS;  Service: Orthopedics;  Laterality: Right;   BLADDER SUSPENSION  2009   BREAST BIOPSY Left 1990's   benign   BREAST BIOPSY Left October 28, 2015   SMALL FRAGMENTS OF LYMPHOID TISSUE   CHOLECYSTECTOMY     COLONOSCOPY     DILATION AND CURETTAGE OF UTERUS     GASTRIC RESTRICTION SURGERY  08/23/15   HERNIA REPAIR  2016   hiatal   JOINT REPLACEMENT Bilateral 2014   Partial Knee Replacement,  Surgeon: Dr. Little Evans, Sherry Evans   KNEE  SURGERY  2014   SHOULDER ARTHROSCOPY WITH DEBRIDEMENT AND BICEP TENDON REPAIR Right 04/02/2016   Procedure: SHOULDER ARTHROSCOPY WITH DEBRIDEMENT ;  Surgeon: Sherry Mull, MD;  Location: [facility] ORS;  Service: Orthopedics;  Laterality: Right;   SHOULDER ARTHROSCOPY WITH OPEN ROTATOR CUFF REPAIR Right 04/02/2016   Procedure: SHOULDER ARTHROSCOPY WITH OPEN ROTATOR CUFF REPAIR;  Surgeon: Sherry Mull, MD;  Location: [facility] OR[facility];  Service: Orthopedics;  Laterality: Right;   SHOULDER ARTHROSCOPY WITH SUBACROMIAL DECOMPRESSION Right 04/02/2016   Procedure: SHOULDER ARTHROSCOPY WITH SUBACROMIAL DECOMPRESSION;  Surgeon: Sherry Mull, MD;  Location: [facility];  Service: Orthopedics;  Laterality: Right;    Medical History: Past Medical History:  Diagnosis Date   Anxiety    Depression    GERD (gastroesophageal reflux disease)    History of sinus surgery    3 times   Hypothyroidism    Sleep apnea    use C-PAP   Trigger finger     Family History: Family History  Problem Relation Age of Onset   Hypertension Father    Diabetes Father    Breast cancer Maternal Aunt        great aunts. 35-80   Breast cancer Maternal Grandmother 70       metatastic to breast   Ovarian cancer Maternal Grandmother    Breast cancer Paternal Grandmother        23's   Colon cancer Neg Hx     Social History   Socioeconomic History   Marital status: Married    Spouse name: Not on file   Number of children: Not on file   Years of education: Not on file   Highest education level: Not on file  Occupational History   Not on file  Tobacco Use   Smoking status: Never   Smokeless tobacco: Never  Vaping Use   Vaping Use: Never used  Substance and Sexual Activity   Alcohol use: No    Alcohol/week: 0.0 standard drinks   Drug use: No   Sexual activity: Yes    Birth control/protection: Post-menopausal  Other Topics Concern   Not on file  Social History Narrative   Not on file   Social Determinants of Health    Financial Resource Strain: Not on file  Food Insecurity: Not on file  Transportation Needs: Not on file  Physical Activity: Not on file  Stress: Not on file  Social Connections: Not on file  Intimate Partner Violence: Not on file      Review of Systems  Constitutional:  Negative for chills, fatigue and unexpected weight change.  HENT:  Negative for congestion, rhinorrhea, sneezing and sore throat.   Eyes:  Negative for redness.  Respiratory:  Negative for cough, chest tightness and shortness of breath.   Cardiovascular:  Negative for chest pain and palpitations.  Gastrointestinal:  Negative for abdominal pain, constipation, diarrhea, nausea and vomiting.  Genitourinary:  Negative for dysuria and frequency.  Musculoskeletal:  Negative for arthralgias, back pain, joint swelling and neck pain.  Skin:  Negative for rash.  Neurological: Negative.  Negative for tremors and numbness.  Hematological:  Negative for adenopathy. Does not bruise/bleed easily.  Psychiatric/Behavioral:  Negative for behavioral problems (Depression), sleep disturbance and suicidal ideas. The patient is not nervous/anxious.    Vital Signs: BP 123/75   Pulse 84   Temp 98.3 F (36.8 C)   Resp 16   Ht 5\' 4"  (1.626 m)   Wt 208 lb 9.6 oz (94.6 kg)   LMP 01/03/2016   SpO2 94%   BMI 35.81 kg/m    Physical Exam Vitals reviewed.  Constitutional:      General: She is not in acute distress.    Appearance: Normal appearance. She is well-developed. She is not ill-appearing or diaphoretic.  HENT:     Head: Normocephalic and atraumatic.  Neck:     Thyroid: No thyromegaly.     Vascular: No JVD.     Trachea: No tracheal deviation.  Cardiovascular:     Rate and Rhythm: Normal rate and regular rhythm.     Heart sounds: Normal heart sounds. No murmur heard.   No friction rub. No gallop.  Pulmonary:     Effort: Pulmonary effort is normal. No respiratory distress.     Breath sounds: No wheezing or rales.   Chest:     Chest wall: No tenderness.  Abdominal:     General: Bowel sounds are normal.     Palpations: Abdomen is soft.  Skin:    General: Skin is warm and dry.  Neurological:     Mental Status: She is alert and oriented to person, place, and time.     Cranial Nerves: No cranial nerve deficit.  Psychiatric:        Mood and Affect: Mood normal.        Behavior: Behavior normal.    Assessment/Plan: 1. Depression, major, single episode, moderate (H[facility]) Stable, refills ordered.  - traZODone (DESYREL) 50 MG tablet; Take 1 tablet (50 mg total) by mouth at bedtime as needed.  Dispense: 90 tablet; Refill: 1 - sertraline (ZOLOFT) 50 MG tablet; Take 1 tablet (50 mg total) by mouth daily.  Dispense: 90 tablet; Refill: 1  2. Acquired hypothyroidism Stable, refill ordered.  - levothyroxine (SYNTHROID) 100 MCG tablet; Take 1 tablet (100 mcg total) by mouth daily.  Dispense: 90 tablet; Refill: 2  3. Arthralgia, unspecified joint Stable, manageable, refill ordered - gabapentin (NEURONTIN) 600 MG tablet; TAKE 2 TABLETS BY MOUTH AT EVENING MEAL  Dispense: 180 tablet; Refill: 1 - meloxicam (MOBIC) 15 MG tablet; Take 1 tablet (15 mg total) by mouth daily.  Dispense: 30 tablet; Refill: 5  4. Rash and nonspecific skin eruption Refill ordered - ketoconazole (NIZORAL) 2 % cream; Apply 1 application topically daily.  Dispense: 15 g; Refill: 0  5. Irritable bowel syndrome with constipation Stable refills ordered, samples provided in office  - linaclotide (LINZESS) 145 MCG CAPS capsule; Take 1 capsule (145 mcg total) by mouth daily before breakfast.  Dispense: 90 capsule; Refill: 1  6. Chronic allergic rhinitis Stable, refills ordered. - omeprazole (PRILOSEC) 20 MG capsule; TAKE 2 CAPSULES (40 MG TOTAL) BY MOUTH DAILY  Dispense: 90 capsule; Refill: 1 - montelukast (SINGULAIR) 10 MG tablet; TAKE 1 TABLET BY MOUTH DAILY FOR ALLERGIES  Dispense: 90 tablet; Refill: 1   General Counseling: Keashia  verbalizes understanding of the findings of todays visit and  agrees with plan of treatment. I have discussed any further diagnostic evaluation that may be needed or ordered today. We also reviewed her medications today. she has been encouraged to call the office with any questions or concerns that should arise related to todays visit.    No orders of the defined types were placed in this encounter.   Meds ordered this encounter  Medications   omeprazole (PRILOSEC) 20 MG capsule    Sig: TAKE 2 CAPSULES (40 MG TOTAL) BY MOUTH DAILY    Dispense:  90 capsule    Refill:  1   ketoconazole (NIZORAL) 2 % cream    Sig: Apply 1 application topically daily.    Dispense:  15 g    Refill:  0   levothyroxine (SYNTHROID) 100 MCG tablet    Sig: Take 1 tablet (100 mcg total) by mouth daily.    Dispense:  90 tablet    Refill:  2   linaclotide (LINZESS) 145 MCG CAPS capsule    Sig: Take 1 capsule (145 mcg total) by mouth daily before breakfast.    Dispense:  90 capsule    Refill:  1   traZODone (DESYREL) 50 MG tablet    Sig: Take 1 tablet (50 mg total) by mouth at bedtime as needed.    Dispense:  90 tablet    Refill:  1   sertraline (ZOLOFT) 50 MG tablet    Sig: Take 1 tablet (50 mg total) by mouth daily.    Dispense:  90 tablet    Refill:  1   gabapentin (NEURONTIN) 600 MG tablet    Sig: TAKE 2 TABLETS BY MOUTH AT EVENING MEAL    Dispense:  180 tablet    Refill:  1   montelukast (SINGULAIR) 10 MG tablet    Sig: TAKE 1 TABLET BY MOUTH DAILY FOR ALLERGIES    Dispense:  90 tablet    Refill:  1   meloxicam (MOBIC) 15 MG tablet    Sig: Take 1 tablet (15 mg total) by mouth daily.    Dispense:  30 tablet    Refill:  5    Return in about 8 months (around 11/23/2021) for CPE, Eryn Marandola PCP.   Total time spent:30 Minutes Time spent includes review of chart, medications, test results, and follow up plan with the patient.   [place] Controlled Substance Database was reviewed by me.  This patient was  seen by Jonetta Osgood, FNP-C in collaboration with Dr. Clayborn Bigness as a part of collaborative care agreement.   Jerrit Horen R. Valetta Fuller, MSN, FNP-C Internal medicine

## 2021-04-25 ENCOUNTER — Other Ambulatory Visit: Payer: Self-pay

## 2021-04-25 ENCOUNTER — Ambulatory Visit
Admission: RE | Admit: 2021-04-25 | Discharge: 2021-04-25 | Disposition: A | Discharge status: Home / Self Care | Payer: BC Managed Care – PPO | Source: Ambulatory Visit | Attending: Obstetrics and Gynecology | Admitting: Obstetrics and Gynecology

## 2021-04-25 DIAGNOSIS — Z1231 Encounter for screening mammogram for malignant neoplasm of breast: Secondary | ICD-10-CM | POA: Insufficient documentation

## 2021-06-15 ENCOUNTER — Encounter: Payer: Self-pay | Admitting: Internal Medicine

## 2021-06-15 ENCOUNTER — Other Ambulatory Visit: Payer: Self-pay | Admitting: Nurse Practitioner

## 2021-06-15 DIAGNOSIS — S0990XA Unspecified injury of head, initial encounter: Secondary | ICD-10-CM

## 2021-06-15 NOTE — Progress Notes (Signed)
Patient fell and hit her head yesterday. Initially had an intense headache that improved but did not go away completely. Neck and back are sore and stiff. She has high pitched ringing in her ears that comes and goes now. No altered mental status. Staff will call tomorrow to make acute office visit.   1. Acute headache due to traumatic injury of head Urgent CT head ordered. Message sent to staff to call patient tomorrow to schedule acute visit with her this week.  - CT HEAD WO CONTRAST (5MM); Future

## 2021-06-16 ENCOUNTER — Telehealth: Payer: Self-pay

## 2021-06-16 ENCOUNTER — Emergency Department: Payer: BC Managed Care – PPO

## 2021-06-16 ENCOUNTER — Other Ambulatory Visit: Payer: Self-pay

## 2021-06-16 DIAGNOSIS — Z79899 Other long term (current) drug therapy: Secondary | ICD-10-CM | POA: Diagnosis not present

## 2021-06-16 DIAGNOSIS — Y92321 Football field as the place of occurrence of the external cause: Secondary | ICD-10-CM | POA: Insufficient documentation

## 2021-06-16 DIAGNOSIS — E039 Hypothyroidism, unspecified: Secondary | ICD-10-CM | POA: Diagnosis not present

## 2021-06-16 DIAGNOSIS — W01198A Fall on same level from slipping, tripping and stumbling with subsequent striking against other object, initial encounter: Secondary | ICD-10-CM | POA: Insufficient documentation

## 2021-06-16 DIAGNOSIS — Z96653 Presence of artificial knee joint, bilateral: Secondary | ICD-10-CM | POA: Insufficient documentation

## 2021-06-16 DIAGNOSIS — S0990XA Unspecified injury of head, initial encounter: Secondary | ICD-10-CM | POA: Diagnosis present

## 2021-06-16 NOTE — ED Triage Notes (Signed)
Pt states she fell on Saturday hitting head on wooden hand rail. No loc, has had headache since. States yesterday started hearing a noise to right ear. Headache is improved but here to be evaluated to head injury.

## 2021-06-16 NOTE — ED Provider Notes (Addendum)
Emergency Medicine Provider Triage Evaluation Note  Sherry Evans , a 59 y.o. female  was evaluated in triage.  Pt complains of ongoing intermittent headache and associated nausea.  Patient reports mechanical fall on Saturday.  Denies any LOC but fell hitting the back of her head.  She denies any distal paresthesias, chest pain, or weakness.  Denies blood thinner use.  Review of Systems  Positive: Headache, nausea Negative: CP, paresthesias  Physical Exam  BP 137/85 (BP Location: Left Arm)   Pulse 66   Temp 98.6 F (37 C) (Oral)   Resp 17   Ht 5\' 4"  (1.626 m)   Wt 95.3 kg   LMP 01/03/2016   SpO2 100%   BMI 36.05 kg/m  Gen:   Awake, no distress  NAD Resp:  Normal effort CTA MSK:   Moves extremities without difficulty  Other:  CVS: RRR  Medical Decision Making  Medically screening exam initiated at 7:11 PM.  Appropriate orders placed.  MAXX CALAWAY was informed that the remainder of the evaluation will be completed by another provider, this initial triage assessment does not replace that evaluation, and the importance of remaining in the ED until their evaluation is complete.  Patient ED evaluation of ongoing intermittent headache and nausea following a mechanical fall.   Melvenia Needles, PA-C 06/16/21 1912    Melvenia Needles, PA-C 06/16/21 Lanetta Inch    Duffy Bruce, MD 06/17/21 [phone]

## 2021-06-17 ENCOUNTER — Emergency Department
Admission: EM | Admit: 2021-06-17 | Discharge: 2021-06-17 | Disposition: A | Discharge status: Home / Self Care | Payer: BC Managed Care – PPO | Attending: Emergency Medicine | Admitting: Emergency Medicine

## 2021-06-17 DIAGNOSIS — S0990XA Unspecified injury of head, initial encounter: Secondary | ICD-10-CM

## 2021-06-17 DIAGNOSIS — W19XXXA Unspecified fall, initial encounter: Secondary | ICD-10-CM

## 2021-06-17 MED ORDER — BUTALBITAL-APAP-CAFFEINE 50-325-40 MG PO TABS
1.0000 | ORAL_TABLET | Freq: Once | ORAL | Status: AC
  Administered 2021-06-17: 1 via ORAL
  Filled 2021-06-17: qty 1

## 2021-06-17 MED ORDER — BUTALBITAL-APAP-CAFFEINE 50-325-40 MG PO TABS: 1.0000 | ORAL_TABLET | Freq: Four times a day (QID) | ORAL | 0 refills | Status: DC | PRN

## 2021-06-17 MED ORDER — ACETAMINOPHEN 325 MG PO TABS
650.0000 mg | ORAL_TABLET | Freq: Once | ORAL | Status: AC
  Administered 2021-06-17: 650 mg via ORAL
  Filled 2021-06-17: qty 2

## 2021-06-17 NOTE — ED Provider Notes (Signed)
[facility] Emergency Department Provider Note  ____________________________________________  Time seen: Approximately 2:45 AM  I have reviewed the triage vital signs and the nursing notes.   HISTORY  Chief Complaint Fall   HPI Sherry Evans is a 60 y.o. female with a history anxiety, depression, GERD, hypothyroidism, sleep apnea who presents for evaluation of a headache.  Patient reports that she sustained a mechanical fall 2 days ago hitting the back of her head [place]  No LOC, no blood thinners.  Since then she has had a constant throbbing headache and also intermittent high pitch ringing.  No changes in vision, no vomiting, no photophobia, no neck stiffness or neck pain, no difficulty sleeping.  She has been taking Tylenol at home with no significant improvement of the pain.  Currently the pain is moderate in intensity.  She just wanted to make sure that she did not sustain a traumatic injury.   Past Medical History:  Diagnosis Date   Anxiety    Depression    GERD (gastroesophageal reflux disease)    History of sinus surgery    3 times   Hypothyroidism    Sleep apnea    use C-PAP   Trigger finger     Patient Active Problem List   Diagnosis Date Noted   Hyperlipidemia, mixed 04/12/2020   Palpitations 03/13/2020   Urinary tract infection with hematuria 02/28/2020   Dysuria 02/28/2020   Cardiac arrhythmia 02/28/2020   Gastroesophageal reflux disease 10/18/2019   Status post bariatric surgery 09/20/2019   Abnormal weight gain 08/23/2019   Encounter for general adult medical examination with abnormal findings 08/20/2018   Irritable bowel syndrome with constipation 08/20/2018   Depression, major, single episode, moderate ([place]) 08/20/2018   Chickenpox 08/16/2018   Environmental allergies 08/16/2018   Menstrual abnormality 08/16/2018   Pain in left hip 02/23/2018   Fatigue 02/23/2018   Raynaud's  phenomenon without gangrene 02/23/2018   Hypothyroidism 10/29/2016   Vaginal atrophy 10/29/2016   Dyspareunia in female 10/29/2016   Obesity (BMI 30.0-34.9) 10/29/2016   Menopause 10/29/2016   Tendinitis of right rotator cuff 03/02/2016   Injury of tendon of long head of right biceps 03/02/2016   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 11/27/2015   Obstructive sleep apnea syndrome 11/27/2015   Morbid obesity ([place]) 08/21/2015   Lumbar radiculitis 08/12/2015   Shortness of breath 07/29/2015   Abnormal ECG 07/15/2015   Anxiety disorder 06/12/2015   Multiple joint pain 06/12/2015   DDD (degenerative disc disease), lumbar 06/19/2014   Knee contusion 02/27/2014    Past Surgical History:  Procedure Laterality Date   adnoids removed  [place]  2008   BICEPT TENODESIS Right 04/02/2016   Procedure: BICEPS TENODESIS;  Surgeon: Corky Mull, MD;  Location: [facility] ORS;  Service: Orthopedics;  Laterality: Right;   BLADDER SUSPENSION  2009   BREAST BIOPSY Left 1990's   benign   BREAST BIOPSY Left October 28, 2015   SMALL FRAGMENTS OF LYMPHOID TISSUE   CHOLECYSTECTOMY     COLONOSCOPY     DILATION AND CURETTAGE OF UTERUS     GASTRIC RESTRICTION SURGERY  08/23/15   HERNIA REPAIR  2016   hiatal   JOINT REPLACEMENT Bilateral 2014   Partial Knee Replacement,  Surgeon: Dr. Little Ishikawa, Brooke Bonito. [facility]   KNEE SURGERY  2014   SHOULDER ARTHROSCOPY WITH DEBRIDEMENT  AND BICEP TENDON REPAIR Right 04/02/2016   Procedure: SHOULDER ARTHROSCOPY WITH DEBRIDEMENT ;  Surgeon: Corky Mull, MD;  Location: [facility] ORS;  Service: Orthopedics;  Laterality: Right;   SHOULDER ARTHROSCOPY WITH OPEN ROTATOR CUFF REPAIR Right 04/02/2016   Procedure: SHOULDER ARTHROSCOPY WITH OPEN ROTATOR CUFF REPAIR;  Surgeon: Corky Mull, MD;  Location: [facility] ORS;  Service: Orthopedics;  Laterality: Right;   SHOULDER ARTHROSCOPY WITH  SUBACROMIAL DECOMPRESSION Right 04/02/2016   Procedure: SHOULDER ARTHROSCOPY WITH SUBACROMIAL DECOMPRESSION;  Surgeon: Corky Mull, MD;  Location: [facility] ORS;  Service: Orthopedics;  Laterality: Right;    Prior to Admission medications   Medication Sig Start Date End Date Taking? Authorizing Provider  butalbital-acetaminophen-caffeine (FIORICET) 50-325-40 MG tablet Take 1 tablet by mouth every 6 (six) hours as needed for headache. 06/17/21 06/17/22 Yes Alfred Levins, [place], MD  cetirizine (ZYRTEC) 10 MG tablet Take 10 mg by mouth daily.    [provider]  Cholecalciferol 50 MCG (2000 UT) TABS Take 2,000 Units by mouth daily.    [provider]  gabapentin (NEURONTIN) 600 MG tablet TAKE 2 TABLETS BY MOUTH AT [facility] MEAL 03/25/21   Jonetta Osgood, NP  ketoconazole (NIZORAL) 2 % cream Apply 1 application topically daily. 03/25/21   Jonetta Osgood, NP  levothyroxine (SYNTHROID) 100 MCG tablet Take 1 tablet (100 mcg total) by mouth daily. 03/25/21   Jonetta Osgood, NP  linaclotide (LINZESS) 145 MCG CAPS capsule Take 1 capsule (145 mcg total) by mouth daily before breakfast. 03/25/21   Jonetta Osgood, NP  meloxicam (MOBIC) 15 MG tablet Take 1 tablet (15 mg total) by mouth daily. 03/25/21   Jonetta Osgood, NP  montelukast (SINGULAIR) 10 MG tablet TAKE 1 TABLET BY MOUTH DAILY FOR ALLERGIES 03/25/21   Jonetta Osgood, NP  Multiple Vitamins-Calcium (VIACTIV MULTI-VITAMIN) CHEW Chew 1 tablet by mouth daily.    [provider]  nystatin-triamcinolone ointment (MYCOLOG) Apply 1 application topically 2 (two) times daily as needed. 10/28/16   Defrancesco, Alanda Slim, MD  omeprazole (PRILOSEC) 20 MG capsule TAKE 2 CAPSULES (40 MG TOTAL) BY MOUTH DAILY 03/25/21   Jonetta Osgood, NP  Probiotic Product (PROBIOTIC-10 PO) Take by mouth.    [provider]  sertraline (ZOLOFT) 50 MG tablet Take 1 tablet (50 mg total) by mouth daily. 03/25/21   Jonetta Osgood, NP  traZODone (DESYREL)  50 MG tablet Take 1 tablet (50 mg total) by mouth at bedtime as needed. 03/25/21   Jonetta Osgood, NP    Allergies Levonorgestrel-ethinyl estrad, Azithromycin, and Sulfa antibiotics  Family History  Problem Relation Age of Onset   Hypertension Father    Diabetes Father    Breast cancer Maternal Aunt        great aunts. 70-80   Breast cancer Maternal Grandmother 70       metatastic to breast   Ovarian cancer Maternal Grandmother    Breast cancer Paternal Grandmother        70's   Colon cancer Neg Hx     Social History Social History   Tobacco Use   Smoking status: Never   Smokeless tobacco: Never  Vaping Use   Vaping Use: Never used  Substance Use Topics   Alcohol use: No    Alcohol/week: 0.0 standard drinks   Drug use: No    Review of Systems  Constitutional: Negative for fever. Eyes: Negative for visual changes. ENT: Negative for facial injury or neck injury Cardiovascular: Negative for chest injury. Respiratory: Negative for shortness of breath. Negative  for chest wall injury. Gastrointestinal: Negative for abdominal pain or injury. Genitourinary: Negative for dysuria. Musculoskeletal: Negative for back injury, negative for arm or leg pain. Skin: Negative for laceration/abrasions. Neurological: + head injury.   ____________________________________________   PHYSICAL EXAM:  VITAL SIGNS: ED Triage Vitals  Enc Vitals Group     BP 06/16/21 1900 137/85     Pulse Rate 06/16/21 1900 72     Resp 06/16/21 1900 18     Temp 06/16/21 1900 98.6 F (37 C)     Temp Source 06/16/21 1900 Oral     SpO2 06/16/21 1900 100 %     Weight 06/16/21 1904 210 lb (95.3 kg)     Height 06/16/21 1904 5\' 4"  (1.626 m)     Head Circumference --      Peak Flow --      Pain Score 06/16/21 1904 4     Pain Loc --      Pain Edu? --      Excl. in [place]? --      Constitutional: Alert and oriented. No acute distress. Does not appear intoxicated. HEENT Head: Normocephalic and  atraumatic. Face: No facial bony tenderness. Stable midface Ears: No hemotympanum bilaterally. No Battle sign Eyes: No eye injury. PERRL. No raccoon eyes Nose: Nontender. No epistaxis. No rhinorrhea Mouth/Throat: Mucous membranes are moist. No oropharyngeal blood. No dental injury. Airway patent without stridor. Normal voice. Neck: no C-collar. No midline c-spine tenderness.  Cardiovascular: Normal rate, regular rhythm. Normal and symmetric distal pulses are present in all extremities. Pulmonary/Chest: Chest wall is stable and nontender to palpation/compression. Normal respiratory effort. Breath sounds are normal. No crepitus.  Abdominal: Soft, nontender, non distended. Musculoskeletal: Nontender with normal full range of motion in all extremities. No deformities. No thoracic or lumbar midline spinal tenderness. Pelvis is stable. Skin: Skin is warm, dry and intact. No abrasions or contutions. Psychiatric: Speech and behavior are appropriate. Neurological: Normal speech and language. Moves all extremities to command. No gross focal neurologic deficits are appreciated.  Glascow Coma Score: 4 - Opens eyes on own 6 - Follows simple motor commands 5 - Alert and oriented GCS: 15   ____________________________________________   LABS (all labs ordered are listed, but only abnormal results are displayed)  Labs Reviewed - No data to display ____________________________________________  EKG  none  ____________________________________________  RADIOLOGY  I have personally reviewed the images performed during this visit and I agree with the Radiologist's read.   Interpretation by Radiologist:  CT HEAD WO CONTRAST (5MM)  Result Date: 06/16/2021 CLINICAL DATA:  [place] 2 days ago, hit head EXAM: CT HEAD WITHOUT CONTRAST TECHNIQUE: Contiguous axial images were obtained from the base of the skull through the vertex without intravenous contrast. COMPARISON:  None. FINDINGS: Brain: No acute  infarct or hemorrhage. Lateral ventricles and midline structures are unremarkable. No acute extra-axial fluid collections. No mass effect. Vascular: No hyperdense vessel or unexpected calcification. Skull: Normal. Negative for fracture or focal lesion. Sinuses/Orbits: Postsurgical changes from bilateral ethmoidectomies. Mucosal thickening a gas fluid level within the left sphenoid sinus. Other: None. IMPRESSION: 1. No acute intracranial process. Electronically Signed   By: Randa Ngo M.D.   On: 06/16/2021 20:22     ____________________________________________   PROCEDURES  Procedure(s) performed: None Procedures Critical Care performed:  None ____________________________________________   INITIAL IMPRESSION / ASSESSMENT AND PLAN / ED COURSE  60 y.o. female with a history anxiety, depression, GERD, hypothyroidism, sleep apnea who presents for evaluation of a post-traumatic headache.  Patient is well-appearing in no distress with normal neurological exam, head is atraumatic, no spine tenderness, no signs of basilar skull fracture, TMs are visualized and clear with no signs of hemotympanum.  Possible mild concussion versus posttraumatic headache.  Will prescribe a short course of Fioricet, recommended rest and increase hydration.  Will give 2 days off work and if symptoms persist recommend following up with primary care doctor.  Discussed my standard return precautions       ____________________________________________  Please note:  Patient was evaluated in Emergency Department today for the symptoms described in the history of present illness. Patient was evaluated in the context of the global COVID-19 pandemic, which necessitated consideration that the patient might be at risk for infection with the SARS-CoV-2 virus that causes COVID-19. Institutional protocols and algorithms that pertain to the evaluation of patients at risk for COVID-19 are in a state of rapid change based on information  released by regulatory bodies including the CDC and federal and state organizations. These policies and algorithms were followed during the patient's care in the ED.  Some ED evaluations and interventions may be delayed as a result of limited staffing during the pandemic.   ____________________________________________   FINAL CLINICAL IMPRESSION(S) / ED DIAGNOSES   Final diagnoses:  Fall, initial encounter  Minor head injury, initial encounter      NEW MEDICATIONS STARTED DURING THIS VISIT:  ED Discharge Orders          Ordered    butalbital-acetaminophen-caffeine (FIORICET) 50-325-40 MG tablet  Every 6 hours PRN        06/17/21 0243             Note:  This document was prepared using Dragon voice recognition software and may include unintentional dictation errors.    Alfred Levins, [place], MD 06/17/21 [phone]

## 2021-06-17 NOTE — Discharge Instructions (Addendum)
Rest, hydrate, take the medication as prescribed for the headache. Follow up with your doctor in 2 days. Return to the ER for severe headache, vomiting, dizziness, changes in vision.

## 2021-06-17 NOTE — ED Notes (Signed)
Patient discharged to home per MD order. Patient in stable condition, and deemed medically cleared by ED provider for discharge. Discharge instructions reviewed with patient/family using "Teach Back"; verbalized understanding of medication education and administration, and information about follow-up care. Denies further concerns. ° °

## 2021-06-18 ENCOUNTER — Telehealth: Payer: Self-pay

## 2021-06-18 NOTE — Telephone Encounter (Signed)
Canceled urgent head CT due to the patient already having it done at the hospital.

## 2021-06-19 ENCOUNTER — Encounter: Payer: Self-pay | Admitting: Nurse Practitioner

## 2021-06-19 ENCOUNTER — Other Ambulatory Visit: Payer: Self-pay

## 2021-06-19 ENCOUNTER — Ambulatory Visit (INDEPENDENT_AMBULATORY_CARE_PROVIDER_SITE_OTHER): Payer: BC Managed Care – PPO | Admitting: Nurse Practitioner

## 2021-06-19 DIAGNOSIS — I950 Idiopathic hypotension: Secondary | ICD-10-CM

## 2021-06-19 DIAGNOSIS — S060X0S Concussion without loss of consciousness, sequela: Secondary | ICD-10-CM

## 2021-06-19 DIAGNOSIS — S0990XA Unspecified injury of head, initial encounter: Secondary | ICD-10-CM

## 2021-06-19 NOTE — Progress Notes (Signed)
[facility] [place], [place] [address]  Internal MEDICINE  Office Visit Note  Patient Name: Sherry Evans  [id number]  [id number]  Date of Service: 07/07/2021  Chief Complaint  Patient presents with   Acute Visit    Pt fell last Saturday morning, hit head and has a concussion      HPI Sherry Evans presents for an acute sick visit for after sustaining a fall on 06/14/21 and hitting her head. She did not initially go to ER but then decided to go 2 days later on 9/26. It was a mechanical fall and she [place]  No LOC, no blood thinners.  Since then she has had a constant throbbing headache and also intermittent high pitch ringing.  No changes in vision, no vomiting, no photophobia, no neck stiffness or neck pain, no difficulty sleeping.  She has been taking Tylenol at home with no significant improvement of the pain.  Currently the pain is moderate in intensity.  She just wanted to make sure that she did not sustain a traumatic injury.  CT head was done in ER and it was negative for any acute abnormalities. She was diagnosed with a mild concussion and given a prescription for fioricet for her headaches.  She has been out of work this week and has a Librarian, academic who has personally experienced a concussion or mild TBI so she is very understanding of the patient's situation. The patient is a Pharmacist, hospital and works at a school. She is asking if she can go back to work on next Monday, 06/23/21.   Blood pressure is low today     Current Medication:  Outpatient Encounter Medications as of 06/19/2021  Medication Sig   butalbital-acetaminophen-caffeine (FIORICET) 50-325-40 MG tablet Take 1 tablet by mouth every 6 (six) hours as needed for headache.   cetirizine (ZYRTEC) 10 MG tablet Take 10 mg by mouth daily.   Cholecalciferol 50 MCG (2000 UT) TABS Take 2,000 Units by mouth daily.   gabapentin (NEURONTIN) 600 MG tablet TAKE  2 TABLETS BY MOUTH AT EVENING MEAL   ketoconazole (NIZORAL) 2 % cream Apply 1 application topically daily.   levothyroxine (SYNTHROID) 100 MCG tablet Take 1 tablet (100 mcg total) by mouth daily.   linaclotide (LINZESS) 145 MCG CAPS capsule Take 1 capsule (145 mcg total) by mouth daily before breakfast.   meloxicam (MOBIC) 15 MG tablet Take 1 tablet (15 mg total) by mouth daily.   montelukast (SINGULAIR) 10 MG tablet TAKE 1 TABLET BY MOUTH DAILY FOR ALLERGIES   Multiple Vitamins-Calcium (VIACTIV MULTI-VITAMIN) CHEW Chew 1 tablet by mouth daily.   nystatin-triamcinolone ointment (MYCOLOG) Apply 1 application topically 2 (two) times daily as needed.   omeprazole (PRILOSEC) 20 MG capsule TAKE 2 CAPSULES (40 MG TOTAL) BY MOUTH DAILY   Probiotic Product (PROBIOTIC-10 PO) Take by mouth.   sertraline (ZOLOFT) 50 MG tablet Take 1 tablet (50 mg total) by mouth daily.   traZODone (DESYREL) 50 MG tablet Take 1 tablet (50 mg total) by mouth at bedtime as needed.   No facility-administered encounter medications on file as of 06/19/2021.      Medical History: Past Medical History:  Diagnosis Date   Anxiety    Depression    GERD (gastroesophageal reflux disease)    History of sinus surgery    3 times   Hypothyroidism    Sleep apnea    use C-PAP  Trigger finger      Vital Signs: BP (!) 115/57   Pulse 70   Temp 98.5 F (36.9 C)   Resp 16   Ht 5\' 4"  (1.626 m)   Wt 212 lb 3.2 oz (96.3 kg)   LMP 01/03/2016   SpO2 98%   BMI 36.42 kg/m    Review of Systems  Constitutional:  Negative for chills, fatigue and unexpected weight change.  HENT:  Negative for congestion, rhinorrhea, sneezing and sore throat.   Eyes:  Negative for redness.  Respiratory:  Negative for cough, chest tightness and shortness of breath.   Cardiovascular:  Negative for chest pain and palpitations.  Gastrointestinal:  Negative for abdominal pain, constipation, diarrhea, nausea and vomiting.  Genitourinary:  Negative  for dysuria and frequency.  Musculoskeletal:  Negative for arthralgias, back pain, joint swelling and neck pain.  Skin:  Negative for rash.  Neurological:  Positive for headaches. Negative for tremors and numbness.  Hematological:  Negative for adenopathy. Does not bruise/bleed easily.  Psychiatric/Behavioral:  Negative for behavioral problems (Depression), sleep disturbance and suicidal ideas. The patient is not nervous/anxious.    Physical Exam Vitals reviewed.  Constitutional:      General: She is not in acute distress.    Appearance: Normal appearance. She is obese. She is not ill-appearing.  HENT:     Head: Normocephalic and atraumatic.  Eyes:     Extraocular Movements: Extraocular movements intact.     Pupils: Pupils are equal, round, and reactive to light.  Cardiovascular:     Rate and Rhythm: Normal rate and regular rhythm.  Pulmonary:     Effort: Pulmonary effort is normal. No respiratory distress.  Neurological:     Mental Status: She is alert and oriented to person, place, and time.     Cranial Nerves: No cranial nerve deficit.     Coordination: Coordination normal.     Gait: Gait normal.  Psychiatric:        Mood and Affect: Mood normal.        Behavior: Behavior normal.      Assessment/Plan: . Idiopathic hypotension Pt is instructed to hydrate and monitor blood pressure  . Concussion without loss of consciousness, sequela [facility]) May return to work on Monday 06/23/21 if symptoms continue to improve or resolve completely. Patient encouraged to call the clinic if symptoms worsen. Patient also understands under what circumstances she should return to the ER.  Marland Kitchen Acute headache due to traumatic injury of head Continue fioricet as needed.    General Counseling: Sherry Evans verbalizes understanding of the findings of todays visit and agrees with plan of treatment. I have discussed any further diagnostic evaluation that may be needed or ordered today. We also reviewed her  medications today. she has been encouraged to call the office with any questions or concerns that should arise related to todays visit.    Counseling:    No orders of the defined types were placed in this encounter.   No orders of the defined types were placed in this encounter.   Return if symptoms worsen or fail to improve.  [place] Controlled Substance Database was reviewed by me for overdose risk score (ORS)  Time spent:20 Minutes Time spent with patient included reviewing progress notes, labs, imaging studies, and discussing plan for follow up.   This patient was seen by Jonetta Osgood, FNP-C in collaboration with Dr. Clayborn Bigness as a part of collaborative care agreement.  Halley Shepheard R. Valetta Fuller, MSN, FNP-C Internal Medicine

## 2021-06-26 ENCOUNTER — Other Ambulatory Visit: Payer: Self-pay | Admitting: Physical Medicine and Rehabilitation

## 2021-06-26 DIAGNOSIS — M5416 Radiculopathy, lumbar region: Secondary | ICD-10-CM

## 2021-07-08 ENCOUNTER — Ambulatory Visit: Payer: BC Managed Care – PPO

## 2021-07-08 ENCOUNTER — Ambulatory Visit
Admission: RE | Admit: 2021-07-08 | Discharge: 2021-07-08 | Disposition: A | Discharge status: Home / Self Care | Payer: BC Managed Care – PPO | Source: Ambulatory Visit | Attending: Physical Medicine and Rehabilitation | Admitting: Physical Medicine and Rehabilitation

## 2021-07-08 ENCOUNTER — Other Ambulatory Visit: Payer: Self-pay

## 2021-07-08 DIAGNOSIS — M5416 Radiculopathy, lumbar region: Secondary | ICD-10-CM | POA: Diagnosis not present

## 2021-07-08 NOTE — Telephone Encounter (Signed)
error 

## 2021-08-21 HISTORY — PX: TENDON RELEASE: SHX230

## 2021-09-10 ENCOUNTER — Encounter: Payer: Self-pay | Admitting: Nurse Practitioner

## 2021-09-10 ENCOUNTER — Encounter: Payer: Self-pay | Admitting: Internal Medicine

## 2021-09-10 ENCOUNTER — Telehealth (INDEPENDENT_AMBULATORY_CARE_PROVIDER_SITE_OTHER): Payer: BC Managed Care – PPO | Admitting: Nurse Practitioner

## 2021-09-10 VITALS — Temp 101.0°F | Ht 64.0 in | Wt 200.0 lb

## 2021-09-10 DIAGNOSIS — J0110 Acute frontal sinusitis, unspecified: Secondary | ICD-10-CM

## 2021-09-10 DIAGNOSIS — R051 Acute cough: Secondary | ICD-10-CM | POA: Diagnosis not present

## 2021-09-10 MED ORDER — AMOXICILLIN-POT CLAVULANATE 875-125 MG PO TABS
1.0000 | ORAL_TABLET | Freq: Two times a day (BID) | ORAL | 0 refills | Status: DC
Start: 2021-09-10 — End: 2021-11-26

## 2021-09-10 NOTE — Telephone Encounter (Signed)
Spoke with pt and make appt today

## 2021-09-10 NOTE — Progress Notes (Signed)
[facility], [place] [address]  Internal MEDICINE  Telephone Visit  Patient Name: Sherry Evans  [id number]  [id number]  Date of Service: 09/10/2021  I connected with the patient at 3:10 PM by telephone and verified the patients identity using two identifiers.   I discussed the limitations, risks, security and privacy concerns of performing an evaluation and management service by telephone and the availability of in person appointments. I also discussed with the patient that there may be a patient responsible charge related to the service.  The patient expressed understanding and agrees to proceed.    Chief Complaint  Patient presents with   Telephone Assessment    [id number]   Telephone Screen   Sinusitis   Laryngitis    HPI Sherry Evans presents for a telehealth virtual visit for symptoms of sinusitis and laryngitis. Her voice is hoarse. She reports sore throat, nasal congestion, sinus pressure, runny nose, and cough. She has been taking mucinex DM and her regular medications. She has not tested for covid. She was encouraged to test for covid and call the clinic if it is positive so she can get antiviral treatment.      Current Medication: Outpatient Encounter Medications as of 09/10/2021  Medication Sig   amoxicillin-clavulanate (AUGMENTIN) 875-125 MG tablet Take 1 tablet by mouth 2 (two) times daily.   butalbital-acetaminophen-caffeine (FIORICET) 50-325-40 MG tablet Take 1 tablet by mouth every 6 (six) hours as needed for headache.   cetirizine (ZYRTEC) 10 MG tablet Take 10 mg by mouth daily.   Cholecalciferol 50 MCG (2000 UT) TABS Take 2,000 Units by mouth daily.   gabapentin (NEURONTIN) 600 MG tablet TAKE 2 TABLETS BY MOUTH AT EVENING MEAL   ketoconazole (NIZORAL) 2 % cream Apply 1 application topically daily.   levothyroxine (SYNTHROID) 100 MCG tablet Take 1 tablet (100 mcg total) by mouth daily.   linaclotide (LINZESS) 145 MCG CAPS capsule  Take 1 capsule (145 mcg total) by mouth daily before breakfast.   meloxicam (MOBIC) 15 MG tablet Take 1 tablet (15 mg total) by mouth daily.   montelukast (SINGULAIR) 10 MG tablet TAKE 1 TABLET BY MOUTH DAILY FOR ALLERGIES   Multiple Vitamins-Calcium (VIACTIV MULTI-VITAMIN) CHEW Chew 1 tablet by mouth daily.   nystatin-triamcinolone ointment (MYCOLOG) Apply 1 application topically 2 (two) times daily as needed.   omeprazole (PRILOSEC) 20 MG capsule TAKE 2 CAPSULES (40 MG TOTAL) BY MOUTH DAILY   Probiotic Product (PROBIOTIC-10 PO) Take by mouth.   sertraline (ZOLOFT) 50 MG tablet Take 1 tablet (50 mg total) by mouth daily.   traZODone (DESYREL) 50 MG tablet Take 1 tablet (50 mg total) by mouth at bedtime as needed.   No facility-administered encounter medications on file as of 09/10/2021.    Surgical History: Past Surgical History:  Procedure Laterality Date   adnoids removed  1969   BACK SURGERY  2008   BICEPT TENODESIS Right 04/02/2016   Procedure: BICEPS TENODESIS;  Surgeon: Corky Mull, MD;  Location: [facility] ORS;  Service: Orthopedics;  Laterality: Right;   BLADDER SUSPENSION  2009   BREAST BIOPSY Left 1990's   benign   BREAST BIOPSY Left October 28, 2015   SMALL FRAGMENTS OF LYMPHOID TISSUE   CHOLECYSTECTOMY     COLONOSCOPY     DILATION AND CURETTAGE OF UTERUS     GASTRIC RESTRICTION SURGERY  08/23/15   HERNIA REPAIR  2016   hiatal   JOINT REPLACEMENT Bilateral 2014   Partial Knee Replacement,  Surgeon: Dr. Little Ishikawa, Brooke Bonito[facility]   KNEE SURGERY  2014   SHOULDER ARTHROSCOPY WITH DEBRIDEMENT AND BICEP TENDON REPAIR Right 04/02/2016   Procedure: SHOULDER ARTHROSCOPY WITH DEBRIDEMENT ;  Surgeon: Corky Mull, MD;  Location: [facility] ORS;  Service: Orthopedics;  Laterality: Right;   SHOULDER ARTHROSCOPY WITH OPEN ROTATOR CUFF REPAIR Right 04/02/2016   Procedure: SHOULDER ARTHROSCOPY WITH OPEN ROTATOR CUFF REPAIR;  Surgeon: Corky Mull, MD;  Location: [facility] OR[facility];  Service:  Orthopedics;  Laterality: Right;   SHOULDER ARTHROSCOPY WITH SUBACROMIAL DECOMPRESSION Right 04/02/2016   Procedure: SHOULDER ARTHROSCOPY WITH SUBACROMIAL DECOMPRESSION;  Surgeon: Corky Mull, MD;  Location: [facility] ORS;  Service: Orthopedics;  Laterality: Right;    Medical History: Past Medical History:  Diagnosis Date   Anxiety    Depression    GERD (gastroesophageal reflux disease)    History of sinus surgery    3 times   Hypothyroidism    Sleep apnea    use C-PAP   Trigger finger     Family History: Family History  Problem Relation Age of Onset   Hypertension Father    Diabetes Father    Breast cancer Maternal Aunt        great aunts. 70-80   Breast cancer Maternal Grandmother 70       metatastic to breast   Ovarian cancer Maternal Grandmother    Breast cancer Paternal Grandmother        9's   Colon cancer Neg Hx     Social History   Socioeconomic History   Marital status: Married    Spouse name: Not on file   Number of children: Not on file   Years of education: Not on file   Highest education level: Not on file  Occupational History   Not on file  Tobacco Use   Smoking status: Never   Smokeless tobacco: Never  Vaping Use   Vaping Use: Never used  Substance and Sexual Activity   Alcohol use: No    Alcohol/week: 0.0 standard drinks   Drug use: No   Sexual activity: Yes    Birth control/protection: Post-menopausal  Other Topics Concern   Not on file  Social History Narrative   Not on file   Social Determinants of Health   Financial Resource Strain: Not on file  Food Insecurity: Not on file  Transportation Needs: Not on file  Physical Activity: Not on file  Stress: Not on file  Social Connections: Not on file  Intimate Partner Violence: Not on file      Review of Systems  Constitutional:  Positive for fever. Negative for chills and fatigue.  HENT:  Positive for congestion, postnasal drip, rhinorrhea, sinus pressure, sinus pain, sneezing, sore  throat, trouble swallowing and voice change. Negative for ear pain.   Eyes:  Negative for pain.  Respiratory:  Negative for cough, chest tightness, shortness of breath and wheezing.   Cardiovascular: Negative.  Negative for chest pain and palpitations.  Neurological:  Negative for dizziness, light-headedness and headaches.   Vital Signs: Temp (!) 101 F (38.3 C)    Ht 5\' 4"  (1.626 m)    Wt 200 lb (90.7 kg)    LMP 01/03/2016    BMI 34.33 kg/m    Observation/Objective: She is alert and oriented and engages in conversation appropriately. She does not appear to be in any acute distress on video call.     Assessment/Plan: 1. Acute non-recurrent frontal sinusitis Empiric antibiotic treatment prescribed.  -  amoxicillin-clavulanate (AUGMENTIN) 875-125 MG tablet; Take 1 tablet by mouth 2 (two) times daily.  Dispense: 20 tablet; Refill: 0  2. Acute cough Continue OTC mucinex DM as needed.    General Counseling: Guyla verbalizes understanding of the findings of today's phone visit and agrees with plan of treatment. I have discussed any further diagnostic evaluation that may be needed or ordered today. We also reviewed her medications today. she has been encouraged to call the office with any questions or concerns that should arise related to todays visit.  Return if symptoms worsen or fail to improve.   No orders of the defined types were placed in this encounter.   Meds ordered this encounter  Medications   amoxicillin-clavulanate (AUGMENTIN) 875-125 MG tablet    Sig: Take 1 tablet by mouth 2 (two) times daily.    Dispense:  20 tablet    Refill:  0    Time spent:10 Minutes Time spent with patient included reviewing progress notes, labs, imaging studies, and discussing plan for follow up.  [place] Controlled Substance Database was reviewed by me for overdose risk score (ORS) if appropriate.  This patient was seen by Jonetta Osgood, FNP-C in collaboration with Dr. Clayborn Bigness as a part  of collaborative care agreement.  Rhema Boyett R. Valetta Fuller, MSN, FNP-C Internal medicine

## 2021-09-23 ENCOUNTER — Encounter: Payer: Self-pay | Admitting: Nurse Practitioner

## 2021-09-24 ENCOUNTER — Other Ambulatory Visit: Payer: Self-pay | Admitting: Nurse Practitioner

## 2021-09-24 ENCOUNTER — Other Ambulatory Visit: Payer: Self-pay

## 2021-09-24 DIAGNOSIS — F321 Major depressive disorder, single episode, moderate: Secondary | ICD-10-CM

## 2021-09-24 DIAGNOSIS — E039 Hypothyroidism, unspecified: Secondary | ICD-10-CM

## 2021-09-24 DIAGNOSIS — J309 Allergic rhinitis, unspecified: Secondary | ICD-10-CM

## 2021-09-24 DIAGNOSIS — M2550 Pain in unspecified joint: Secondary | ICD-10-CM

## 2021-09-24 MED ORDER — OMEPRAZOLE 20 MG PO CPDR: DELAYED_RELEASE_CAPSULE | ORAL | 0 refills | Status: DC

## 2021-09-24 MED ORDER — GABAPENTIN 600 MG PO TABS: ORAL_TABLET | ORAL | 0 refills | Status: DC

## 2021-09-24 MED ORDER — SERTRALINE HCL 50 MG PO TABS: 50.0000 mg | ORAL_TABLET | Freq: Every day | ORAL | 0 refills | Status: DC

## 2021-09-24 MED ORDER — MELOXICAM 15 MG PO TABS: 15.0000 mg | ORAL_TABLET | Freq: Every day | ORAL | 0 refills | Status: DC

## 2021-09-24 MED ORDER — TRAZODONE HCL 50 MG PO TABS: 50.0000 mg | ORAL_TABLET | Freq: Every evening | ORAL | 0 refills | Status: DC | PRN

## 2021-09-24 MED ORDER — LEVOTHYROXINE SODIUM 100 MCG PO TABS: 100.0000 ug | ORAL_TABLET | Freq: Every day | ORAL | 0 refills | Status: DC

## 2021-10-11 IMAGING — MG DIGITAL SCREENING BILAT W/ TOMO W/ CAD
6 of 10 series · 6 of 30 positions shown · non-contrast
Comparison: Previous exam(s).

CLINICAL DATA: Screening.

EXAM:
DIGITAL SCREENING BILATERAL MAMMOGRAM WITH TOMO AND CAD

[R MLO synth-2D]
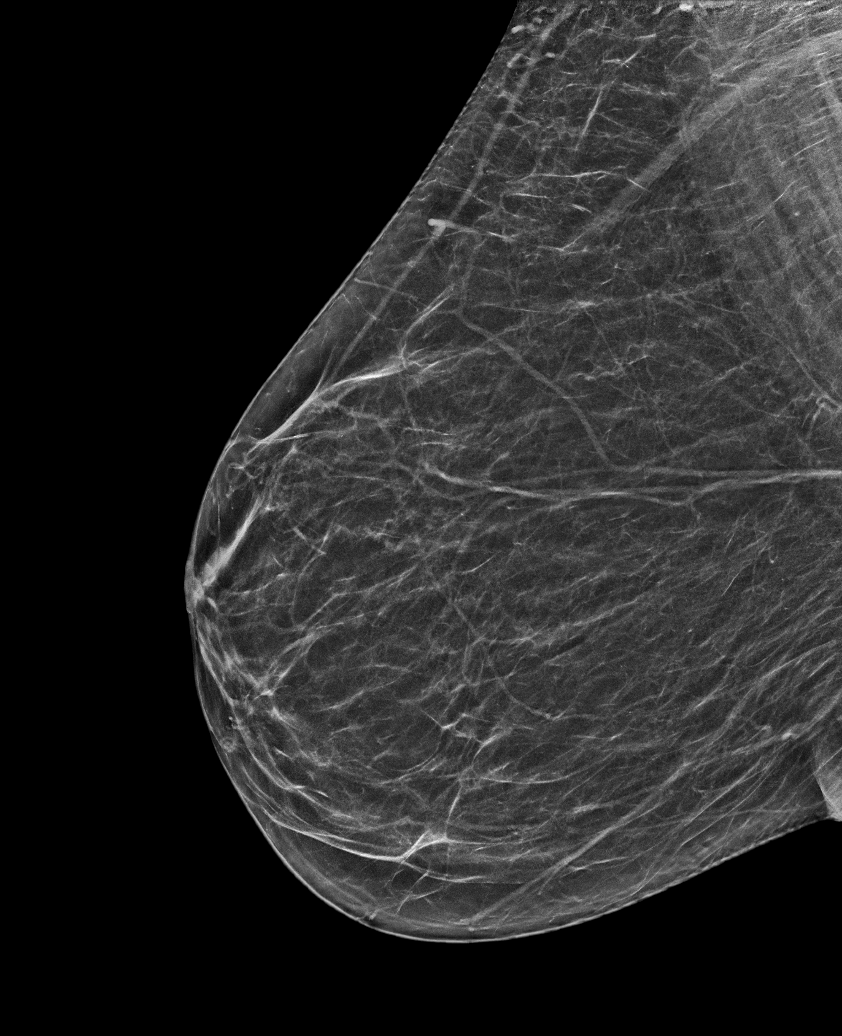

[R AT synth-2D]
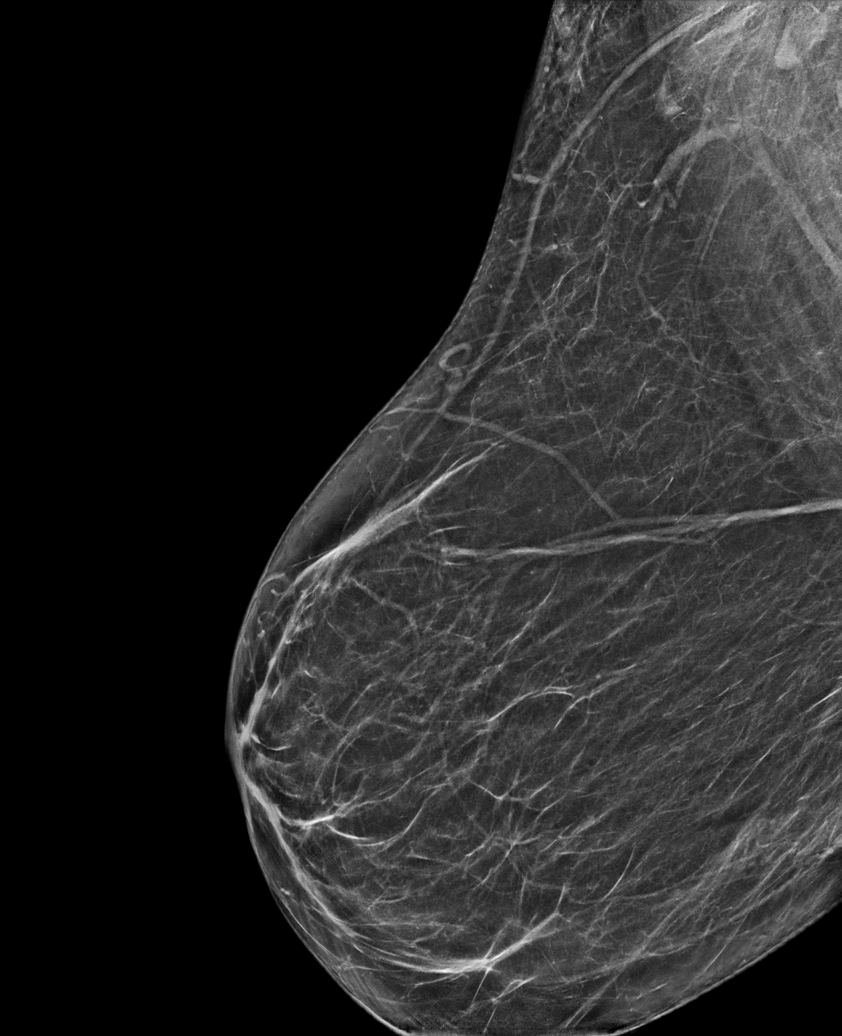

[L MLO synth-2D]
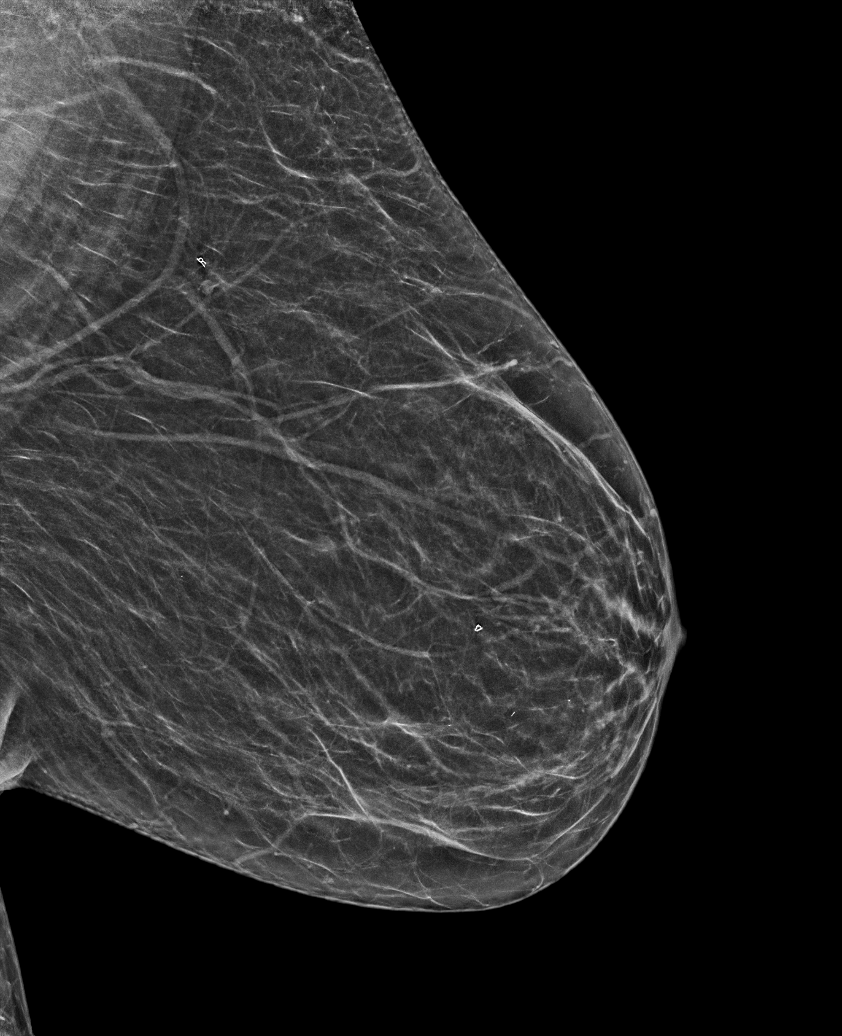

[L CC synth-2D]
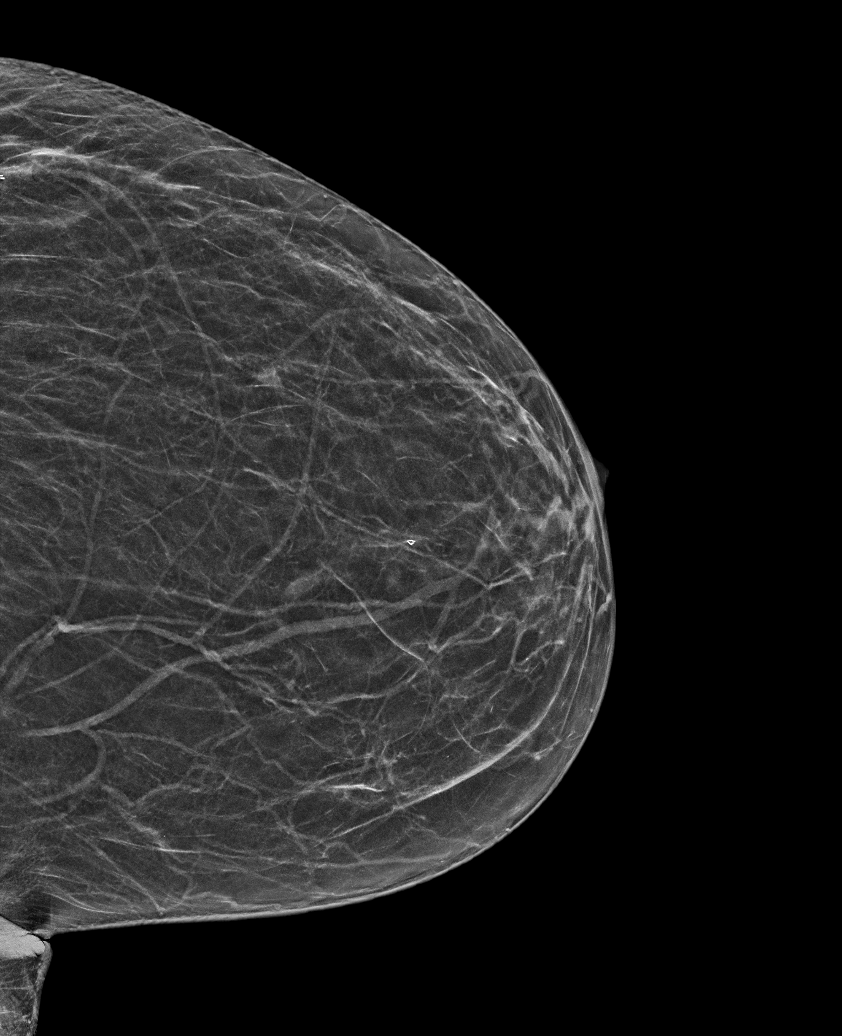

[R CC synth-2D]
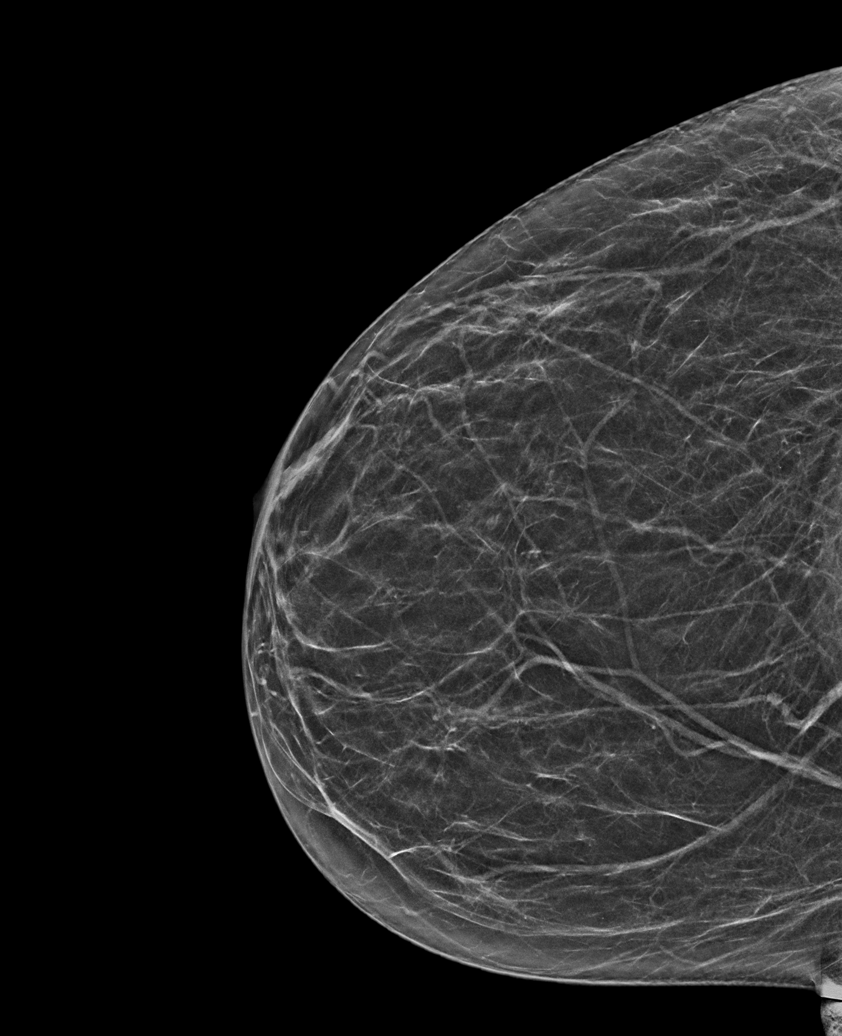

[L CC tomo · tomo slice 29/56.0]
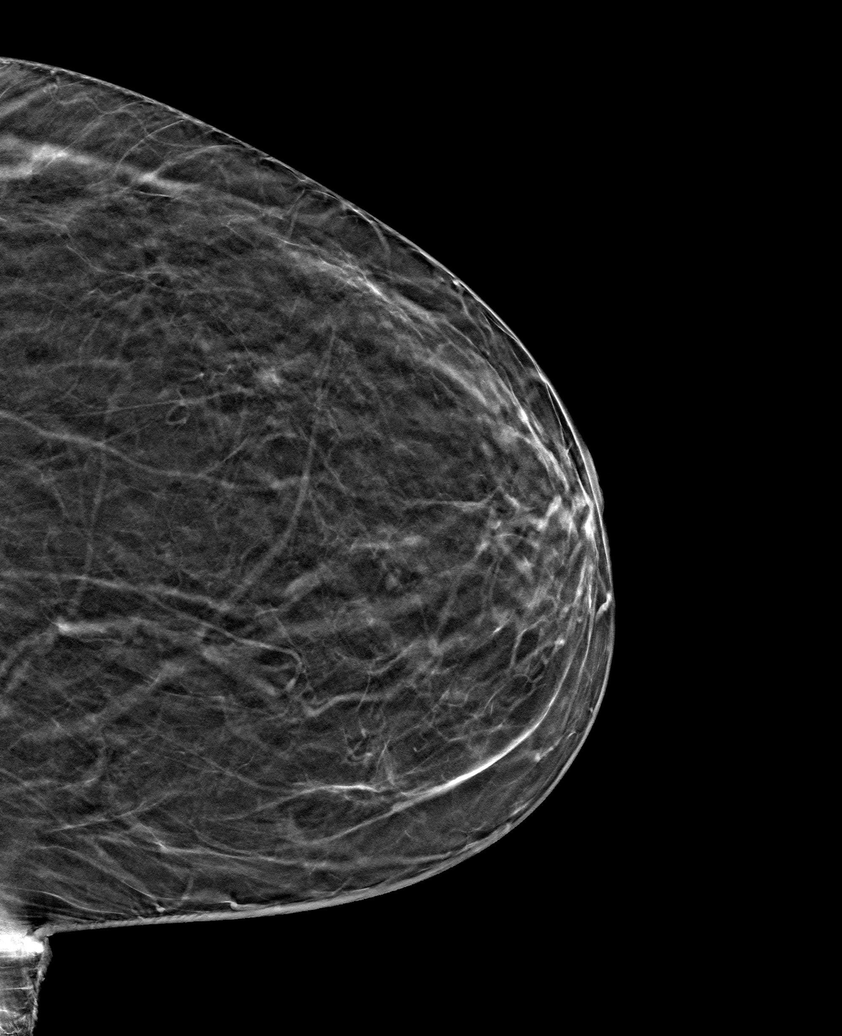

[6 of 30 positions shown; findings below may reference images not displayed]

ACR Breast Density Category b: There are scattered areas of
fibroglandular density.
FINDINGS: There are no findings suspicious for malignancy. Images were
processed with CAD.
IMPRESSION: No mammographic evidence of malignancy. A result letter of this
screening mammogram will be mailed directly to the patient.

RECOMMENDATION:
Screening mammogram in one year. (Code:CN-U-775)

BI-RADS CATEGORY  1: Negative.

## 2021-10-22 ENCOUNTER — Other Ambulatory Visit: Payer: Self-pay | Admitting: Nurse Practitioner

## 2021-10-22 DIAGNOSIS — K581 Irritable bowel syndrome with constipation: Secondary | ICD-10-CM

## 2021-11-24 ENCOUNTER — Telehealth: Payer: Self-pay

## 2021-11-24 NOTE — Telephone Encounter (Signed)
Left vm and sent mychart message to confirm 11/26/21 appointment-Toni ?

## 2021-11-26 ENCOUNTER — Ambulatory Visit (INDEPENDENT_AMBULATORY_CARE_PROVIDER_SITE_OTHER): Payer: BC Managed Care – PPO | Admitting: Nurse Practitioner

## 2021-11-26 ENCOUNTER — Encounter: Payer: Self-pay | Admitting: Nurse Practitioner

## 2021-11-26 VITALS — BP 121/69 | HR 78 | Temp 98.3°F | Resp 16 | Ht 65.0 in | Wt 215.2 lb

## 2021-11-26 DIAGNOSIS — E782 Mixed hyperlipidemia: Secondary | ICD-10-CM

## 2021-11-26 DIAGNOSIS — E559 Vitamin D deficiency, unspecified: Secondary | ICD-10-CM

## 2021-11-26 DIAGNOSIS — F321 Major depressive disorder, single episode, moderate: Secondary | ICD-10-CM

## 2021-11-26 DIAGNOSIS — K219 Gastro-esophageal reflux disease without esophagitis: Secondary | ICD-10-CM | POA: Diagnosis not present

## 2021-11-26 DIAGNOSIS — E039 Hypothyroidism, unspecified: Secondary | ICD-10-CM

## 2021-11-26 DIAGNOSIS — M2550 Pain in unspecified joint: Secondary | ICD-10-CM

## 2021-11-26 DIAGNOSIS — J309 Allergic rhinitis, unspecified: Secondary | ICD-10-CM | POA: Diagnosis not present

## 2021-11-26 DIAGNOSIS — Z780 Asymptomatic menopausal state: Secondary | ICD-10-CM

## 2021-11-26 DIAGNOSIS — E538 Deficiency of other specified B group vitamins: Secondary | ICD-10-CM

## 2021-11-26 DIAGNOSIS — Z0001 Encounter for general adult medical examination with abnormal findings: Secondary | ICD-10-CM | POA: Diagnosis not present

## 2021-11-26 MED ORDER — LEVOTHYROXINE SODIUM 100 MCG PO TABS: 100.0000 ug | ORAL_TABLET | Freq: Every day | ORAL | 1 refills | Status: DC

## 2021-11-26 MED ORDER — TRAZODONE HCL 50 MG PO TABS: 50.0000 mg | ORAL_TABLET | Freq: Every evening | ORAL | 2 refills | Status: DC | PRN

## 2021-11-26 MED ORDER — MONTELUKAST SODIUM 10 MG PO TABS: ORAL_TABLET | ORAL | 1 refills | Status: DC

## 2021-11-26 MED ORDER — MELOXICAM 15 MG PO TABS
15.0000 mg | ORAL_TABLET | Freq: Every day | ORAL | 1 refills | Status: DC
Start: 2021-11-26 — End: 2022-05-14

## 2021-11-26 MED ORDER — GABAPENTIN 600 MG PO TABS: ORAL_TABLET | ORAL | 1 refills | Status: DC

## 2021-11-26 MED ORDER — SERTRALINE HCL 50 MG PO TABS: 50.0000 mg | ORAL_TABLET | Freq: Every day | ORAL | 1 refills | Status: DC

## 2021-11-26 MED ORDER — OMEPRAZOLE 20 MG PO CPDR: DELAYED_RELEASE_CAPSULE | ORAL | 1 refills | Status: DC

## 2021-11-26 NOTE — Progress Notes (Incomplete)
[facility] [place], [place] [address]  Internal MEDICINE  Office Visit Note  Patient Name: Sherry Evans  [id number]  [id number]  Date of Service: 11/26/2021  Chief Complaint  Patient presents with   Annual Exam   Sinusitis    HPI Sherry Evans presents for an annual well visit and physical exam.  She is a well-appearing 61 year old female with hypothyroidism, gastroesophageal reflux, obstructive sleep apnea and anxiety.  She has been having difficulty with her sinuses and allergic rhinitis since December and is seeing an allergy specialist.  She was recently started on Xyzal and a budesonide nebulizer treatment.  She has a OB/GYN that she sees for women's health.  She is due for her routine mammogram in August this year and she had her Pap smear last year.  She is not due for routine colonoscopy until 2025.  She has not had a bone density scan yet and she is asymptomatic postmenopausal so she is interested in getting her bone density scan because she does have osteoporosis in her family.  She is due for routine labs this month or no later than April.  She does not have any other concerns today and denies any pain.    Current Medication: Outpatient Encounter Medications as of 11/26/2021  Medication Sig   budesonide (PULMICORT) 0.5 MG/2ML nebulizer solution Take by nebulization 2 (two) times daily.   Cholecalciferol 50 MCG (2000 UT) TABS Take 2,000 Units by mouth daily.   ketoconazole (NIZORAL) 2 % cream Apply 1 application topically daily.   levocetirizine (XYZAL) 5 MG tablet Take 5 mg by mouth daily.   LINZESS 145 MCG CAPS capsule TAKE 1 CAPSULE BY MOUTH DAILY BEFORE BREAKFAST.   Multiple Vitamins-Calcium (VIACTIV MULTI-VITAMIN) CHEW Chew 1 tablet by mouth daily.   nystatin (MYCOSTATIN) 100000 UNIT/ML suspension PLEASE SEE ATTACHED FOR DETAILED DIRECTIONS   nystatin-triamcinolone ointment (MYCOLOG) Apply 1 application topically 2 (two) times daily as needed.    Probiotic Product (PROBIOTIC-10 PO) Take by mouth.   [DISCONTINUED] butalbital-acetaminophen-caffeine (FIORICET) 50-325-40 MG tablet Take 1 tablet by mouth every 6 (six) hours as needed for headache.   [DISCONTINUED] gabapentin (NEURONTIN) 600 MG tablet TAKE 2 TABLETS BY MOUTH AT EVENING MEAL   [DISCONTINUED] levothyroxine (SYNTHROID) 100 MCG tablet Take 1 tablet (100 mcg total) by mouth daily.   [DISCONTINUED] meloxicam (MOBIC) 15 MG tablet Take 1 tablet (15 mg total) by mouth daily.   [DISCONTINUED] montelukast (SINGULAIR) 10 MG tablet TAKE 1 TABLET BY MOUTH DAILY FOR ALLERGIES   [DISCONTINUED] omeprazole (PRILOSEC) 20 MG capsule TAKE 2 CAPSULES (40 MG TOTAL) BY MOUTH DAILY   [DISCONTINUED] sertraline (ZOLOFT) 50 MG tablet Take 1 tablet (50 mg total) by mouth daily.   [DISCONTINUED] traZODone (DESYREL) 50 MG tablet Take 1 tablet (50 mg total) by mouth at bedtime as needed.   gabapentin (NEURONTIN) 600 MG tablet TAKE 2 TABLETS BY MOUTH AT EVENING MEAL   levothyroxine (SYNTHROID) 100 MCG tablet Take 1 tablet (100 mcg total) by mouth daily.   meloxicam (MOBIC) 15 MG tablet Take 1 tablet (15 mg total) by mouth daily.   montelukast (SINGULAIR) 10 MG tablet TAKE 1 TABLET BY MOUTH DAILY FOR ALLERGIES   omeprazole (PRILOSEC) 20 MG capsule TAKE 2 CAPSULES (40 MG TOTAL) BY MOUTH DAILY   sertraline (ZOLOFT) 50 MG tablet Take 1 tablet (50 mg total) by mouth daily.   traZODone (DESYREL) 50 MG tablet Take 1 tablet (50 mg total) by mouth at bedtime as needed for sleep.   [  DISCONTINUED] amoxicillin-clavulanate (AUGMENTIN) 875-125 MG tablet Take 1 tablet by mouth 2 (two) times daily. (Patient not taking: Reported on 11/26/2021)   [DISCONTINUED] cetirizine (ZYRTEC) 10 MG tablet Take 10 mg by mouth daily. (Patient not taking: Reported on 11/26/2021)   No facility-administered encounter medications on file as of 11/26/2021.    Surgical History: Past Surgical History:  Procedure Laterality Date    adnoids removed  1969   BACK SURGERY  2008   BICEPT TENODESIS Right 04/02/2016   Procedure: BICEPS TENODESIS;  Surgeon: Corky Mull, MD;  Location: [facility];  Service: Orthopedics;  Laterality: Right;   BLADDER SUSPENSION  2009   BREAST BIOPSY Left 1990's   benign   BREAST BIOPSY Left 10/28/2015   SMALL FRAGMENTS OF LYMPHOID TISSUE   CHOLECYSTECTOMY     COLONOSCOPY     DILATION AND CURETTAGE OF UTERUS     GASTRIC RESTRICTION SURGERY  08/23/2015   HERNIA REPAIR  2016   hiatal   JOINT REPLACEMENT Bilateral 2014   Partial Knee Replacement,  Surgeon: Dr. Little Ishikawa, Brooke Bonito. [facility]   KNEE SURGERY  2014   SHOULDER ARTHROSCOPY WITH DEBRIDEMENT AND BICEP TENDON REPAIR Right 04/02/2016   Procedure: SHOULDER ARTHROSCOPY WITH DEBRIDEMENT ;  Surgeon: Corky Mull, MD;  Location: [facility];  Service: Orthopedics;  Laterality: Right;   SHOULDER ARTHROSCOPY WITH OPEN ROTATOR CUFF REPAIR Right 04/02/2016   Procedure: SHOULDER ARTHROSCOPY WITH OPEN ROTATOR CUFF REPAIR;  Surgeon: Corky Mull, MD;  Location: [facility];  Service: Orthopedics;  Laterality: Right;   SHOULDER ARTHROSCOPY WITH SUBACROMIAL DECOMPRESSION Right 04/02/2016   Procedure: SHOULDER ARTHROSCOPY WITH SUBACROMIAL DECOMPRESSION;  Surgeon: Corky Mull, MD;  Location: [facility];  Service: Orthopedics;  Laterality: Right;   TENDON RELEASE Right 08/2021    Medical History: Past Medical History:  Diagnosis Date   Anxiety    Depression    GERD (gastroesophageal reflux disease)    History of sinus surgery    3 times   Hypothyroidism    Sleep apnea    use C-PAP   Trigger finger     Family History: Family History  Problem Relation Age of Onset   Hypertension Father    Diabetes Father    Breast cancer Maternal Aunt        great aunts. 82-80   Breast cancer Maternal Grandmother 70       metatastic to breast   Ovarian cancer Maternal Grandmother    Breast cancer Paternal Grandmother         71's   Colon cancer Neg Hx     Social History   Socioeconomic History   Marital status: Married    Spouse name: Not on file   Number of children: Not on file   Years of education: Not on file   Highest education level: Not on file  Occupational History   Not on file  Tobacco Use   Smoking status: Never   Smokeless tobacco: Never  Vaping Use   Vaping Use: Never used  Substance and Sexual Activity   Alcohol use: No    Alcohol/week: 0.0 standard drinks   Drug use: No   Sexual activity: Yes    Birth control/protection: Post-menopausal  Other Topics Concern   Not on file  Social History Narrative   Not on file   Social Determinants of Health   Financial Resource Strain: Not on file  Food Insecurity: Not on file  Transportation Needs: Not on file  Physical Activity: Not  on file  Stress: Not on file  Social Connections: Not on file  Intimate Partner Violence: Not on file      Review of Systems  Constitutional:  Negative for activity change, appetite change, chills, fatigue, fever and unexpected weight change.  HENT: Negative.  Negative for congestion, ear pain, rhinorrhea, sore throat and trouble swallowing.   Eyes: Negative.   Respiratory: Negative.  Negative for cough, chest tightness, shortness of breath and wheezing.   Cardiovascular: Negative.  Negative for chest pain.  Gastrointestinal: Negative.  Negative for abdominal pain, blood in stool, constipation, diarrhea, nausea and vomiting.  Endocrine: Negative.   Genitourinary: Negative.  Negative for difficulty urinating, dysuria, frequency, hematuria and urgency.  Musculoskeletal: Negative.  Negative for arthralgias, back pain, joint swelling, myalgias and neck pain.  Skin: Negative.  Negative for rash and wound.  Allergic/Immunologic: Negative.  Negative for immunocompromised state.  Neurological: Negative.  Negative for dizziness, seizures, numbness and headaches.  Hematological: Negative.    Psychiatric/Behavioral: Negative.  Negative for behavioral problems, self-injury and suicidal ideas. The patient is not nervous/anxious.    Vital Signs: BP 121/69    Pulse 78    Temp 98.3 F (36.8 C)    Resp 16    Ht '5\' 5"'  (1.651 m)    Wt 215 lb 3.2 oz (97.6 kg)    LMP 01/03/2016    SpO2 99%    BMI 35.81 kg/m    Physical Exam Vitals reviewed.  Constitutional:      General: She is not in acute distress.    Appearance: She is well-developed. She is not diaphoretic.  HENT:     Head: Normocephalic and atraumatic.     Right Ear: External ear normal.     Left Ear: External ear normal.     Nose: Nose normal.     Mouth/Throat:     Pharynx: No oropharyngeal exudate.  Eyes:     General: No scleral icterus.       Right eye: No discharge.        Left eye: No discharge.     Conjunctiva/sclera: Conjunctivae normal.     Pupils: Pupils are equal, round, and reactive to light.  Neck:     Thyroid: No thyromegaly.     Vascular: No JVD.     Trachea: No tracheal deviation.  Cardiovascular:     Rate and Rhythm: Normal rate and regular rhythm.     Heart sounds: Normal heart sounds. No murmur heard.   No friction rub. No gallop.  Pulmonary:     Effort: Pulmonary effort is normal. No respiratory distress.     Breath sounds: Normal breath sounds. No stridor. No wheezing or rales.  Chest:     Chest wall: No tenderness.  Abdominal:     General: Bowel sounds are normal. There is no distension.     Palpations: Abdomen is soft. There is no mass.     Tenderness: There is no abdominal tenderness. There is no guarding or rebound.  Musculoskeletal:        General: No tenderness or deformity. Normal range of motion.     Cervical back: Normal range of motion and neck supple.  Lymphadenopathy:     Cervical: No cervical adenopathy.  Skin:    General: Skin is warm and dry.     Coloration: Skin is not pale.     Findings: No erythema or rash.  Neurological:     Mental Status: She is alert.     Cranial  Nerves: No cranial nerve deficit.     Motor: No abnormal muscle tone.     Coordination: Coordination normal.     Deep Tendon Reflexes: Reflexes are normal and symmetric.  Psychiatric:        Behavior: Behavior normal.        Thought Content: Thought content normal.        Judgment: Judgment normal.       Assessment/Plan: 1. Asymptomatic menopausal state - CBC with Differential/Platelet - CMP14+EGFR - Iron, TIBC and Ferritin Panel - DG Bone Density; Future  2. Depression, major, single episode, moderate (HCC) - CBC with Differential/Platelet - CMP14+EGFR - Iron, TIBC and Ferritin Panel - sertraline (ZOLOFT) 50 MG tablet; Take 1 tablet (50 mg total) by mouth daily.  Dispense: 90 tablet; Refill: 1 - traZODone (DESYREL) 50 MG tablet; Take 1 tablet (50 mg total) by mouth at bedtime as needed for sleep.  Dispense: 30 tablet; Refill: 2  3. Acquired hypothyroidism - CBC with Differential/Platelet - CMP14+EGFR - TSH + free T4 - Iron, TIBC and Ferritin Panel - levothyroxine (SYNTHROID) 100 MCG tablet; Take 1 tablet (100 mcg total) by mouth daily.  Dispense: 90 tablet; Refill: 1  4. Chronic allergic rhinitis - CBC with Differential/Platelet - CMP14+EGFR - Iron, TIBC and Ferritin Panel - montelukast (SINGULAIR) 10 MG tablet; TAKE 1 TABLET BY MOUTH DAILY FOR ALLERGIES  Dispense: 90 tablet; Refill: 1 - omeprazole (PRILOSEC) 20 MG capsule; TAKE 2 CAPSULES (40 MG TOTAL) BY MOUTH DAILY  Dispense: 180 capsule; Refill: 1  5. Gastroesophageal reflux disease without esophagitis - CBC with Differential/Platelet - CMP14+EGFR - Iron, TIBC and Ferritin Panel  6. Vitamin D deficiency - CBC with Differential/Platelet - CMP14+EGFR - Vitamin D (25 hydroxy) - Iron, TIBC and Ferritin Panel  7. Encounter for general adult medical examination with abnormal findings - CBC with Differential/Platelet - CMP14+EGFR - Iron, TIBC and Ferritin Panel  8. Arthralgia, unspecified joint - CBC with  Differential/Platelet - CMP14+EGFR - Iron, TIBC and Ferritin Panel - meloxicam (MOBIC) 15 MG tablet; Take 1 tablet (15 mg total) by mouth daily.  Dispense: 90 tablet; Refill: 1 - gabapentin (NEURONTIN) 600 MG tablet; TAKE 2 TABLETS BY MOUTH AT EVENING MEAL  Dispense: 180 tablet; Refill: 1  9. Mixed hyperlipidemia - CBC with Differential/Platelet - CMP14+EGFR - Lipid Profile - Iron, TIBC and Ferritin Panel  10. B12 deficiency - B12 and Folate Panel - Iron, TIBC and Ferritin Panel     General Counseling: Sherry Evans verbalizes understanding of the findings of todays visit and agrees with plan of treatment. I have discussed any further diagnostic evaluation that may be needed or ordered today. We also reviewed her medications today. she has been encouraged to call the office with any questions or concerns that should arise related to todays visit.    Orders Placed This Encounter  Procedures   DG Bone Density   CBC with Differential/Platelet   CMP14+EGFR   Lipid Profile   TSH + free T4   Vitamin D (25 hydroxy)   B12 and Folate Panel   Iron, TIBC and Ferritin Panel    Meds ordered this encounter  Medications   sertraline (ZOLOFT) 50 MG tablet    Sig: Take 1 tablet (50 mg total) by mouth daily.    Dispense:  90 tablet    Refill:  1   levothyroxine (SYNTHROID) 100 MCG tablet    Sig: Take 1 tablet (100 mcg total) by mouth daily.    Dispense:  90 tablet  Refill:  1   meloxicam (MOBIC) 15 MG tablet    Sig: Take 1 tablet (15 mg total) by mouth daily.    Dispense:  90 tablet    Refill:  1   traZODone (DESYREL) 50 MG tablet    Sig: Take 1 tablet (50 mg total) by mouth at bedtime as needed for sleep.    Dispense:  30 tablet    Refill:  2   gabapentin (NEURONTIN) 600 MG tablet    Sig: TAKE 2 TABLETS BY MOUTH AT EVENING MEAL    Dispense:  180 tablet    Refill:  1   montelukast (SINGULAIR) 10 MG tablet    Sig: TAKE 1 TABLET BY MOUTH DAILY FOR ALLERGIES    Dispense:   90 tablet    Refill:  1   omeprazole (PRILOSEC) 20 MG capsule    Sig: TAKE 2 CAPSULES (40 MG TOTAL) BY MOUTH DAILY    Dispense:  180 capsule    Refill:  1    Return in about 6 months (around 05/29/2022) for F/U, med refill, Sherry Evans PCP.   Total time spent:*** Minutes Time spent includes review of chart, medications, test results, and follow up plan with the patient.   [place] Controlled Substance Database was reviewed by me.  This patient was seen by Jonetta Osgood, FNP-C in collaboration with Dr. Clayborn Bigness as a part of collaborative care agreement.  Sherry Hege R. Valetta Fuller, MSN, FNP-C Internal medicine

## 2021-12-17 ENCOUNTER — Encounter: Payer: Self-pay | Admitting: Nurse Practitioner

## 2021-12-31 LAB — B12 AND FOLATE PANEL
Folate: >20 ng/mL (ref >3.0)
Vitamin B-12: 1549 pg/mL — ABNORMAL HIGH (ref 232–1245)

## 2021-12-31 LAB — CBC WITH DIFFERENTIAL/PLATELET
Basophils Absolute: 0.1 10*3/uL (ref 0.0–0.2)
Basos: 1 %
EOS (ABSOLUTE): 0.1 10*3/uL (ref 0.0–0.4)
Eos: 2 %
Hematocrit: 40.1 % (ref 34.0–46.6)
Hemoglobin: 13 g/dL (ref 11.1–15.9)
Immature Grans (Abs): 0 10*3/uL (ref 0.0–0.1)
Immature Granulocytes: 0 %
Lymphocytes Absolute: 1.7 10*3/uL (ref 0.7–3.1)
Lymphs: 36 %
MCH: 30.8 pg (ref 26.6–33.0)
MCHC: 32.4 g/dL (ref 31.5–35.7)
MCV: 95 fL (ref 79–97)
Monocytes Absolute: 0.3 10*3/uL (ref 0.1–0.9)
Monocytes: 7 %
Neutrophils Absolute: 2.5 10*3/uL (ref 1.4–7.0)
Neutrophils: 54 %
Platelets: 261 10*3/uL (ref 150–450)
RBC: 4.22 x10E6/uL (ref 3.77–5.28)
RDW: 12 % (ref 11.7–15.4)
WBC: 4.7 10*3/uL (ref 3.4–10.8)

## 2021-12-31 LAB — CMP14+EGFR
ALT: 18 IU/L (ref 0–32)
AST: 27 IU/L (ref 0–40)
Albumin/Globulin Ratio: 1.8 (ref 1.2–2.2)
Albumin: 4.3 g/dL (ref 3.8–4.9)
Alkaline Phosphatase: 76 IU/L (ref 44–121)
BUN/Creatinine Ratio: 23 (ref 9–23)
BUN: 20 mg/dL (ref 6–24)
Bilirubin Total: 0.5 mg/dL (ref 0.0–1.2)
CO2: 23 mmol/L (ref 20–29)
Calcium: 9.2 mg/dL (ref 8.7–10.2)
Chloride: 103 mmol/L (ref 96–106)
Creatinine, Ser: 0.86 mg/dL (ref 0.57–1.00)
Globulin, Total: 2.4 g/dL (ref 1.5–4.5)
Glucose: 92 mg/dL (ref 70–99)
Potassium: 4.1 mmol/L (ref 3.5–5.2)
Sodium: 143 mmol/L (ref 134–144)
Total Protein: 6.7 g/dL (ref 6.0–8.5)
eGFR: 78 mL/min/{1.73_m2} (ref >59)

## 2021-12-31 LAB — LIPID PANEL
Chol/HDL Ratio: 3.2 ratio (ref 0.0–4.4)
Cholesterol, Total: 206 mg/dL — ABNORMAL HIGH (ref 100–199)
HDL: 64 mg/dL (ref >39)
LDL Chol Calc (NIH): 123 mg/dL — ABNORMAL HIGH (ref 0–99)
Triglycerides: 105 mg/dL (ref 0–149)
VLDL Cholesterol Cal: 19 mg/dL (ref 5–40)

## 2021-12-31 LAB — IRON,TIBC AND FERRITIN PANEL
Ferritin: 19 ng/mL (ref 15–150)
Iron Saturation: 26 % (ref 15–55)
Iron: 83 ug/dL (ref 27–159)
Total Iron Binding Capacity: 315 ug/dL (ref 250–450)
UIBC: 232 ug/dL (ref 131–425)

## 2021-12-31 LAB — TSH+FREE T4
Free T4: 1.31 ng/dL (ref 0.82–1.77)
TSH: 1.88 u[IU]/mL (ref 0.450–4.500)

## 2021-12-31 LAB — VITAMIN D 25 HYDROXY (VIT D DEFICIENCY, FRACTURES): Vit D, 25-Hydroxy: 57.8 ng/mL (ref 30.0–100.0)

## 2022-01-05 ENCOUNTER — Encounter: Payer: Self-pay | Admitting: Nurse Practitioner

## 2022-01-05 NOTE — Progress Notes (Signed)
Please call patient with her lab results: ?--Cholesterol levels are abnormal, total cholesterol slightly elevated, LDL slightly elevated as well.  Triglycerides, HDL and VLDL are all within normal limits.  Cholesterol/HDL ratio is 3.2 which is less than half the average risk of developing heart disease.  Recommend limiting red meat intake, and increasing lean proteins in diet.  Also recommend starting an OTC omega-3 fish oil supplement 1000 mg daily. ?-- B12 and folate levels are normal ?--CBC is normal ?--Vitamin D level is within normal limits. ?--Metabolic panel is normal, kidney and liver function are both normal. ?--Thyroid levels and iron panel are both normal.

## 2022-01-06 ENCOUNTER — Telehealth: Payer: Self-pay

## 2022-01-06 NOTE — Telephone Encounter (Signed)
-----   Message from Jonetta Osgood, NP sent at 01/05/2022  9:06 PM EDT ----- ?Please call patient with her lab results: ?--Cholesterol levels are abnormal, total cholesterol slightly elevated, LDL slightly elevated as well.  Triglycerides, HDL and VLDL are all within normal limits.  Cholesterol/HDL ratio is 3.2 which is less than half the average risk of developing heart disease.  Recommend limiting red meat intake, and increasing lean proteins in diet.  Also recommend starting an OTC omega-3 fish oil supplement 1000 mg daily. ?-- B12 and folate levels are normal ?--CBC is normal ?--Vitamin D level is within normal limits. ?--Metabolic panel is normal, kidney and liver function are both normal. ?--Thyroid levels and iron panel are both normal. ?

## 2022-01-06 NOTE — Telephone Encounter (Signed)
Spoke to pt, provided results and diet suggestions and informed her the the OTC fish oil recommendation  ?

## 2022-01-28 ENCOUNTER — Encounter: Payer: Self-pay | Admitting: Nurse Practitioner

## 2022-01-28 ENCOUNTER — Ambulatory Visit (INDEPENDENT_AMBULATORY_CARE_PROVIDER_SITE_OTHER): Payer: BC Managed Care – PPO | Admitting: Nurse Practitioner

## 2022-01-28 VITALS — BP 132/64 | HR 71 | Temp 98.1°F | Resp 16 | Ht 65.0 in | Wt 216.4 lb

## 2022-01-28 DIAGNOSIS — M2550 Pain in unspecified joint: Secondary | ICD-10-CM | POA: Diagnosis not present

## 2022-01-28 DIAGNOSIS — R21 Rash and other nonspecific skin eruption: Secondary | ICD-10-CM

## 2022-01-28 DIAGNOSIS — M792 Neuralgia and neuritis, unspecified: Secondary | ICD-10-CM

## 2022-01-28 DIAGNOSIS — R300 Dysuria: Secondary | ICD-10-CM | POA: Diagnosis not present

## 2022-01-28 DIAGNOSIS — G8929 Other chronic pain: Secondary | ICD-10-CM

## 2022-01-28 LAB — POCT URINALYSIS DIPSTICK
Bilirubin, UA: NEGATIVE
Blood, UA: NEGATIVE
Glucose, UA: NEGATIVE
Ketones, UA: NEGATIVE
Nitrite, UA: NEGATIVE
Protein, UA: POSITIVE — AB
Spec Grav, UA: 1.01 (ref 1.010–1.025)
Urobilinogen, UA: 0.2 E.U./dL
pH, UA: 5 (ref 5.0–8.0)

## 2022-01-28 MED ORDER — GABAPENTIN 600 MG PO TABS: ORAL_TABLET | ORAL | 1 refills | Status: DC

## 2022-01-28 MED ORDER — GABAPENTIN 100 MG PO CAPS: 100.0000 mg | ORAL_CAPSULE | Freq: Two times a day (BID) | ORAL | 1 refills | Status: DC

## 2022-01-28 MED ORDER — NYSTATIN-TRIAMCINOLONE 100000-0.1 UNIT/GM-% EX OINT: 1.0000 "application " | TOPICAL_OINTMENT | Freq: Two times a day (BID) | CUTANEOUS | 3 refills | Status: DC | PRN

## 2022-01-28 NOTE — Progress Notes (Signed)
[facility] [place], [place] [address]  Internal MEDICINE  Office Visit Note  Patient Name: Sherry Evans  [id number]  [id number]  Date of Service: 01/28/2022  Chief Complaint  Patient presents with   Follow-up    Wants to  make sure UTI has cleared up   Depression   Gastroesophageal Reflux   Anxiety   Medication Refill    Nystatin-Triamcinolone Ointment     HPI Sherry Evans presents for an acute sick visit for persistent neuropathy and nerve pain and musculoskeletal pain.  Patient wants to increase her gabapentin dose and this was discussed thoroughly with her today.  She also had a UTI and wants to do a urine culture to ensure that it has resolved.  She also needs refills of nystatin-triamcinolone ointment   Current Medication:  Outpatient Encounter Medications as of 01/28/2022  Medication Sig   budesonide (PULMICORT) 0.5 MG/2ML nebulizer solution Take by nebulization 2 (two) times daily.   Cholecalciferol 50 MCG (2000 UT) TABS Take 2,000 Units by mouth daily.   gabapentin (NEURONTIN) 100 MG capsule Take 1 capsule (100 mg total) by mouth 2 (two) times daily with breakfast and lunch.   ketoconazole (NIZORAL) 2 % cream Apply 1 application topically daily.   levocetirizine (XYZAL) 5 MG tablet Take 5 mg by mouth daily.   levothyroxine (SYNTHROID) 100 MCG tablet Take 1 tablet (100 mcg total) by mouth daily.   LINZESS 145 MCG CAPS capsule TAKE 1 CAPSULE BY MOUTH DAILY BEFORE BREAKFAST.   meloxicam (MOBIC) 15 MG tablet Take 1 tablet (15 mg total) by mouth daily.   montelukast (SINGULAIR) 10 MG tablet TAKE 1 TABLET BY MOUTH DAILY FOR ALLERGIES   Multiple Vitamins-Calcium (VIACTIV MULTI-VITAMIN) CHEW Chew 1 tablet by mouth daily.   nystatin (MYCOSTATIN) 100000 UNIT/ML suspension PLEASE SEE ATTACHED FOR DETAILED DIRECTIONS   omeprazole (PRILOSEC) 20 MG capsule TAKE 2 CAPSULES (40 MG TOTAL) BY MOUTH DAILY   Probiotic Product (PROBIOTIC-10 PO) Take by mouth.    sertraline (ZOLOFT) 50 MG tablet Take 1 tablet (50 mg total) by mouth daily.   traZODone (DESYREL) 50 MG tablet Take 1 tablet (50 mg total) by mouth at bedtime as needed for sleep.   [DISCONTINUED] gabapentin (NEURONTIN) 600 MG tablet TAKE 2 TABLETS BY MOUTH AT EVENING MEAL   [DISCONTINUED] nystatin-triamcinolone ointment (MYCOLOG) Apply 1 application topically 2 (two) times daily as needed.   gabapentin (NEURONTIN) 600 MG tablet TAKE 3 TABLETS BY MOUTH AT EVENING MEAL   nystatin-triamcinolone ointment (MYCOLOG) Apply 1 application. topically 2 (two) times daily as needed.   No facility-administered encounter medications on file as of 01/28/2022.      Medical History: Past Medical History:  Diagnosis Date   Anxiety    Depression    GERD (gastroesophageal reflux disease)    History of sinus surgery    3 times   Hypothyroidism    Sleep apnea    use C-PAP   Trigger finger      Vital Signs: BP 132/64   Pulse 71   Temp 98.1 F (36.7 C)   Resp 16   Ht '5\' 5"'$  (1.651 m)   Wt 216 lb 6.4 oz (98.2 kg)   LMP 01/03/2016   SpO2 100%   BMI 36.01 kg/m    Review of Systems  Constitutional:  Negative for chills, fatigue and unexpected weight change.  HENT:  Negative for congestion, rhinorrhea, sneezing and sore throat.   Eyes:  Negative for redness.  Respiratory:  Negative  for cough, chest tightness and shortness of breath.   Cardiovascular:  Negative for chest pain and palpitations.  Gastrointestinal:  Negative for abdominal pain, constipation, diarrhea, nausea and vomiting.  Genitourinary:  Positive for dysuria. Negative for frequency.  Musculoskeletal:  Positive for arthralgias, back pain and myalgias. Negative for joint swelling and neck pain.  Skin:  Negative for rash.  Neurological: Negative.  Negative for tremors and numbness.  Hematological:  Negative for adenopathy. Does not bruise/bleed easily.  Psychiatric/Behavioral:  Negative for behavioral problems (Depression), sleep  disturbance and suicidal ideas. The patient is not nervous/anxious.     Physical Exam Vitals reviewed.  Constitutional:      General: She is not in acute distress.    Appearance: Normal appearance. She is obese. She is not ill-appearing.  HENT:     Head: Normocephalic and atraumatic.  Eyes:     Pupils: Pupils are equal, round, and reactive to light.  Cardiovascular:     Rate and Rhythm: Normal rate and regular rhythm.  Pulmonary:     Effort: Pulmonary effort is normal. No respiratory distress.  Neurological:     Mental Status: She is alert and oriented to person, place, and time.  Psychiatric:        Mood and Affect: Mood normal.        Behavior: Behavior normal.       Assessment/Plan: 1. Arthralgia of multiple sites Bedtime dose of gabapentin increased to 1800 mg and a small 100 mg dose added for breakfast and around lunch/midday.  Patient was also instructed that if she only want to do the breakfast dose and not the midday dose that is fine but the midday dose was added just in case she ends up needing it.  The joint pains and nerve pain are worse at night but she does have them during the day which is why a small dose was added for morning and midday - gabapentin (NEURONTIN) 600 MG tablet; TAKE 3 TABLETS BY MOUTH AT EVENING MEAL  Dispense: 270 tablet; Refill: 1 - gabapentin (NEURONTIN) 100 MG capsule; Take 1 capsule (100 mg total) by mouth 2 (two) times daily with breakfast and lunch.  Dispense: 180 capsule; Refill: 1  2. Chronic peripheral neuropathic pain See problem #1 - gabapentin (NEURONTIN) 600 MG tablet; TAKE 3 TABLETS BY MOUTH AT EVENING MEAL  Dispense: 270 tablet; Refill: 1 - gabapentin (NEURONTIN) 100 MG capsule; Take 1 capsule (100 mg total) by mouth 2 (two) times daily with breakfast and lunch.  Dispense: 180 capsule; Refill: 1  3. Dysuria Urinalysis done, moderate leukocytes seen, urine culture sent - POCT Urinalysis Dipstick - CULTURE, URINE  COMPREHENSIVE  4. Rash and nonspecific skin eruption For recurrent skin irritation/rash, refills ordered - nystatin-triamcinolone ointment (MYCOLOG); Apply 1 application. topically 2 (two) times daily as needed.  Dispense: 30 g; Refill: 3   General Counseling: Sherry Evans verbalizes understanding of the findings of todays visit and agrees with plan of treatment. I have discussed any further diagnostic evaluation that may be needed or ordered today. We also reviewed her medications today. she has been encouraged to call the office with any questions or concerns that should arise related to todays visit.    Counseling:    Orders Placed This Encounter  Procedures   CULTURE, URINE COMPREHENSIVE   POCT Urinalysis Dipstick    Meds ordered this encounter  Medications   nystatin-triamcinolone ointment (MYCOLOG)    Sig: Apply 1 application. topically 2 (two) times daily as needed.  Dispense:  30 g    Refill:  3   gabapentin (NEURONTIN) 600 MG tablet    Sig: TAKE 3 TABLETS BY MOUTH AT EVENING MEAL    Dispense:  270 tablet    Refill:  1   gabapentin (NEURONTIN) 100 MG capsule    Sig: Take 1 capsule (100 mg total) by mouth 2 (two) times daily with breakfast and lunch.    Dispense:  180 capsule    Refill:  1    Return in about 1 month (around 02/28/2022) for F/U, Ahmoni Edge PCP gabapentin adjustment. .  [place] Controlled Substance Database was reviewed by me for overdose risk score (ORS)  Time spent:30 Minutes Time spent with patient included reviewing progress notes, labs, imaging studies, and discussing plan for follow up.   This patient was seen by Jonetta Osgood, FNP-C in collaboration with Dr. Clayborn Bigness as a part of collaborative care agreement.  Oryn Casanova R. Valetta Fuller, MSN, FNP-C Internal Medicine

## 2022-02-01 LAB — CULTURE, URINE COMPREHENSIVE

## 2022-03-02 ENCOUNTER — Ambulatory Visit (INDEPENDENT_AMBULATORY_CARE_PROVIDER_SITE_OTHER): Payer: BC Managed Care – PPO | Admitting: Nurse Practitioner

## 2022-03-02 ENCOUNTER — Encounter: Payer: Self-pay | Admitting: Nurse Practitioner

## 2022-03-02 VITALS — BP 120/80 | HR 86 | Temp 97.8°F | Resp 16 | Ht 65.0 in | Wt 217.0 lb

## 2022-03-02 DIAGNOSIS — K219 Gastro-esophageal reflux disease without esophagitis: Secondary | ICD-10-CM

## 2022-03-02 DIAGNOSIS — M5126 Other intervertebral disc displacement, lumbar region: Secondary | ICD-10-CM | POA: Diagnosis not present

## 2022-03-02 DIAGNOSIS — M2550 Pain in unspecified joint: Secondary | ICD-10-CM

## 2022-03-02 MED ORDER — OMEPRAZOLE 40 MG PO CPDR: 40.0000 mg | DELAYED_RELEASE_CAPSULE | Freq: Two times a day (BID) | ORAL | 1 refills | Status: DC

## 2022-03-02 NOTE — Progress Notes (Signed)
[facility] [place], [place] [address]  Internal MEDICINE  Office Visit Note  Patient Name: Sherry Evans  [id number]  [id number]  Date of Service: 03/02/2022  Chief Complaint  Patient presents with   Follow-up   Depression   Hypothyroidism   Anxiety    HPI Sherry Evans presents for follow-up visit for chronic joint and associated nerve pain and worsening acid reflux.  At her previous office visit, her gabapentin dose was adjusted and she has been taking 100 mg in the morning, 100 mg in the early afternoon and then 1800 mg at bedtime and she reports that this change in the dosing has been effective and has improved the joint and nerve pain throughout the day and at bedtime.  Another issue that she wanted to address today is that she went to her dentist and they noted significant erosion of the enamel on some of her teeth from acid reflux.  She had started sleeping with a wedge under her pillow at night to keep her head elevated and she confirms that her acid reflux does seem to be worse at night usually.  She has been taking 20 mg of omeprazole and wants to increase the dose today.    Current Medication: Outpatient Encounter Medications as of 03/02/2022  Medication Sig   budesonide (PULMICORT) 0.5 MG/2ML nebulizer solution Take by nebulization 2 (two) times daily.   Cholecalciferol 50 MCG (2000 UT) TABS Take 2,000 Units by mouth daily.   gabapentin (NEURONTIN) 100 MG capsule Take 1 capsule (100 mg total) by mouth 2 (two) times daily with breakfast and lunch.   gabapentin (NEURONTIN) 600 MG tablet TAKE 3 TABLETS BY MOUTH AT EVENING MEAL   ketoconazole (NIZORAL) 2 % cream Apply 1 application topically daily.   levocetirizine (XYZAL) 5 MG tablet Take 5 mg by mouth daily.   levothyroxine (SYNTHROID) 100 MCG tablet Take 1 tablet (100 mcg total) by mouth daily.   LINZESS 145 MCG CAPS capsule TAKE 1 CAPSULE BY MOUTH DAILY BEFORE BREAKFAST.   meloxicam (MOBIC) 15 MG  tablet Take 1 tablet (15 mg total) by mouth daily.   montelukast (SINGULAIR) 10 MG tablet TAKE 1 TABLET BY MOUTH DAILY FOR ALLERGIES   Multiple Vitamins-Calcium (VIACTIV MULTI-VITAMIN) CHEW Chew 1 tablet by mouth daily.   nystatin-triamcinolone ointment (MYCOLOG) Apply 1 application. topically 2 (two) times daily as needed.   omeprazole (PRILOSEC) 20 MG capsule TAKE 2 CAPSULES (40 MG TOTAL) BY MOUTH DAILY   omeprazole (PRILOSEC) 40 MG capsule Take 1 capsule (40 mg total) by mouth in the morning and at bedtime. May start with once daily and work up to twice daily.   Probiotic Product (PROBIOTIC-10 PO) Take by mouth.   sertraline (ZOLOFT) 50 MG tablet Take 1 tablet (50 mg total) by mouth daily.   traZODone (DESYREL) 50 MG tablet Take 1 tablet (50 mg total) by mouth at bedtime as needed for sleep.   [DISCONTINUED] nystatin (MYCOSTATIN) 100000 UNIT/ML suspension PLEASE SEE ATTACHED FOR DETAILED DIRECTIONS   No facility-administered encounter medications on file as of 03/02/2022.    Surgical History: Past Surgical History:  Procedure Laterality Date   adnoids removed  1969   BACK SURGERY  2008   BICEPT TENODESIS Right 04/02/2016   Procedure: BICEPS TENODESIS;  Surgeon: Corky Mull, MD;  Location: [facility] ORS;  Service: Orthopedics;  Laterality: Right;   BLADDER SUSPENSION  2009   BREAST BIOPSY Left 1990's   benign   BREAST BIOPSY Left 10/28/2015  SMALL FRAGMENTS OF LYMPHOID TISSUE   CHOLECYSTECTOMY     COLONOSCOPY     DILATION AND CURETTAGE OF UTERUS     GASTRIC RESTRICTION SURGERY  08/23/2015   HERNIA REPAIR  2016   hiatal   JOINT REPLACEMENT Bilateral 2014   Partial Knee Replacement,  Surgeon: Dr. Little Ishikawa, [facility]   KNEE SURGERY  2014   SHOULDER ARTHROSCOPY WITH DEBRIDEMENT AND BICEP TENDON REPAIR Right 04/02/2016   Procedure: SHOULDER ARTHROSCOPY WITH DEBRIDEMENT ;  Surgeon: Corky Mull, MD;  Location: [facility] ORS;  Service: Orthopedics;  Laterality: Right;   SHOULDER  ARTHROSCOPY WITH OPEN ROTATOR CUFF REPAIR Right 04/02/2016   Procedure: SHOULDER ARTHROSCOPY WITH OPEN ROTATOR CUFF REPAIR;  Surgeon: Corky Mull, MD;  Location: [facility] ORS;  Service: Orthopedics;  Laterality: Right;   SHOULDER ARTHROSCOPY WITH SUBACROMIAL DECOMPRESSION Right 04/02/2016   Procedure: SHOULDER ARTHROSCOPY WITH SUBACROMIAL DECOMPRESSION;  Surgeon: Corky Mull, MD;  Location: [facility] ORS;  Service: Orthopedics;  Laterality: Right;   TENDON RELEASE Right 08/2021    Medical History: Past Medical History:  Diagnosis Date   Anxiety    Depression    GERD (gastroesophageal reflux disease)    History of sinus surgery    3 times   Hypothyroidism    Sleep apnea    use C-PAP   Trigger finger     Family History: Family History  Problem Relation Age of Onset   Hypertension Father    Diabetes Father    Breast cancer Maternal Aunt        great aunts. 70-80   Breast cancer Maternal Grandmother 70       metatastic to breast   Ovarian cancer Maternal Grandmother    Breast cancer Paternal Grandmother        67's   Colon cancer Neg Hx     Social History   Socioeconomic History   Marital status: Married    Spouse name: Not on file   Number of children: Not on file   Years of education: Not on file   Highest education level: Not on file  Occupational History   Not on file  Tobacco Use   Smoking status: Never   Smokeless tobacco: Never  Vaping Use   Vaping Use: Never used  Substance and Sexual Activity   Alcohol use: No    Alcohol/week: 0.0 standard drinks of alcohol   Drug use: No   Sexual activity: Yes    Birth control/protection: Post-menopausal  Other Topics Concern   Not on file  Social History Narrative   Not on file   Social Determinants of Health   Financial Resource Strain: Not on file  Food Insecurity: Not on file  Transportation Needs: Not on file  Physical Activity: Inactive (12/08/2017)   Exercise Vital Sign    Days of Exercise per Week: 0 days     Minutes of Exercise per Session: 0 min  Stress: Not on file  Social Connections: Not on file  Intimate Partner Violence: Not on file      Review of Systems  Constitutional:  Negative for chills, fatigue and unexpected weight change.  HENT:  Negative for congestion, rhinorrhea, sneezing and sore throat.   Eyes:  Negative for redness.  Respiratory: Negative.  Negative for cough, chest tightness, shortness of breath and wheezing.   Cardiovascular: Negative.  Negative for chest pain and palpitations.  Gastrointestinal:  Negative for abdominal pain, constipation, diarrhea, nausea and vomiting.  Genitourinary:  Negative for dysuria  and frequency.  Musculoskeletal:  Positive for arthralgias, back pain, joint swelling and myalgias. Negative for neck pain.  Neurological: Negative.  Negative for tremors and numbness.  Hematological:  Negative for adenopathy. Does not bruise/bleed easily.  Psychiatric/Behavioral:  Negative for behavioral problems (Depression), sleep disturbance and suicidal ideas. The patient is not nervous/anxious.     Vital Signs: BP 120/80   Pulse 86   Temp 97.8 F (36.6 C)   Resp 16   Ht '5\' 5"'$  (1.651 m)   Wt 217 lb (98.4 kg)   LMP 12/21/2015 (Approximate)   SpO2 96%   BMI 36.11 kg/m    Physical Exam Vitals reviewed.  Constitutional:      General: She is not in acute distress.    Appearance: Normal appearance. She is obese. She is not ill-appearing.  HENT:     Head: Normocephalic and atraumatic.  Eyes:     Pupils: Pupils are equal, round, and reactive to light.  Cardiovascular:     Rate and Rhythm: Normal rate and regular rhythm.  Pulmonary:     Effort: Pulmonary effort is normal. No respiratory distress.  Neurological:     Mental Status: She is alert and oriented to person, place, and time.  Psychiatric:        Mood and Affect: Mood normal.        Behavior: Behavior normal.        Assessment/Plan: 1. Gastroesophageal reflux disease without  esophagitis Omeprazole dose increased to 40 mg, instructed patient to start with the 40 mg dose in the morning and then she can add the 40 mg dose in the evening if she feels like she needs it.  Prescription was adjusted to allow for her to increase to taking 40 mg twice daily if necessary. - omeprazole (PRILOSEC) 40 MG capsule; Take 1 capsule (40 mg total) by mouth in the morning and at bedtime. May start with once daily and work up to twice daily.  Dispense: 180 capsule; Refill: 1  2. Arthralgia of multiple joints Current prescriptions for gabapentin are effective at their current dose and frequency.  Patient does have refills at this time so no additional refills are ordered today.  3. Herniated nucleus pulposus, lumbar See problem #2   General Counseling: Aricka verbalizes understanding of the findings of todays visit and agrees with plan of treatment. I have discussed any further diagnostic evaluation that may be needed or ordered today. We also reviewed her medications today. she has been encouraged to call the office with any questions or concerns that should arise related to todays visit.    No orders of the defined types were placed in this encounter.   Meds ordered this encounter  Medications   omeprazole (PRILOSEC) 40 MG capsule    Sig: Take 1 capsule (40 mg total) by mouth in the morning and at bedtime. May start with once daily and work up to twice daily.    Dispense:  180 capsule    Refill:  1    Note increased dose, please fill new prescription asap    Return for previously scheduled, F/U, Chyrel Taha PCP in september. .   Total time spent:30 Minutes Time spent includes review of chart, medications, test results, and follow up plan with the patient.   [place] Controlled Substance Database was reviewed by me.  This patient was seen by Jonetta Osgood, FNP-C in collaboration with Dr. Clayborn Bigness as a part of collaborative care agreement.   Fran Mcree R. Valetta Fuller, MSN,  FNP-C  Internal medicine

## 2022-03-03 ENCOUNTER — Encounter: Payer: Self-pay | Admitting: Nurse Practitioner

## 2022-03-17 ENCOUNTER — Other Ambulatory Visit: Payer: Self-pay | Admitting: Obstetrics and Gynecology

## 2022-03-17 ENCOUNTER — Encounter: Payer: Self-pay | Admitting: Obstetrics and Gynecology

## 2022-03-17 ENCOUNTER — Other Ambulatory Visit (HOSPITAL_COMMUNITY)
Admission: RE | Admit: 2022-03-17 | Discharge: 2022-03-17 | Disposition: A | Discharge status: Home / Self Care | Payer: BC Managed Care – PPO | Source: Ambulatory Visit | Attending: Obstetrics and Gynecology | Admitting: Obstetrics and Gynecology

## 2022-03-17 ENCOUNTER — Ambulatory Visit (INDEPENDENT_AMBULATORY_CARE_PROVIDER_SITE_OTHER): Payer: BC Managed Care – PPO | Admitting: Obstetrics and Gynecology

## 2022-03-17 VITALS — BP 111/76 | HR 76 | Ht 65.0 in | Wt 221.3 lb

## 2022-03-17 DIAGNOSIS — Z124 Encounter for screening for malignant neoplasm of cervix: Secondary | ICD-10-CM

## 2022-03-17 DIAGNOSIS — N816 Rectocele: Secondary | ICD-10-CM | POA: Diagnosis not present

## 2022-03-17 DIAGNOSIS — Z01419 Encounter for gynecological examination (general) (routine) without abnormal findings: Secondary | ICD-10-CM

## 2022-03-17 DIAGNOSIS — Z1231 Encounter for screening mammogram for malignant neoplasm of breast: Secondary | ICD-10-CM | POA: Diagnosis not present

## 2022-03-17 NOTE — Progress Notes (Signed)
Patients presents for annual exam today. She states doing well, reports hot flashes. Patient is due for pap smear, ordered. Patient is due for mammogram, ordered. Patients annual labs declined at this time. Patient states no other questions or concerns at this time.

## 2022-03-19 LAB — CYTOLOGY - PAP
Comment: NEGATIVE
Diagnosis: NEGATIVE
High risk HPV: NEGATIVE

## 2022-03-20 ENCOUNTER — Other Ambulatory Visit: Payer: Self-pay | Admitting: Nurse Practitioner

## 2022-03-20 DIAGNOSIS — F321 Major depressive disorder, single episode, moderate: Secondary | ICD-10-CM

## 2022-03-24 ENCOUNTER — Encounter: Payer: Self-pay | Admitting: Nurse Practitioner

## 2022-03-31 ENCOUNTER — Encounter: Payer: Self-pay | Admitting: Nurse Practitioner

## 2022-05-13 ENCOUNTER — Other Ambulatory Visit: Payer: Self-pay | Admitting: Nurse Practitioner

## 2022-05-13 DIAGNOSIS — R21 Rash and other nonspecific skin eruption: Secondary | ICD-10-CM

## 2022-05-13 DIAGNOSIS — M2550 Pain in unspecified joint: Secondary | ICD-10-CM

## 2022-05-13 DIAGNOSIS — F321 Major depressive disorder, single episode, moderate: Secondary | ICD-10-CM

## 2022-05-13 DIAGNOSIS — J309 Allergic rhinitis, unspecified: Secondary | ICD-10-CM

## 2022-05-19 ENCOUNTER — Other Ambulatory Visit: Payer: BC Managed Care – PPO

## 2022-05-27 ENCOUNTER — Ambulatory Visit: Payer: BC Managed Care – PPO | Admitting: Nurse Practitioner

## 2022-06-09 ENCOUNTER — Ambulatory Visit
Admission: RE | Admit: 2022-06-09 | Discharge: 2022-06-09 | Disposition: A | Discharge status: Home / Self Care | Payer: BC Managed Care – PPO | Source: Ambulatory Visit | Attending: Nurse Practitioner | Admitting: Nurse Practitioner

## 2022-06-09 ENCOUNTER — Ambulatory Visit
Admission: RE | Admit: 2022-06-09 | Discharge: 2022-06-09 | Disposition: A | Discharge status: Home / Self Care | Payer: BC Managed Care – PPO | Source: Ambulatory Visit | Attending: Obstetrics and Gynecology | Admitting: Obstetrics and Gynecology

## 2022-06-09 DIAGNOSIS — Z780 Asymptomatic menopausal state: Secondary | ICD-10-CM | POA: Diagnosis present

## 2022-06-09 DIAGNOSIS — Z1231 Encounter for screening mammogram for malignant neoplasm of breast: Secondary | ICD-10-CM | POA: Insufficient documentation

## 2022-06-14 ENCOUNTER — Other Ambulatory Visit: Payer: Self-pay | Admitting: Nurse Practitioner

## 2022-06-14 DIAGNOSIS — J309 Allergic rhinitis, unspecified: Secondary | ICD-10-CM

## 2022-06-16 ENCOUNTER — Encounter: Payer: Self-pay | Admitting: Nurse Practitioner

## 2022-06-16 ENCOUNTER — Ambulatory Visit: Payer: BC Managed Care – PPO | Admitting: Nurse Practitioner

## 2022-06-16 VITALS — BP 121/67 | HR 67 | Temp 97.0°F | Resp 16 | Ht 65.0 in | Wt 224.2 lb

## 2022-06-16 DIAGNOSIS — M792 Neuralgia and neuritis, unspecified: Secondary | ICD-10-CM

## 2022-06-16 DIAGNOSIS — K219 Gastro-esophageal reflux disease without esophagitis: Secondary | ICD-10-CM

## 2022-06-16 DIAGNOSIS — G8929 Other chronic pain: Secondary | ICD-10-CM | POA: Diagnosis not present

## 2022-06-16 DIAGNOSIS — Z760 Encounter for issue of repeat prescription: Secondary | ICD-10-CM

## 2022-06-16 MED ORDER — OMEPRAZOLE 40 MG PO CPDR: 40.0000 mg | DELAYED_RELEASE_CAPSULE | Freq: Two times a day (BID) | ORAL | 1 refills | Status: DC

## 2022-06-16 MED ORDER — GABAPENTIN 100 MG PO CAPS
100.0000 mg | ORAL_CAPSULE | Freq: Two times a day (BID) | ORAL | 1 refills | Status: DC
Start: 2022-06-16 — End: 2022-12-04

## 2022-06-16 MED ORDER — LINACLOTIDE 145 MCG PO CAPS: ORAL_CAPSULE | ORAL | 1 refills | Status: DC

## 2022-06-16 MED ORDER — LEVOTHYROXINE SODIUM 100 MCG PO TABS: 100.0000 ug | ORAL_TABLET | Freq: Every day | ORAL | 1 refills | Status: DC

## 2022-06-16 MED ORDER — GABAPENTIN 600 MG PO TABS: ORAL_TABLET | ORAL | 1 refills | Status: DC

## 2022-06-16 NOTE — Progress Notes (Signed)
[facility] [place], [place] [address]  Internal MEDICINE  Office Visit Note  Patient Name: Sherry Evans  [id number]  [id number]  Date of Service: 06/16/2022  Chief Complaint  Patient presents with   Follow-up    Follow up med refill and discuss bmd   Depression   Gastroesophageal Reflux    HPI Sherry Evans presents for a follow up visit for neuropathy, GERD, arthritis and review BMD results.  Neuropathy --gabapentin dose working well, refills DEXA scan -- ap spine normal, left femur neck -1.8 osteopenia GERD -- controlled with current dose of omeprazole.  Arthritis -- pain is manageable.     Current Medication: Outpatient Encounter Medications as of 06/16/2022  Medication Sig   budesonide (PULMICORT) 0.5 MG/2ML nebulizer solution Take by nebulization 2 (two) times daily.   Cholecalciferol 50 MCG (2000 UT) TABS Take 2,000 Units by mouth daily.   ketoconazole (NIZORAL) 2 % cream Apply 1 application topically daily.   levocetirizine (XYZAL) 5 MG tablet Take 5 mg by mouth daily.   meloxicam (MOBIC) 15 MG tablet TAKE 1 TABLET (15 MG TOTAL) BY MOUTH DAILY.   montelukast (SINGULAIR) 10 MG tablet TAKE 1 TABLET BY MOUTH DAILY FOR ALLERGIES   Multiple Vitamins-Calcium (VIACTIV MULTI-VITAMIN) CHEW Chew 1 tablet by mouth daily.   nystatin-triamcinolone ointment (MYCOLOG) APPLY 1 APPLICATION TOPICALLY 2 (TWO) TIMES DAILY AS NEEDED.   Probiotic Product (PROBIOTIC-10 PO) Take by mouth.   sertraline (ZOLOFT) 50 MG tablet TAKE 1 TABLET BY MOUTH EVERY DAY   traZODone (DESYREL) 50 MG tablet TAKE 1 TABLET (50 MG TOTAL) BY MOUTH AT BEDTIME AS NEEDED.   [DISCONTINUED] gabapentin (NEURONTIN) 100 MG capsule Take 1 capsule (100 mg total) by mouth 2 (two) times daily with breakfast and lunch.   [DISCONTINUED] gabapentin (NEURONTIN) 600 MG tablet TAKE 3 TABLETS BY MOUTH AT EVENING MEAL   [DISCONTINUED] levothyroxine (SYNTHROID) 100 MCG tablet Take 1 tablet (100 mcg total) by  mouth daily.   [DISCONTINUED] LINZESS 145 MCG CAPS capsule TAKE 1 CAPSULE BY MOUTH DAILY BEFORE BREAKFAST.   [DISCONTINUED] omeprazole (PRILOSEC) 20 MG capsule TAKE 2 CAPSULES BY MOUTH EVERY DAY   [DISCONTINUED] omeprazole (PRILOSEC) 40 MG capsule Take 1 capsule (40 mg total) by mouth in the morning and at bedtime. May start with once daily and work up to twice daily.   gabapentin (NEURONTIN) 100 MG capsule Take 1 capsule (100 mg total) by mouth 2 (two) times daily with breakfast and lunch.   gabapentin (NEURONTIN) 600 MG tablet TAKE 3 TABLETS BY MOUTH AT EVENING MEAL   levothyroxine (SYNTHROID) 100 MCG tablet Take 1 tablet (100 mcg total) by mouth daily.   linaclotide (LINZESS) 145 MCG CAPS capsule TAKE 1 CAPSULE BY MOUTH DAILY BEFORE BREAKFAST.   omeprazole (PRILOSEC) 40 MG capsule Take 1 capsule (40 mg total) by mouth in the morning and at bedtime. May start with once daily and work up to twice daily.   No facility-administered encounter medications on file as of 06/16/2022.    Surgical History: Past Surgical History:  Procedure Laterality Date   adnoids removed  1969   BACK SURGERY  2008   BICEPT TENODESIS Right 04/02/2016   Procedure: BICEPS TENODESIS;  Surgeon: Corky Mull, MD;  Location: [facility] OR[facility];  Service: Orthopedics;  Laterality: Right;   BLADDER SUSPENSION  2009   BREAST BIOPSY Left 1990's   benign   BREAST BIOPSY Left 10/28/2015   SMALL FRAGMENTS OF LYMPHOID TISSUE   CHOLECYSTECTOMY  COLONOSCOPY     DILATION AND CURETTAGE OF UTERUS     GASTRIC RESTRICTION SURGERY  08/23/2015   HERNIA REPAIR  2016   hiatal   JOINT REPLACEMENT Bilateral 2014   Partial Knee Replacement,  Surgeon: Dr. Little Ishikawa, Brooke Bonito. [facility]   KNEE SURGERY  2014   SHOULDER ARTHROSCOPY WITH DEBRIDEMENT AND BICEP TENDON REPAIR Right 04/02/2016   Procedure: SHOULDER ARTHROSCOPY WITH DEBRIDEMENT ;  Surgeon: Corky Mull, MD;  Location: [facility] OR[facility];  Service: Orthopedics;  Laterality: Right;   SHOULDER  ARTHROSCOPY WITH OPEN ROTATOR CUFF REPAIR Right 04/02/2016   Procedure: SHOULDER ARTHROSCOPY WITH OPEN ROTATOR CUFF REPAIR;  Surgeon: Corky Mull, MD;  Location: [facility];  Service: Orthopedics;  Laterality: Right;   SHOULDER ARTHROSCOPY WITH SUBACROMIAL DECOMPRESSION Right 04/02/2016   Procedure: SHOULDER ARTHROSCOPY WITH SUBACROMIAL DECOMPRESSION;  Surgeon: Corky Mull, MD;  Location: [facility];  Service: Orthopedics;  Laterality: Right;   TENDON RELEASE Right 08/2021    Medical History: Past Medical History:  Diagnosis Date   Anxiety    Depression    GERD (gastroesophageal reflux disease)    History of sinus surgery    3 times   Hypothyroidism    Sleep apnea    use C-PAP   Trigger finger     Family History: Family History  Problem Relation Age of Onset   Hypertension Father    Diabetes Father    Breast cancer Maternal Aunt        great aunts. 70-80   Breast cancer Maternal Grandmother 70       metatastic to breast   Ovarian cancer Maternal Grandmother    Breast cancer Paternal Grandmother        55's   Colon cancer Neg Hx     Social History   Socioeconomic History   Marital status: Married    Spouse name: Not on file   Number of children: Not on file   Years of education: Not on file   Highest education level: Not on file  Occupational History   Not on file  Tobacco Use   Smoking status: Never   Smokeless tobacco: Never  Vaping Use   Vaping Use: Never used  Substance and Sexual Activity   Alcohol use: No    Alcohol/week: 0.0 standard drinks of alcohol   Drug use: No   Sexual activity: Yes    Birth control/protection: Post-menopausal  Other Topics Concern   Not on file  Social History Narrative   Not on file   Social Determinants of Health   Financial Resource Strain: Not on file  Food Insecurity: Not on file  Transportation Needs: Not on file  Physical Activity: Inactive (12/08/2017)   Exercise Vital Sign    Days of Exercise per Week: 0 days     Minutes of Exercise per Session: 0 min  Stress: Not on file  Social Connections: Not on file  Intimate Partner Violence: Not on file      Review of Systems  Constitutional:  Negative for chills, fatigue and unexpected weight change.  HENT:  Negative for congestion, rhinorrhea, sneezing and sore throat.   Eyes:  Negative for redness.  Respiratory: Negative.  Negative for cough, chest tightness, shortness of breath and wheezing.   Cardiovascular: Negative.  Negative for chest pain and palpitations.  Gastrointestinal:  Negative for abdominal pain, constipation, diarrhea, nausea and vomiting.  Genitourinary:  Negative for dysuria and frequency.  Musculoskeletal:  Positive for arthralgias, back pain, joint swelling  and myalgias. Negative for neck pain.  Neurological: Negative.  Negative for tremors and numbness.  Hematological:  Negative for adenopathy. Does not bruise/bleed easily.  Psychiatric/Behavioral:  Negative for behavioral problems (Depression), sleep disturbance and suicidal ideas. The patient is not nervous/anxious.     Vital Signs: BP 121/67   Pulse 67   Temp (!) 97 F (36.1 C)   Resp 16   Ht '5\' 5"'$  (1.651 m)   Wt 224 lb 3.2 oz (101.7 kg)   LMP 12/21/2015 (Approximate)   SpO2 97%   BMI 37.31 kg/m    Physical Exam Vitals reviewed.  Constitutional:      General: She is not in acute distress.    Appearance: Normal appearance. She is obese. She is not ill-appearing.  HENT:     Head: Normocephalic and atraumatic.  Eyes:     Pupils: Pupils are equal, round, and reactive to light.  Cardiovascular:     Rate and Rhythm: Normal rate and regular rhythm.  Pulmonary:     Effort: Pulmonary effort is normal. No respiratory distress.  Neurological:     Mental Status: She is alert and oriented to person, place, and time.  Psychiatric:        Mood and Affect: Mood normal.        Behavior: Behavior normal.        Assessment/Plan: 1. Chronic peripheral neuropathic  pain Neuropathy is improved and manageable with current gabapentin dose; continue gabapentin as prescribed.  - gabapentin (NEURONTIN) 600 MG tablet; TAKE 3 TABLETS BY MOUTH AT EVENING MEAL  Dispense: 270 tablet; Refill: 1 - gabapentin (NEURONTIN) 100 MG capsule; Take 1 capsule (100 mg total) by mouth 2 (two) times daily with breakfast and lunch.  Dispense: 180 capsule; Refill: 1  2. Gastroesophageal reflux disease without esophagitis Stable with omeprazole 40 mg twice daily, continue as prescribed.   3. Medication refill - gabapentin (NEURONTIN) 600 MG tablet; TAKE 3 TABLETS BY MOUTH AT EVENING MEAL  Dispense: 270 tablet; Refill: 1 - gabapentin (NEURONTIN) 100 MG capsule; Take 1 capsule (100 mg total) by mouth 2 (two) times daily with breakfast and lunch.  Dispense: 180 capsule; Refill: 1 - levothyroxine (SYNTHROID) 100 MCG tablet; Take 1 tablet (100 mcg total) by mouth daily.  Dispense: 90 tablet; Refill: 1 - omeprazole (PRILOSEC) 40 MG capsule; Take 1 capsule (40 mg total) by mouth in the morning and at bedtime. May start with once daily and work up to twice daily.  Dispense: 180 capsule; Refill: 1 - linaclotide (LINZESS) 145 MCG CAPS capsule; TAKE 1 CAPSULE BY MOUTH DAILY BEFORE BREAKFAST.  Dispense: 90 capsule; Refill: 1   General Counseling: Sherry Evans verbalizes understanding of the findings of todays visit and agrees with plan of treatment. I have discussed any further diagnostic evaluation that may be needed or ordered today. We also reviewed her medications today. she has been encouraged to call the office with any questions or concerns that should arise related to todays visit.    No orders of the defined types were placed in this encounter.   Meds ordered this encounter  Medications   gabapentin (NEURONTIN) 600 MG tablet    Sig: TAKE 3 TABLETS BY MOUTH AT EVENING MEAL    Dispense:  270 tablet    Refill:  1    For future refills   gabapentin (NEURONTIN) 100 MG capsule    Sig: Take  1 capsule (100 mg total) by mouth 2 (two) times daily with breakfast and lunch.  Dispense:  180 capsule    Refill:  1    For future refills   levothyroxine (SYNTHROID) 100 MCG tablet    Sig: Take 1 tablet (100 mcg total) by mouth daily.    Dispense:  90 tablet    Refill:  1    For future refills   omeprazole (PRILOSEC) 40 MG capsule    Sig: Take 1 capsule (40 mg total) by mouth in the morning and at bedtime. May start with once daily and work up to twice daily.    Dispense:  180 capsule    Refill:  1    Please discontinue all previous orders for omeprazole, patient is take 40 mg twice daily, not 20 mg. Please wait until she is due for next refill of omeprazole '40mg'$    linaclotide (LINZESS) 145 MCG CAPS capsule    Sig: TAKE 1 CAPSULE BY MOUTH DAILY BEFORE BREAKFAST.    Dispense:  90 capsule    Refill:  1    For future refills, do not send now, she just got some.    Return for previously scheduled, CPE, Vibhav Waddill PCP in march.   Total time spent:30 Minutes Time spent includes review of chart, medications, test results, and follow up plan with the patient.   [place] Controlled Substance Database was reviewed by me.  This patient was seen by Jonetta Osgood, FNP-C in collaboration with Dr. Clayborn Bigness as a part of collaborative care agreement.   Ymani Porcher R. Valetta Fuller, MSN, FNP-C Internal medicine

## 2022-08-24 ENCOUNTER — Other Ambulatory Visit: Payer: Self-pay | Admitting: Nurse Practitioner

## 2022-08-24 DIAGNOSIS — Z760 Encounter for issue of repeat prescription: Secondary | ICD-10-CM

## 2022-08-24 DIAGNOSIS — G8929 Other chronic pain: Secondary | ICD-10-CM

## 2022-11-06 ENCOUNTER — Encounter: Payer: Self-pay | Admitting: Nurse Practitioner

## 2022-11-06 NOTE — Telephone Encounter (Signed)
Pt advised to go to urgent care

## 2022-11-12 ENCOUNTER — Other Ambulatory Visit: Payer: Self-pay | Admitting: Internal Medicine

## 2022-11-12 DIAGNOSIS — F321 Major depressive disorder, single episode, moderate: Secondary | ICD-10-CM

## 2022-11-12 DIAGNOSIS — J309 Allergic rhinitis, unspecified: Secondary | ICD-10-CM

## 2022-11-21 ENCOUNTER — Telehealth: Payer: BC Managed Care – PPO | Admitting: Family Medicine

## 2022-11-21 DIAGNOSIS — J019 Acute sinusitis, unspecified: Secondary | ICD-10-CM | POA: Diagnosis not present

## 2022-11-21 DIAGNOSIS — B9689 Other specified bacterial agents as the cause of diseases classified elsewhere: Secondary | ICD-10-CM

## 2022-11-21 MED ORDER — AMOXICILLIN-POT CLAVULANATE 875-125 MG PO TABS: 1.0000 | ORAL_TABLET | Freq: Two times a day (BID) | ORAL | 0 refills | Status: DC

## 2022-11-21 NOTE — Progress Notes (Signed)
E-Visit for Sinus Problems  We are sorry that you are not feeling well.  Here is how we plan to help!  Based on what you have shared with me it looks like you have sinusitis.  Sinusitis is inflammation and infection in the sinus cavities of the head.  Based on your presentation I believe you most likely have Acute Bacterial Sinusitis.  This is an infection caused by bacteria and is treated with antibiotics. I have prescribed Augmentin 875mg /125mg  one tablet twice daily with food, for 7 days. You may use an oral decongestant such as Mucinex D or if you have glaucoma or high blood pressure use plain Mucinex. Saline nasal spray help and can safely be used as often as needed for congestion.  If you develop worsening sinus pain, fever or notice severe headache and vision changes, or if symptoms are not better after completion of antibiotic, please schedule an appointment with a health care provider.    Sinus infections are not as easily transmitted as other respiratory infection, however we still recommend that you avoid close contact with loved ones, especially the very young and elderly.  Remember to wash your hands thoroughly throughout the day as this is the number one way to prevent the spread of infection!  Home Care: Only take medications as instructed by your medical team. Complete the entire course of an antibiotic. Do not take these medications with alcohol. A steam or ultrasonic humidifier can help congestion.  You can place a towel over your head and breathe in the steam from hot water coming from a faucet. Avoid close contacts especially the very young and the elderly. Cover your mouth when you cough or sneeze. Always remember to wash your hands.  Get Help Right Away If: You develop worsening fever or sinus pain. You develop a severe head ache or visual changes. Your symptoms persist after you have completed your treatment plan.  Make sure you Understand these instructions. Will watch  your condition. Will get help right away if you are not doing well or get worse.  Thank you for choosing an e-visit.  Your e-visit answers were reviewed by a board certified advanced clinical practitioner to complete your personal care plan. Depending upon the condition, your plan could have included both over the counter or prescription medications.  Please review your pharmacy choice. Make sure the pharmacy is open so you can pick up prescription now. If there is a problem, you may contact your provider through [employer] and have the prescription routed to another pharmacy.  Your safety is important to [place]. If you have drug allergies check your prescription carefully.   For the next 24 hours you can use MyChart to ask questions about today's visit, request a non-urgent call back, or ask for a work or school excuse. You will get an email in the next two days asking about your experience. I hope that your e-visit has been valuable and will speed your recovery.   I have provided 5 minutes of non face to face time during this encounter for chart review and documentation.

## 2022-11-30 ENCOUNTER — Encounter: Payer: BC Managed Care – PPO | Admitting: Nurse Practitioner

## 2022-12-04 ENCOUNTER — Encounter: Payer: Self-pay | Admitting: Nurse Practitioner

## 2022-12-04 ENCOUNTER — Ambulatory Visit (INDEPENDENT_AMBULATORY_CARE_PROVIDER_SITE_OTHER): Payer: BC Managed Care – PPO | Admitting: Nurse Practitioner

## 2022-12-04 VITALS — BP 114/65 | HR 81 | Temp 97.9°F | Resp 16 | Ht 65.0 in | Wt 228.4 lb

## 2022-12-04 DIAGNOSIS — E559 Vitamin D deficiency, unspecified: Secondary | ICD-10-CM | POA: Diagnosis not present

## 2022-12-04 DIAGNOSIS — Z0001 Encounter for general adult medical examination with abnormal findings: Secondary | ICD-10-CM | POA: Diagnosis not present

## 2022-12-04 DIAGNOSIS — E538 Deficiency of other specified B group vitamins: Secondary | ICD-10-CM

## 2022-12-04 DIAGNOSIS — E782 Mixed hyperlipidemia: Secondary | ICD-10-CM

## 2022-12-04 DIAGNOSIS — E039 Hypothyroidism, unspecified: Secondary | ICD-10-CM | POA: Diagnosis not present

## 2022-12-04 DIAGNOSIS — Z79899 Other long term (current) drug therapy: Secondary | ICD-10-CM

## 2022-12-04 MED ORDER — MELOXICAM 15 MG PO TABS: 15.0000 mg | ORAL_TABLET | Freq: Every day | ORAL | 1 refills | Status: DC

## 2022-12-04 MED ORDER — TRAMADOL HCL 50 MG PO TABS: 50.0000 mg | ORAL_TABLET | Freq: Four times a day (QID) | ORAL | 0 refills | Status: AC | PRN

## 2022-12-04 MED ORDER — GABAPENTIN 100 MG PO CAPS: 100.0000 mg | ORAL_CAPSULE | Freq: Two times a day (BID) | ORAL | 1 refills | Status: DC

## 2022-12-04 MED ORDER — LEVOCETIRIZINE DIHYDROCHLORIDE 5 MG PO TABS: 5.0000 mg | ORAL_TABLET | Freq: Every day | ORAL | 1 refills | Status: DC

## 2022-12-04 MED ORDER — LINACLOTIDE 145 MCG PO CAPS: ORAL_CAPSULE | ORAL | 1 refills | Status: DC

## 2022-12-04 MED ORDER — NYSTATIN-TRIAMCINOLONE 100000-0.1 UNIT/GM-% EX OINT: TOPICAL_OINTMENT | Freq: Two times a day (BID) | CUTANEOUS | 3 refills | Status: DC | PRN

## 2022-12-04 MED ORDER — LEVOTHYROXINE SODIUM 100 MCG PO TABS: 100.0000 ug | ORAL_TABLET | Freq: Every day | ORAL | 1 refills | Status: DC

## 2022-12-04 NOTE — Progress Notes (Signed)
[facility] [place], [place] [address]  Internal MEDICINE  Office Visit Note  Patient Name: Sherry Evans  [id number]  NH:[id number]  Date of Service: 12/04/2022  Chief Complaint  Patient presents with   Annual Exam   Depression   Gastroesophageal Reflux    HPI Sherry Evans presents for an annual well visit and physical exam.  Well-appearing 63 y.o. female with allergic rhinitis, anxiety, GERD and hypothyroidism Routine CRC screening: due in 2025 Routine mammogram: done in September 2023 Pap smear: done in June 2023 Labs: due for routine labs  New or worsening pain: none Other concerns: none No significant changes since her last visit.     Current Medication: Outpatient Encounter Medications as of 12/04/2022  Medication Sig   budesonide (PULMICORT) 0.5 MG/2ML nebulizer solution Take by nebulization 2 (two) times daily.   Cholecalciferol 50 MCG (2000 UT) TABS Take 2,000 Units by mouth daily.   gabapentin (NEURONTIN) 600 MG tablet TAKE 2 TABLETS BY MOUTH AT EVENING MEAL   ketoconazole (NIZORAL) 2 % cream Apply 1 application topically daily.   montelukast (SINGULAIR) 10 MG tablet TAKE 1 TABLET BY MOUTH DAILY FOR ALLERGIES   Multiple Vitamins-Calcium (VIACTIV MULTI-VITAMIN) CHEW Chew 1 tablet by mouth daily.   Probiotic Product (PROBIOTIC-10 PO) Take by mouth.   sertraline (ZOLOFT) 50 MG tablet TAKE 1 TABLET BY MOUTH EVERY DAY   traMADol (ULTRAM) 50 MG tablet Take 1 tablet (50 mg total) by mouth every 6 (six) hours as needed for up to 5 days.   traZODone (DESYREL) 50 MG tablet TAKE 1 TABLET (50 MG TOTAL) BY MOUTH AT BEDTIME AS NEEDED.   [DISCONTINUED] amoxicillin-clavulanate (AUGMENTIN) 875-125 MG tablet Take 1 tablet by mouth 2 (two) times daily.   [DISCONTINUED] gabapentin (NEURONTIN) 100 MG capsule Take 1 capsule (100 mg total) by mouth 2 (two) times daily with breakfast and lunch.   [DISCONTINUED] levocetirizine (XYZAL) 5 MG tablet Take 5 mg by mouth  daily.   [DISCONTINUED] levothyroxine (SYNTHROID) 100 MCG tablet Take 1 tablet (100 mcg total) by mouth daily.   [DISCONTINUED] linaclotide (LINZESS) 145 MCG CAPS capsule TAKE 1 CAPSULE BY MOUTH DAILY BEFORE BREAKFAST.   [DISCONTINUED] meloxicam (MOBIC) 15 MG tablet TAKE 1 TABLET (15 MG TOTAL) BY MOUTH DAILY.   [DISCONTINUED] nystatin-triamcinolone ointment (MYCOLOG) APPLY 1 APPLICATION TOPICALLY 2 (TWO) TIMES DAILY AS NEEDED.   [DISCONTINUED] omeprazole (PRILOSEC) 40 MG capsule Take 1 capsule (40 mg total) by mouth in the morning and at bedtime. May start with once daily and work up to twice daily.   gabapentin (NEURONTIN) 100 MG capsule Take 1 capsule (100 mg total) by mouth 2 (two) times daily with breakfast and lunch.   levocetirizine (XYZAL) 5 MG tablet Take 1 tablet (5 mg total) by mouth daily.   levothyroxine (SYNTHROID) 100 MCG tablet Take 1 tablet (100 mcg total) by mouth daily.   linaclotide (LINZESS) 145 MCG CAPS capsule TAKE 1 CAPSULE BY MOUTH DAILY BEFORE BREAKFAST.   meloxicam (MOBIC) 15 MG tablet Take 1 tablet (15 mg total) by mouth daily.   nystatin-triamcinolone ointment (MYCOLOG) Apply topically 2 (two) times daily as needed.   No facility-administered encounter medications on file as of 12/04/2022.    Surgical History: Past Surgical History:  Procedure Laterality Date   adnoids removed  1969   BACK SURGERY  2008   BICEPT TENODESIS Right 04/02/2016   Procedure: BICEPS TENODESIS;  Surgeon: Corky Mull, MD;  Location: [facility];  Service: Orthopedics;  Laterality: Right;  BLADDER SUSPENSION  2009   BREAST BIOPSY Left 1990's   benign   BREAST BIOPSY Left 10/28/2015   SMALL FRAGMENTS OF LYMPHOID TISSUE   CHOLECYSTECTOMY     COLONOSCOPY     DILATION AND CURETTAGE OF UTERUS     GASTRIC RESTRICTION SURGERY  08/23/2015   HERNIA REPAIR  2016   hiatal   JOINT REPLACEMENT Bilateral 2014   Partial Knee Replacement,  Surgeon: Dr. Little Ishikawa, Brooke Bonito. [facility]   KNEE SURGERY   2014   SHOULDER ARTHROSCOPY WITH DEBRIDEMENT AND BICEP TENDON REPAIR Right 04/02/2016   Procedure: SHOULDER ARTHROSCOPY WITH DEBRIDEMENT ;  Surgeon: Corky Mull, MD;  Location: [facility];  Service: Orthopedics;  Laterality: Right;   SHOULDER ARTHROSCOPY WITH OPEN ROTATOR CUFF REPAIR Right 04/02/2016   Procedure: SHOULDER ARTHROSCOPY WITH OPEN ROTATOR CUFF REPAIR;  Surgeon: Corky Mull, MD;  Location: [facility];  Service: Orthopedics;  Laterality: Right;   SHOULDER ARTHROSCOPY WITH SUBACROMIAL DECOMPRESSION Right 04/02/2016   Procedure: SHOULDER ARTHROSCOPY WITH SUBACROMIAL DECOMPRESSION;  Surgeon: Corky Mull, MD;  Location: [facility];  Service: Orthopedics;  Laterality: Right;   TENDON RELEASE Right 08/2021    Medical History: Past Medical History:  Diagnosis Date   Anxiety    Depression    GERD (gastroesophageal reflux disease)    History of sinus surgery    3 times   Hypothyroidism    Sleep apnea    use C-PAP   Trigger finger     Family History: Family History  Problem Relation Age of Onset   Hypertension Father    Diabetes Father    Breast cancer Maternal Aunt        great aunts. 70-80   Breast cancer Maternal Grandmother 70       metatastic to breast   Ovarian cancer Maternal Grandmother    Breast cancer Paternal Grandmother        16's   Colon cancer Neg Hx     Social History   Socioeconomic History   Marital status: Married    Spouse name: Not on file   Number of children: Not on file   Years of education: Not on file   Highest education level: Not on file  Occupational History   Not on file  Tobacco Use   Smoking status: Never   Smokeless tobacco: Never  Vaping Use   Vaping Use: Never used  Substance and Sexual Activity   Alcohol use: No    Alcohol/week: 0.0 standard drinks of alcohol   Drug use: No   Sexual activity: Yes    Birth control/protection: Post-menopausal  Other Topics Concern   Not on file  Social History Narrative   Not on file    Social Determinants of Health   Financial Resource Strain: Not on file  Food Insecurity: Not on file  Transportation Needs: Not on file  Physical Activity: Inactive (12/08/2017)   Exercise Vital Sign    Days of Exercise per Week: 0 days    Minutes of Exercise per Session: 0 min  Stress: Not on file  Social Connections: Not on file  Intimate Partner Violence: Not on file      Review of Systems  Constitutional:  Negative for activity change, appetite change, chills, fatigue, fever and unexpected weight change.  HENT: Negative.  Negative for congestion, ear pain, rhinorrhea, sore throat and trouble swallowing.   Eyes: Negative.   Respiratory: Negative.  Negative for cough, chest tightness, shortness of breath and wheezing.  Cardiovascular: Negative.  Negative for chest pain and palpitations.  Gastrointestinal: Negative.  Negative for abdominal pain, blood in stool, constipation, diarrhea, nausea and vomiting.  Endocrine: Negative.   Genitourinary: Negative.  Negative for difficulty urinating, dysuria, frequency, hematuria and urgency.  Musculoskeletal: Negative.  Negative for arthralgias, back pain, joint swelling, myalgias and neck pain.  Skin: Negative.  Negative for rash and wound.  Allergic/Immunologic: Negative.  Negative for immunocompromised state.  Neurological: Negative.  Negative for dizziness, seizures, numbness and headaches.  Psychiatric/Behavioral: Negative.  Negative for behavioral problems, self-injury and suicidal ideas. The patient is not nervous/anxious.     Vital Signs: BP 114/65   Pulse 81   Temp 97.9 F (36.6 C)   Resp 16   Ht 5\' 5"  (1.651 m)   Wt 228 lb 6.4 oz (103.6 kg)   LMP 12/21/2015 (Approximate)   SpO2 96%   BMI 38.01 kg/m    Physical Exam Vitals reviewed.  Constitutional:      General: She is awake. She is not in acute distress.    Appearance: Normal appearance. She is well-developed and well-groomed. She is obese. She is not  ill-appearing or diaphoretic.  HENT:     Head: Normocephalic and atraumatic.     Right Ear: Tympanic membrane, ear canal and external ear normal.     Left Ear: Tympanic membrane, ear canal and external ear normal.     Nose: Nose normal. No congestion or rhinorrhea.     Mouth/Throat:     Lips: Pink.     Mouth: Mucous membranes are moist.     Pharynx: Oropharynx is clear. Uvula midline. No oropharyngeal exudate or posterior oropharyngeal erythema.  Eyes:     General: Lids are normal. Vision grossly intact. Gaze aligned appropriately. No scleral icterus.       Right eye: No discharge.        Left eye: No discharge.     Extraocular Movements: Extraocular movements intact.     Conjunctiva/sclera: Conjunctivae normal.     Pupils: Pupils are equal, round, and reactive to light.     Funduscopic exam:    Right eye: Red reflex present.        Left eye: Red reflex present. Neck:     Thyroid: No thyromegaly.     Vascular: No JVD.     Trachea: Trachea and phonation normal. No tracheal deviation.  Cardiovascular:     Rate and Rhythm: Normal rate and regular rhythm.     Pulses: Normal pulses.     Heart sounds: Normal heart sounds, S1 normal and S2 normal. No murmur heard.    No friction rub. No gallop.  Pulmonary:     Effort: Pulmonary effort is normal. No accessory muscle usage or respiratory distress.     Breath sounds: Normal breath sounds and air entry. No stridor. No wheezing or rales.  Chest:     Chest wall: No tenderness.     Comments: Declined clinical breast exam.  Abdominal:     General: Bowel sounds are normal. There is no distension.     Palpations: Abdomen is soft. There is no shifting dullness, fluid wave, mass or pulsatile mass.     Tenderness: There is no abdominal tenderness. There is no guarding or rebound.  Musculoskeletal:        General: No tenderness or deformity. Normal range of motion.     Cervical back: Normal range of motion and neck supple.     Right lower leg:  No edema.  Left lower leg: No edema.  Lymphadenopathy:     Cervical: No cervical adenopathy.  Skin:    General: Skin is warm and dry.     Capillary Refill: Capillary refill takes less than 2 seconds.     Coloration: Skin is not pale.     Findings: No erythema or rash.  Neurological:     Mental Status: She is alert and oriented to person, place, and time.     Cranial Nerves: No cranial nerve deficit.     Motor: No abnormal muscle tone.     Coordination: Coordination normal.     Deep Tendon Reflexes: Reflexes are normal and symmetric.  Psychiatric:        Mood and Affect: Mood normal.        Behavior: Behavior normal. Behavior is cooperative.        Thought Content: Thought content normal.        Judgment: Judgment normal.        Assessment/Plan: 1. Encounter for routine adult health examination with abnormal findings Age-appropriate preventive screenings and vaccinations discussed, annual physical exam completed. Routine labs for health maintenance ordered, see below. PHM updated. Medication refills ordered. - CBC with Differential/Platelet - CMP14+EGFR - Lipid Profile - TSH + free T4 - Vitamin D (25 hydroxy) - B12 and Folate Panel - Iron, TIBC and Ferritin Panel - levothyroxine (SYNTHROID) 100 MCG tablet; Take 1 tablet (100 mcg total) by mouth daily.  Dispense: 90 tablet; Refill: 1 - gabapentin (NEURONTIN) 100 MG capsule; Take 1 capsule (100 mg total) by mouth 2 (two) times daily with breakfast and lunch.  Dispense: 180 capsule; Refill: 1 - nystatin-triamcinolone ointment (MYCOLOG); Apply topically 2 (two) times daily as needed.  Dispense: 30 g; Refill: 3 - meloxicam (MOBIC) 15 MG tablet; Take 1 tablet (15 mg total) by mouth daily.  Dispense: 90 tablet; Refill: 1 - linaclotide (LINZESS) 145 MCG CAPS capsule; TAKE 1 CAPSULE BY MOUTH DAILY BEFORE BREAKFAST.  Dispense: 90 capsule; Refill: 1 - levocetirizine (XYZAL) 5 MG tablet; Take 1 tablet (5 mg total) by mouth daily.   Dispense: 90 tablet; Refill: 1 - traMADol (ULTRAM) 50 MG tablet; Take 1 tablet (50 mg total) by mouth every 6 (six) hours as needed for up to 5 days.  Dispense: 20 tablet; Refill: 0  2. Acquired hypothyroidism Routine labs ordered - CMP14+EGFR - TSH + free T4  3. Mixed hyperlipidemia Routine labs ordered - Lipid Profile - TSH + free T4  4. B12 deficiency Routine labs ordered - CBC with Differential/Platelet - B12 and Folate Panel - Iron, TIBC and Ferritin Panel  5. Vitamin D deficiency Routine lab ordered - Vitamin D (25 hydroxy)  6. Encounter for medication review Reviewed medications with patient and updated list, refills ordered - levothyroxine (SYNTHROID) 100 MCG tablet; Take 1 tablet (100 mcg total) by mouth daily.  Dispense: 90 tablet; Refill: 1 - gabapentin (NEURONTIN) 100 MG capsule; Take 1 capsule (100 mg total) by mouth 2 (two) times daily with breakfast and lunch.  Dispense: 180 capsule; Refill: 1 - nystatin-triamcinolone ointment (MYCOLOG); Apply topically 2 (two) times daily as needed.  Dispense: 30 g; Refill: 3 - meloxicam (MOBIC) 15 MG tablet; Take 1 tablet (15 mg total) by mouth daily.  Dispense: 90 tablet; Refill: 1 - linaclotide (LINZESS) 145 MCG CAPS capsule; TAKE 1 CAPSULE BY MOUTH DAILY BEFORE BREAKFAST.  Dispense: 90 capsule; Refill: 1 - levocetirizine (XYZAL) 5 MG tablet; Take 1 tablet (5 mg total) by mouth daily.  Dispense:  90 tablet; Refill: 1 - traMADol (ULTRAM) 50 MG tablet; Take 1 tablet (50 mg total) by mouth every 6 (six) hours as needed for up to 5 days.  Dispense: 20 tablet; Refill: 0     General Counseling: Ji verbalizes understanding of the findings of todays visit and agrees with plan of treatment. I have discussed any further diagnostic evaluation that may be needed or ordered today. We also reviewed her medications today. she has been encouraged to call the office with any questions or concerns that should arise related to todays  visit.    Orders Placed This Encounter  Procedures   CBC with Differential/Platelet   CMP14+EGFR   Lipid Profile   TSH + free T4   Vitamin D (25 hydroxy)   B12 and Folate Panel   Iron, TIBC and Ferritin Panel    Meds ordered this encounter  Medications   levothyroxine (SYNTHROID) 100 MCG tablet    Sig: Take 1 tablet (100 mcg total) by mouth daily.    Dispense:  90 tablet    Refill:  1    For future refills   gabapentin (NEURONTIN) 100 MG capsule    Sig: Take 1 capsule (100 mg total) by mouth 2 (two) times daily with breakfast and lunch.    Dispense:  180 capsule    Refill:  1    For future refills   nystatin-triamcinolone ointment (MYCOLOG)    Sig: Apply topically 2 (two) times daily as needed.    Dispense:  30 g    Refill:  3   meloxicam (MOBIC) 15 MG tablet    Sig: Take 1 tablet (15 mg total) by mouth daily.    Dispense:  90 tablet    Refill:  1   linaclotide (LINZESS) 145 MCG CAPS capsule    Sig: TAKE 1 CAPSULE BY MOUTH DAILY BEFORE BREAKFAST.    Dispense:  90 capsule    Refill:  1    For future refills, do not send now, she just got some.   levocetirizine (XYZAL) 5 MG tablet    Sig: Take 1 tablet (5 mg total) by mouth daily.    Dispense:  90 tablet    Refill:  1   traMADol (ULTRAM) 50 MG tablet    Sig: Take 1 tablet (50 mg total) by mouth every 6 (six) hours as needed for up to 5 days.    Dispense:  20 tablet    Refill:  0    First fill, may request refill when out    Return in about 6 months (around 06/06/2023) for F/U, Tiarna Koppen PCP.   Total time spent:30 Minutes Time spent includes review of chart, medications, test results, and follow up plan with the patient.    Controlled Substance Database was reviewed by me.  This patient was seen by Jonetta Osgood, FNP-C in collaboration with Dr. Clayborn Bigness as a part of collaborative care agreement.  Kazmir Oki R. Valetta Fuller, MSN, FNP-C Internal medicine

## 2023-01-20 LAB — CBC WITH DIFFERENTIAL/PLATELET
Basophils Absolute: 0.1 10*3/uL (ref 0.0–0.2)
Basos: 1 %
EOS (ABSOLUTE): 0.1 10*3/uL (ref 0.0–0.4)
Eos: 2 %
Hematocrit: 38.3 % (ref 34.0–46.6)
Hemoglobin: 12.5 g/dL (ref 11.1–15.9)
Immature Grans (Abs): 0 10*3/uL (ref 0.0–0.1)
Immature Granulocytes: 0 %
Lymphocytes Absolute: 2 10*3/uL (ref 0.7–3.1)
Lymphs: 41 %
MCH: 30.9 pg (ref 26.6–33.0)
MCHC: 32.6 g/dL (ref 31.5–35.7)
MCV: 95 fL (ref 79–97)
Monocytes Absolute: 0.4 10*3/uL (ref 0.1–0.9)
Monocytes: 8 %
Neutrophils Absolute: 2.4 10*3/uL (ref 1.4–7.0)
Neutrophils: 48 %
Platelets: 253 10*3/uL (ref 150–450)
RBC: 4.05 x10E6/uL (ref 3.77–5.28)
RDW: 12.5 % (ref 11.7–15.4)
WBC: 5 10*3/uL (ref 3.4–10.8)

## 2023-01-20 LAB — IRON,TIBC AND FERRITIN PANEL
Ferritin: 17 ng/mL (ref 15–150)
Iron Saturation: 16 % (ref 15–55)
Iron: 52 ug/dL (ref 27–159)
Total Iron Binding Capacity: 325 ug/dL (ref 250–450)
UIBC: 273 ug/dL (ref 131–425)

## 2023-01-20 LAB — CMP14+EGFR
ALT: 19 IU/L (ref 0–32)
AST: 25 IU/L (ref 0–40)
Albumin/Globulin Ratio: 1.5 (ref 1.2–2.2)
Albumin: 4.1 g/dL (ref 3.8–4.9)
Alkaline Phosphatase: 87 IU/L (ref 44–121)
BUN/Creatinine Ratio: 25 (ref 12–28)
BUN: 18 mg/dL (ref 8–27)
Bilirubin Total: 0.3 mg/dL (ref 0.0–1.2)
CO2: 22 mmol/L (ref 20–29)
Calcium: 9.2 mg/dL (ref 8.7–10.3)
Chloride: 104 mmol/L (ref 96–106)
Creatinine, Ser: 0.73 mg/dL (ref 0.57–1.00)
Globulin, Total: 2.7 g/dL (ref 1.5–4.5)
Glucose: 92 mg/dL (ref 70–99)
Potassium: 4.3 mmol/L (ref 3.5–5.2)
Sodium: 140 mmol/L (ref 134–144)
Total Protein: 6.8 g/dL (ref 6.0–8.5)
eGFR: 94 mL/min/{1.73_m2} (ref >59)

## 2023-01-20 LAB — LIPID PANEL
Chol/HDL Ratio: 3.5 ratio (ref 0.0–4.4)
Cholesterol, Total: 195 mg/dL (ref 100–199)
HDL: 56 mg/dL (ref >39)
LDL Chol Calc (NIH): 111 mg/dL — ABNORMAL HIGH (ref 0–99)
Triglycerides: 162 mg/dL — ABNORMAL HIGH (ref 0–149)
VLDL Cholesterol Cal: 28 mg/dL (ref 5–40)

## 2023-01-20 LAB — TSH+FREE T4
Free T4: 1.42 ng/dL (ref 0.82–1.77)
TSH: 1.9 u[IU]/mL (ref 0.450–4.500)

## 2023-01-20 LAB — VITAMIN D 25 HYDROXY (VIT D DEFICIENCY, FRACTURES): Vit D, 25-Hydroxy: 43.7 ng/mL (ref 30.0–100.0)

## 2023-01-20 LAB — B12 AND FOLATE PANEL
Folate: >20 ng/mL (ref >3.0)
Vitamin B-12: >2000 pg/mL — ABNORMAL HIGH (ref 232–1245)

## 2023-01-21 ENCOUNTER — Other Ambulatory Visit: Payer: Self-pay | Admitting: Family Medicine

## 2023-01-21 ENCOUNTER — Ambulatory Visit
Admission: RE | Admit: 2023-01-21 | Discharge: 2023-01-21 | Disposition: A | Discharge status: Home / Self Care | Payer: BC Managed Care – PPO | Source: Ambulatory Visit | Attending: Family Medicine | Admitting: Family Medicine

## 2023-01-21 DIAGNOSIS — M5416 Radiculopathy, lumbar region: Secondary | ICD-10-CM | POA: Diagnosis present

## 2023-01-23 ENCOUNTER — Encounter: Payer: Self-pay | Admitting: Nurse Practitioner

## 2023-01-27 ENCOUNTER — Other Ambulatory Visit: Payer: Self-pay

## 2023-01-27 ENCOUNTER — Inpatient Hospital Stay
Admission: RE | Admit: 2023-01-27 | Discharge: 2023-01-27 | Disposition: A | Discharge status: Home / Self Care | Payer: Self-pay | Source: Ambulatory Visit | Attending: Neurosurgery | Admitting: Neurosurgery

## 2023-01-27 DIAGNOSIS — Z049 Encounter for examination and observation for unspecified reason: Secondary | ICD-10-CM

## 2023-01-28 ENCOUNTER — Ambulatory Visit: Payer: BC Managed Care – PPO | Admitting: Neurosurgery

## 2023-01-28 ENCOUNTER — Encounter: Payer: Self-pay | Admitting: Nurse Practitioner

## 2023-01-28 ENCOUNTER — Encounter: Payer: Self-pay | Admitting: Neurosurgery

## 2023-01-28 VITALS — BP 115/76 | Ht 65.0 in | Wt 224.8 lb

## 2023-01-28 DIAGNOSIS — Z760 Encounter for issue of repeat prescription: Secondary | ICD-10-CM

## 2023-01-28 DIAGNOSIS — M5416 Radiculopathy, lumbar region: Secondary | ICD-10-CM

## 2023-01-28 DIAGNOSIS — G8929 Other chronic pain: Secondary | ICD-10-CM

## 2023-01-28 NOTE — Progress Notes (Signed)
Referring Physician:  Denton Lank, FNP [id number] [place],  [place] [address]  Primary Physician:  Sallyanne Kuster, NP  History of Present Illness: 01/28/2023 Sherry Evans is a 62 y.o with a history of anxiety, depression, GERD, hypothyroidism, sleep apnea, and obesity who is here today with a chief complaint of several months of worsening right radiating leg pain.  She states that this has been going on for a couple of years but has been responsive to injections until recently.  She underwent a injection with Dr. Chaney Malling in April which did not provide her any significant relief.  Her pain is affecting her quality of life.  She describes intermittent electric shooting, sharp, stabbing, tingling, and throbbing down her right leg into her buttocks and calf that is worse with any sort of activity and improved some with sitting or laying down.  She denies any significant back pain or similar left-sided radiating pain.  Conservative measures:  Physical therapy: none since 2012  Multimodal medical therapy including regular antiinflammatories: Gabapentin, meloxicam Injections: multiple epidural steroid injections 01/15/2023: Right L5-S1 and S1 transforaminal ESI 11/18/2021: Right L5-S1 transforaminal ESI (70% relief)  04/10/2021: Right L5-S1 transforaminal ESI (good relief) 03/11/2021: Right L5-S1 transforaminal ESI (60% relief) 06/19/2020: Left L5-S1 transforaminal ESI (good relief) 01/23/2020: Left L5-S1 transforaminal ESI (good relief) 11/15/2018: Left L5-S1 transforaminal ESI (good relief) 10/18/2018: Left L5-S1 transforaminal ESI (good relief)   Past Surgery: previous lumbar lami   Sherry Evans has no symptoms of cervical myelopathy.  The symptoms are causing a significant impact on the patient's life.   Review of Systems:  A 10 point review of systems is negative, except for the pertinent positives and negatives detailed in the HPI.  Past Medical  History: Past Medical History:  Diagnosis Date   Anxiety    Depression    GERD (gastroesophageal reflux disease)    History of sinus surgery    3 times   Hypothyroidism    Sleep apnea    use C-PAP   Trigger finger     Past Surgical History: Past Surgical History:  Procedure Laterality Date   adnoids removed  1969   BACK SURGERY  2008   BICEPT TENODESIS Right 04/02/2016   Procedure: BICEPS TENODESIS;  Surgeon: Christena Flake, MD;  Location: [facility];  Service: Orthopedics;  Laterality: Right;   BLADDER SUSPENSION  2009   BREAST BIOPSY Left 1990's   benign   BREAST BIOPSY Left 10/28/2015   SMALL FRAGMENTS OF LYMPHOID TISSUE   CHOLECYSTECTOMY     COLONOSCOPY     DILATION AND CURETTAGE OF UTERUS     GASTRIC RESTRICTION SURGERY  08/23/2015   HERNIA REPAIR  2016   hiatal   JOINT REPLACEMENT Bilateral 2014   Partial Knee Replacement,  Surgeon: Dr. Elfredia Nevins, [facility]   KNEE SURGERY  2014   SHOULDER ARTHROSCOPY WITH DEBRIDEMENT AND BICEP TENDON REPAIR Right 04/02/2016   Procedure: SHOULDER ARTHROSCOPY WITH DEBRIDEMENT ;  Surgeon: Christena Flake, MD;  Location: [facility];  Service: Orthopedics;  Laterality: Right;   SHOULDER ARTHROSCOPY WITH OPEN ROTATOR CUFF REPAIR Right 04/02/2016   Procedure: SHOULDER ARTHROSCOPY WITH OPEN ROTATOR CUFF REPAIR;  Surgeon: Christena Flake, MD;  Location: [facility];  Service: Orthopedics;  Laterality: Right;   SHOULDER ARTHROSCOPY WITH SUBACROMIAL DECOMPRESSION Right 04/02/2016   Procedure: SHOULDER ARTHROSCOPY WITH SUBACROMIAL DECOMPRESSION;  Surgeon: Christena Flake, MD;  Location: [facility];  Service: Orthopedics;  Laterality: Right;  TENDON RELEASE Right 08/2021    Allergies: Allergies as of 01/28/2023 - Review Complete 12/04/2022  Allergen Reaction Noted   Levonorgestrel-ethinyl estrad  10/11/2017   Azithromycin Itching and Rash 10/28/2015   Sulfa antibiotics Rash and Itching 09/05/2015    Medications: Outpatient Encounter Medications as  of 01/28/2023  Medication Sig   budesonide (PULMICORT) 0.5 MG/2ML nebulizer solution Take by nebulization 2 (two) times daily.   Cholecalciferol 50 MCG (2000 UT) TABS Take 2,000 Units by mouth daily.   gabapentin (NEURONTIN) 100 MG capsule Take 1 capsule (100 mg total) by mouth 2 (two) times daily with breakfast and lunch.   gabapentin (NEURONTIN) 600 MG tablet TAKE 2 TABLETS BY MOUTH AT EVENING MEAL   ketoconazole (NIZORAL) 2 % cream Apply 1 application topically daily.   levocetirizine (XYZAL) 5 MG tablet Take 1 tablet (5 mg total) by mouth daily.   levothyroxine (SYNTHROID) 100 MCG tablet Take 1 tablet (100 mcg total) by mouth daily.   linaclotide (LINZESS) 145 MCG CAPS capsule TAKE 1 CAPSULE BY MOUTH DAILY BEFORE BREAKFAST.   meloxicam (MOBIC) 15 MG tablet Take 1 tablet (15 mg total) by mouth daily.   montelukast (SINGULAIR) 10 MG tablet TAKE 1 TABLET BY MOUTH DAILY FOR ALLERGIES   Multiple Vitamins-Calcium (VIACTIV MULTI-VITAMIN) CHEW Chew 1 tablet by mouth daily.   nystatin-triamcinolone ointment (MYCOLOG) Apply topically 2 (two) times daily as needed.   Probiotic Product (PROBIOTIC-10 PO) Take by mouth.   sertraline (ZOLOFT) 50 MG tablet TAKE 1 TABLET BY MOUTH EVERY DAY   traZODone (DESYREL) 50 MG tablet TAKE 1 TABLET (50 MG TOTAL) BY MOUTH AT BEDTIME AS NEEDED.   No facility-administered encounter medications on file as of 01/28/2023.    Social History: Social History   Tobacco Use   Smoking status: Never   Smokeless tobacco: Never  Vaping Use   Vaping Use: Never used  Substance Use Topics   Alcohol use: No    Alcohol/week: 0.0 standard drinks of alcohol   Drug use: No    Family Medical History: Family History  Problem Relation Age of Onset   Hypertension Father    Diabetes Father    Breast cancer Maternal Aunt        great aunts. 70-80   Breast cancer Maternal Grandmother 20       metatastic to breast   Ovarian cancer Maternal Grandmother    Breast cancer Paternal  Grandmother        43's   Colon cancer Neg Hx     Physical Examination: Today's Vitals   01/28/23 0921  BP: 115/76  Weight: 102 kg  Height: 5\' 5"  (1.651 m)  PainSc: 6   PainLoc: Leg   Body mass index is 37.41 kg/m.  General: Patient is well developed, well nourished, calm, collected, and in no apparent distress. Attention to examination is appropriate.  Psychiatric: Patient is non-anxious.  Head:  Pupils equal, round, and reactive to light.  ENT:  Oral mucosa appears well hydrated.  Neck:   Supple.  Full range of motion.  Respiratory: Patient is breathing without any difficulty.  Extremities: No edema.  Vascular: Palpable dorsal pedal pulses.  Skin:   On exposed skin, there are no abnormal skin lesions.  NEUROLOGICAL:     Awake, alert, oriented to person, place, and time.  Speech is clear and fluent. Fund of knowledge is appropriate.   Cranial Nerves: Pupils equal round and reactive to light.  Facial tone is symmetric.   ROM of spine: limited  due to pain Palpation of spine: non tender.    Strength: Side Biceps Triceps Deltoid Interossei Grip Wrist Ext. Wrist Flex.  R 5 5 5 5 5 5 5   L 5 5 5 5 5 5 5    Side Iliopsoas Quads Hamstring PF DF EHL  R 5 5 5 4 5 5   L 5 5 5 5 5 5    Reflexes are 1+ and symmetric at the biceps, triceps, brachioradialis, patella and achilles.   Hoffman's is absent.  Clonus is not present.  Toes are down-going.  Bilateral upper and lower extremity sensation is intact to light touch.    Ambulates with the assistance of crutches secondary to pain.  Medical Decision Making  Imaging: MRI 01/21/2023 IMPRESSION: 1. Lumbar spondylosis and degenerative disc disease, causing prominent impingement at L5-S1; mild impingement at L3-4 and L4-5; and prior right laminectomy at L5-S1. The dominant finding is a new right lateral recess disc protrusion at L5-S1 causing prominent impingement on the right S1 nerve roots despite a prior right laminectomy  at this level.     Electronically Signed   By: Gaylyn Rong M.D.   On: 01/21/2023 16:47   I have personally reviewed the images and agree with the above interpretation.  Assessment and Plan: Ms. Mcadory is a pleasant 62 y.o. female with a convincing history of lumbar radiculopathy.  Her MRI shows significant right S1 nerve root impingement which correlates well with her symptoms.  She has failed to respond favorably to recent injections and would like to move forward with discussion of surgery.  Unfortunately she has not undergone any physical therapy in the last year.  I recommended that she see physical therapy and either complete a 6-week course of therapy or be formally discharged should the therapist find this to be appropriate.  Should she complete physical therapy or be discharged, we will get her scheduled to see Dr. Marcell Barlow to discuss surgical options.  She would like to go to [facility] for therapy.  She will contact us once she has completed this and we will get her scheduled.  Thank you for involving me in the care of this patient.   I spent a total of 31 minutes in both face-to-face and non-face-to-face activities for this visit on the date of this encounter including review of imaging, review of outside records, discussion of differential diagnosis, discussion of treatment options, documentation, and outside provider communication.   [facility]

## 2023-01-29 MED ORDER — GABAPENTIN 600 MG PO TABS: ORAL_TABLET | ORAL | 1 refills | Status: DC

## 2023-02-11 NOTE — Telephone Encounter (Signed)
Please get her scheduled to see Myer Haff at his next available NP slot to discuss surgery per [facility].

## 2023-02-12 NOTE — H&P (View-Only) (Signed)
  Referring Physician:  Abernathy, Alyssa, NP [address] [place],  [place] [address]  Primary Physician:  Abernathy, Alyssa, NP  History of Present Illness: 02/16/2023 [name] is here today with a chief complaint of some low back pain that radiates into the right buttocks and right leg, into the foot. She also complains of tingling in the leg with some numbness in the foot.   She initially had surgery at this level in 2008.  She had an excellent response to surgery, but recently over the last several years has had worsening pain in her right leg.  She has previously had multiple injections which helped for some period of time, but her pain has not been alleviated by injections and physical therapy more recently.  She was evaluated by physical therapy, but was discharged due to being in an appropriate candidate for therapy.  She describes electric shooting and sharp stabbing pain that goes down her right leg.  It goes down her buttock and down the entire back of her leg to her heel.  She has numbness in the bottom of her foot.  Any activity makes it worse.  Sitting and laying down make it better.  She is unable to walk any distance, and has difficulty with standing.   Bowel/Bladder Dysfunction: none  Conservative measures:  Physical therapy:  has participated in 1 visit at [facility] and was discharged.  Multimodal medical therapy including regular antiinflammatories:  gabapentin, meloxicam  Injections:  has received epidural steroid injections 01/15/2023: Right L5-S1 and S1 transforaminal ESI 11/18/2021: Right L5-S1 transforaminal ESI (70% relief)  04/10/2021: Right L5-S1 transforaminal ESI (good relief) 03/11/2021: Right L5-S1 transforaminal ESI (60% relief) 06/19/2020: Left L5-S1 transforaminal ESI (good relief) 01/23/2020: Left L5-S1 transforaminal ESI (good relief) 11/15/2018: Left L5-S1 transforaminal ESI (good relief) 10/18/2018: Left L5-S1 transforaminal ESI  (good relief)   Past Surgery:  Lumbar Surgery in 2008  Progress Note from Danielle Koch, PA on 01/28/23:  History of Present Illness: 01/28/2023 [name] is a 62 y.o with a history of anxiety, depression, GERD, hypothyroidism, sleep apnea, and obesity who is here today with a chief complaint of several months of worsening right radiating leg pain.  She states that this has been going on for a couple of years but has been responsive to injections until recently.  She underwent a injection with Dr. Chassnis in April which did not provide her any significant relief.  Her pain is affecting her quality of life.  She describes intermittent electric shooting, sharp, stabbing, tingling, and throbbing down her right leg into her buttocks and calf that is worse with any sort of activity and improved some with sitting or laying down.  She denies any significant back pain or similar left-sided radiating pain.   The symptoms are causing a significant impact on the patient's life.   I have utilized the care everywhere function in epic to review the outside records available from external health systems.  Review of Systems:  A 10 point review of systems is negative, except for the pertinent positives and negatives detailed in the HPI.  Past Medical History: Past Medical History:  Diagnosis Date   Anxiety    Depression    GERD (gastroesophageal reflux disease)    History of sinus surgery    3 times   Hypothyroidism    Sleep apnea    use C-PAP   Trigger finger     Past Surgical History: Past Surgical History:  Procedure Laterality Date     adnoids removed  1969   BACK SURGERY  2008   BICEPT TENODESIS Right 04/02/2016   Procedure: BICEPS TENODESIS;  Surgeon: John J Poggi, MD;  Location: [facility];  Service: Orthopedics;  Laterality: Right;   BLADDER SUSPENSION  2009   BREAST BIOPSY Left 1990's   benign   BREAST BIOPSY Left 10/28/2015   SMALL FRAGMENTS OF LYMPHOID TISSUE   CHOLECYSTECTOMY      COLONOSCOPY     DILATION AND CURETTAGE OF UTERUS     GASTRIC RESTRICTION SURGERY  08/23/2015   HERNIA REPAIR  2016   hiatal   JOINT REPLACEMENT Bilateral 2014   Partial Knee Replacement,  Surgeon: Dr. [facility]   KNEE SURGERY  2014   SHOULDER ARTHROSCOPY WITH DEBRIDEMENT AND BICEP TENDON REPAIR Right 04/02/2016   Procedure: SHOULDER ARTHROSCOPY WITH DEBRIDEMENT ;  Surgeon: John J Poggi, MD;  Location: [facility];  Service: Orthopedics;  Laterality: Right;   SHOULDER ARTHROSCOPY WITH OPEN ROTATOR CUFF REPAIR Right 04/02/2016   Procedure: SHOULDER ARTHROSCOPY WITH OPEN ROTATOR CUFF REPAIR;  Surgeon: John J Poggi, MD;  Location: [facility];  Service: Orthopedics;  Laterality: Right;   SHOULDER ARTHROSCOPY WITH SUBACROMIAL DECOMPRESSION Right 04/02/2016   Procedure: SHOULDER ARTHROSCOPY WITH SUBACROMIAL DECOMPRESSION;  Surgeon: John J Poggi, MD;  Location: [facility];  Service: Orthopedics;  Laterality: Right;   TENDON RELEASE Right 08/2021    Allergies: Allergies as of 02/16/2023 - Review Complete 02/16/2023  Allergen Reaction Noted   Levonorgestrel-ethinyl estrad  10/11/2017   Azithromycin Itching and Rash 10/28/2015   Sulfa antibiotics Rash and Itching 09/05/2015    Medications:  Current Outpatient Medications:    budesonide (PULMICORT) 0.5 MG/2ML nebulizer solution, Take by nebulization 2 (two) times daily., Disp: , Rfl:    Cholecalciferol 50 MCG (2000 UT) TABS, Take 2,000 Units by mouth daily., Disp: , Rfl:    gabapentin (NEURONTIN) 100 MG capsule, Take 1 capsule (100 mg total) by mouth 2 (two) times daily with breakfast and lunch., Disp: 180 capsule, Rfl: 1   gabapentin (NEURONTIN) 600 MG tablet, TAKE 3 TABLETS BY MOUTH AT EVENING MEAL, Disp: 270 tablet, Rfl: 1   ketoconazole (NIZORAL) 2 % cream, Apply 1 application topically daily., Disp: 15 g, Rfl: 0   levocetirizine (XYZAL) 5 MG tablet, Take 1 tablet (5 mg total) by mouth daily., Disp: 90 tablet, Rfl: 1    levothyroxine (SYNTHROID) 100 MCG tablet, Take 1 tablet (100 mcg total) by mouth daily., Disp: 90 tablet, Rfl: 1   linaclotide (LINZESS) 145 MCG CAPS capsule, TAKE 1 CAPSULE BY MOUTH DAILY BEFORE BREAKFAST., Disp: 90 capsule, Rfl: 1   meloxicam (MOBIC) 15 MG tablet, Take 1 tablet (15 mg total) by mouth daily., Disp: 90 tablet, Rfl: 1   montelukast (SINGULAIR) 10 MG tablet, TAKE 1 TABLET BY MOUTH DAILY FOR ALLERGIES, Disp: 90 tablet, Rfl: 1   Multiple Vitamins-Calcium (VIACTIV MULTI-VITAMIN) CHEW, Chew 1 tablet by mouth daily., Disp: , Rfl:    nystatin-triamcinolone ointment (MYCOLOG), Apply topically 2 (two) times daily as needed., Disp: 30 g, Rfl: 3   Probiotic Product (PROBIOTIC-10 PO), Take by mouth., Disp: , Rfl:    sertraline (ZOLOFT) 50 MG tablet, TAKE 1 TABLET BY MOUTH EVERY DAY, Disp: 90 tablet, Rfl: 1   traZODone (DESYREL) 50 MG tablet, TAKE 1 TABLET (50 MG TOTAL) BY MOUTH AT BEDTIME AS NEEDED., Disp: 90 tablet, Rfl: 1  Social History: Social History   Tobacco Use   Smoking status: Never   Smokeless tobacco:   Never  Vaping Use   Vaping Use: Never used  Substance Use Topics   Alcohol use: No    Alcohol/week: 0.0 standard drinks of alcohol   Drug use: No    Family Medical History: Family History  Problem Relation Age of Onset   Hypertension Father    Diabetes Father    Breast cancer Maternal Aunt        great aunts. 70-80   Breast cancer Maternal Grandmother 70       metatastic to breast   Ovarian cancer Maternal Grandmother    Breast cancer Paternal Grandmother        70's   Colon cancer Neg Hx     Physical Examination: There were no vitals filed for this visit.  General: Patient is in no apparent distress. Attention to examination is appropriate.  Neck:   Supple.  Full range of motion.  Respiratory: Patient is breathing without any difficulty.   NEUROLOGICAL:     Awake, alert, oriented to person, place, and time.  Speech is clear and fluent.   Cranial  Nerves: Pupils equal round and reactive to light.  Facial tone is symmetric.  Facial sensation is symmetric. Shoulder shrug is symmetric. Tongue protrusion is midline.  There is no pronator drift.  Strength: Side Biceps Triceps Deltoid Interossei Grip Wrist Ext. Wrist Flex.  R 5 5 5 5 5 5 5  L 5 5 5 5 5 5 5   Side Iliopsoas Quads Hamstring PF DF EHL  R 5 5 5 4 5 5  L 5 5 5 5 5 5   Reflexes are 1+ and symmetric at the biceps, triceps, brachioradialis, patella and achilles.   Hoffman's is absent.   Bilateral upper and lower extremity sensation is intact to light touch with exception of R S1 distribution.    No evidence of dysmetria noted.  Gait is abnormal and requires crutches due to severe pain.  She has positive straight leg raise at 30 degrees on the right..     Medical Decision Making  Imaging: MRI L spine 01/21/2023 IMPRESSION: 1. Lumbar spondylosis and degenerative disc disease, causing prominent impingement at L5-S1; mild impingement at L3-4 and L4-5; and prior right laminectomy at L5-S1. The dominant finding is a new right lateral recess disc protrusion at L5-S1 causing prominent impingement on the right S1 nerve roots despite a prior right laminectomy at this level.     Electronically Signed   By: Walter  Liebkemann M.D.   On: 01/21/2023 16:47    I have personally reviewed the images and agree with the above interpretation.  Assessment and Plan: [name] is a pleasant 62 y.o. 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  She has tried physical therapy and was discharged.  Medications and injections have not alleviated her pain.  At this point, no further conservative management is indicated.  I recommended a right-sided recurrent L5-S1 microdiscectomy.  I discussed the planned procedure at length with the patient, including the risks, benefits, alternatives, and indications.  The risks discussed include but are not limited to bleeding, infection, need for reoperation, spinal fluid leak, stroke, vision loss, anesthetic complication, coma, paralysis, and even death. I also described in detail that improvement was not guaranteed.  The patient expressed understanding of these risks, and asked that we proceed with surgery. I described the surgery in layman's terms, and gave ample opportunity for questions, which were answered to   the best of my ability.  I spent a total of 30 minutes in this patient's care today. This time was spent reviewing pertinent records including imaging studies, obtaining and confirming history, performing a directed evaluation, formulating and discussing my recommendations, and documenting the visit within the medical record.      Thank you for involving me in the care of this patient.      Charliee Krenz K. Amandeep Nesmith MD, MPHS Neurosurgery  

## 2023-02-12 NOTE — Progress Notes (Unsigned)
Referring Physician:  Sallyanne Kuster, NP [address] [place],  [place] [address]  Primary Physician:  Sallyanne Kuster, NP  History of Present Illness: 02/16/2023 [name] is here today with a chief complaint of some low back pain that radiates into the right buttocks and right leg, into the foot. She also complains of tingling in the leg with some numbness in the foot.   She initially had surgery at this level in 2008.  She had an excellent response to surgery, but recently over the last several years has had worsening pain in her right leg.  She has previously had multiple injections which helped for some period of time, but her pain has not been alleviated by injections and physical therapy more recently.  She was evaluated by physical therapy, but was discharged due to being in an appropriate candidate for therapy.  She describes electric shooting and sharp stabbing pain that goes down her right leg.  It goes down her buttock and down the entire back of her leg to her heel.  She has numbness in the bottom of her foot.  Any activity makes it worse.  Sitting and laying down make it better.  She is unable to walk any distance, and has difficulty with standing.   Bowel/Bladder Dysfunction: none  Conservative measures:  Physical therapy:  has participated in 1 visit at [facility] and was discharged.  Multimodal medical therapy including regular antiinflammatories:  gabapentin, meloxicam  Injections:  has received epidural steroid injections 01/15/2023: Right L5-S1 and S1 transforaminal ESI 11/18/2021: Right L5-S1 transforaminal ESI (70% relief)  04/10/2021: Right L5-S1 transforaminal ESI (good relief) 03/11/2021: Right L5-S1 transforaminal ESI (60% relief) 06/19/2020: Left L5-S1 transforaminal ESI (good relief) 01/23/2020: Left L5-S1 transforaminal ESI (good relief) 11/15/2018: Left L5-S1 transforaminal ESI (good relief) 10/18/2018: Left L5-S1 transforaminal ESI  (good relief)   Past Surgery:  Lumbar Surgery in 2008  Progress Note from Manning Charity, PA on 01/28/23:  History of Present Illness: 01/28/2023 [name] is a 62 y.o with a history of anxiety, depression, GERD, hypothyroidism, sleep apnea, and obesity who is here today with a chief complaint of several months of worsening right radiating leg pain.  She states that this has been going on for a couple of years but has been responsive to injections until recently.  She underwent a injection with Dr. Chaney Malling in April which did not provide her any significant relief.  Her pain is affecting her quality of life.  She describes intermittent electric shooting, sharp, stabbing, tingling, and throbbing down her right leg into her buttocks and calf that is worse with any sort of activity and improved some with sitting or laying down.  She denies any significant back pain or similar left-sided radiating pain.   The symptoms are causing a significant impact on the patient's life.   I have utilized the care everywhere function in epic to review the outside records available from external health systems.  Review of Systems:  A 10 point review of systems is negative, except for the pertinent positives and negatives detailed in the HPI.  Past Medical History: Past Medical History:  Diagnosis Date   Anxiety    Depression    GERD (gastroesophageal reflux disease)    History of sinus surgery    3 times   Hypothyroidism    Sleep apnea    use C-PAP   Trigger finger     Past Surgical History: Past Surgical History:  Procedure Laterality Date  adnoids removed  1969   BACK SURGERY  2008   BICEPT TENODESIS Right 04/02/2016   Procedure: BICEPS TENODESIS;  Surgeon: Christena Flake, MD;  Location: [facility] ORS;  Service: Orthopedics;  Laterality: Right;   BLADDER SUSPENSION  2009   BREAST BIOPSY Left 1990's   benign   BREAST BIOPSY Left 10/28/2015   SMALL FRAGMENTS OF LYMPHOID TISSUE   CHOLECYSTECTOMY      COLONOSCOPY     DILATION AND CURETTAGE OF UTERUS     GASTRIC RESTRICTION SURGERY  08/23/2015   HERNIA REPAIR  2016   hiatal   JOINT REPLACEMENT Bilateral 2014   Partial Knee Replacement,  Surgeon: Dr. Elfredia Nevins, [facility]   KNEE SURGERY  2014   SHOULDER ARTHROSCOPY WITH DEBRIDEMENT AND BICEP TENDON REPAIR Right 04/02/2016   Procedure: SHOULDER ARTHROSCOPY WITH DEBRIDEMENT ;  Surgeon: Christena Flake, MD;  Location: [facility] ORS;  Service: Orthopedics;  Laterality: Right;   SHOULDER ARTHROSCOPY WITH OPEN ROTATOR CUFF REPAIR Right 04/02/2016   Procedure: SHOULDER ARTHROSCOPY WITH OPEN ROTATOR CUFF REPAIR;  Surgeon: Christena Flake, MD;  Location: [facility] ORS;  Service: Orthopedics;  Laterality: Right;   SHOULDER ARTHROSCOPY WITH SUBACROMIAL DECOMPRESSION Right 04/02/2016   Procedure: SHOULDER ARTHROSCOPY WITH SUBACROMIAL DECOMPRESSION;  Surgeon: Christena Flake, MD;  Location: [facility] ORS;  Service: Orthopedics;  Laterality: Right;   TENDON RELEASE Right 08/2021    Allergies: Allergies as of 02/16/2023 - Review Complete 02/16/2023  Allergen Reaction Noted   Levonorgestrel-ethinyl estrad  10/11/2017   Azithromycin Itching and Rash 10/28/2015   Sulfa antibiotics Rash and Itching 09/05/2015    Medications:  Current Outpatient Medications:    budesonide (PULMICORT) 0.5 MG/2ML nebulizer solution, Take by nebulization 2 (two) times daily., Disp: , Rfl:    Cholecalciferol 50 MCG (2000 UT) TABS, Take 2,000 Units by mouth daily., Disp: , Rfl:    gabapentin (NEURONTIN) 100 MG capsule, Take 1 capsule (100 mg total) by mouth 2 (two) times daily with breakfast and lunch., Disp: 180 capsule, Rfl: 1   gabapentin (NEURONTIN) 600 MG tablet, TAKE 3 TABLETS BY MOUTH AT EVENING MEAL, Disp: 270 tablet, Rfl: 1   ketoconazole (NIZORAL) 2 % cream, Apply 1 application topically daily., Disp: 15 g, Rfl: 0   levocetirizine (XYZAL) 5 MG tablet, Take 1 tablet (5 mg total) by mouth daily., Disp: 90 tablet, Rfl: 1    levothyroxine (SYNTHROID) 100 MCG tablet, Take 1 tablet (100 mcg total) by mouth daily., Disp: 90 tablet, Rfl: 1   linaclotide (LINZESS) 145 MCG CAPS capsule, TAKE 1 CAPSULE BY MOUTH DAILY BEFORE BREAKFAST., Disp: 90 capsule, Rfl: 1   meloxicam (MOBIC) 15 MG tablet, Take 1 tablet (15 mg total) by mouth daily., Disp: 90 tablet, Rfl: 1   montelukast (SINGULAIR) 10 MG tablet, TAKE 1 TABLET BY MOUTH DAILY FOR ALLERGIES, Disp: 90 tablet, Rfl: 1   Multiple Vitamins-Calcium (VIACTIV MULTI-VITAMIN) CHEW, Chew 1 tablet by mouth daily., Disp: , Rfl:    nystatin-triamcinolone ointment (MYCOLOG), Apply topically 2 (two) times daily as needed., Disp: 30 g, Rfl: 3   Probiotic Product (PROBIOTIC-10 PO), Take by mouth., Disp: , Rfl:    sertraline (ZOLOFT) 50 MG tablet, TAKE 1 TABLET BY MOUTH EVERY DAY, Disp: 90 tablet, Rfl: 1   traZODone (DESYREL) 50 MG tablet, TAKE 1 TABLET (50 MG TOTAL) BY MOUTH AT BEDTIME AS NEEDED., Disp: 90 tablet, Rfl: 1  Social History: Social History   Tobacco Use   Smoking status: Never   Smokeless tobacco:  Never  Vaping Use   Vaping Use: Never used  Substance Use Topics   Alcohol use: No    Alcohol/week: 0.0 standard drinks of alcohol   Drug use: No    Family Medical History: Family History  Problem Relation Age of Onset   Hypertension Father    Diabetes Father    Breast cancer Maternal Aunt        great aunts. 70-80   Breast cancer Maternal Grandmother 12       metatastic to breast   Ovarian cancer Maternal Grandmother    Breast cancer Paternal Grandmother        16's   Colon cancer Neg Hx     Physical Examination: There were no vitals filed for this visit.  General: Patient is in no apparent distress. Attention to examination is appropriate.  Neck:   Supple.  Full range of motion.  Respiratory: Patient is breathing without any difficulty.   NEUROLOGICAL:     Awake, alert, oriented to person, place, and time.  Speech is clear and fluent.   Cranial  Nerves: Pupils equal round and reactive to light.  Facial tone is symmetric.  Facial sensation is symmetric. Shoulder shrug is symmetric. Tongue protrusion is midline.  There is no pronator drift.  Strength: Side Biceps Triceps Deltoid Interossei Grip Wrist Ext. Wrist Flex.  R 5 5 5 5 5 5 5   L 5 5 5 5 5 5 5    Side Iliopsoas Quads Hamstring PF DF EHL  R 5 5 5 4 5 5   L 5 5 5 5 5 5    Reflexes are 1+ and symmetric at the biceps, triceps, brachioradialis, patella and achilles.   Hoffman's is absent.   Bilateral upper and lower extremity sensation is intact to light touch with exception of R S1 distribution.    No evidence of dysmetria noted.  Gait is abnormal and requires crutches due to severe pain.  She has positive straight leg raise at 30 degrees on the right..     Medical Decision Making  Imaging: MRI L spine 01/21/2023 IMPRESSION: 1. Lumbar spondylosis and degenerative disc disease, causing prominent impingement at L5-S1; mild impingement at L3-4 and L4-5; and prior right laminectomy at L5-S1. The dominant finding is a new right lateral recess disc protrusion at L5-S1 causing prominent impingement on the right S1 nerve roots despite a prior right laminectomy at this level.     Electronically Signed   By: Gaylyn Rong M.D.   On: 01/21/2023 16:47    I have personally reviewed the images and agree with the above interpretation.  Assessment and Plan: [name] is a pleasant 62 y.o. 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  She has tried physical therapy and was discharged.  Medications and injections have not alleviated her pain.  At this point, no further conservative management is indicated.  I recommended a right-sided recurrent L5-S1 microdiscectomy.  I discussed the planned procedure at length with the patient, including the risks, benefits, alternatives, and indications.  The risks discussed include but are not limited to bleeding, infection, need for reoperation, spinal fluid leak, stroke, vision loss, anesthetic complication, coma, paralysis, and even death. I also described in detail that improvement was not guaranteed.  The patient expressed understanding of these risks, and asked that we proceed with surgery. I described the surgery in layman's terms, and gave ample opportunity for questions, which were answered to  the best of my ability.  I spent a total of 30 minutes in this patient's care today. This time was spent reviewing pertinent records including imaging studies, obtaining and confirming history, performing a directed evaluation, formulating and discussing my recommendations, and documenting the visit within the medical record.      Thank you for involving me in the care of this patient.      Heaven Wandell K. Myer Haff MD, [facility] Neurosurgery

## 2023-02-16 ENCOUNTER — Encounter: Payer: Self-pay | Admitting: Neurosurgery

## 2023-02-16 ENCOUNTER — Other Ambulatory Visit: Payer: Self-pay

## 2023-02-16 ENCOUNTER — Ambulatory Visit: Payer: BC Managed Care – PPO | Admitting: Neurosurgery

## 2023-02-16 VITALS — BP 142/82 | Ht 65.0 in | Wt 224.0 lb

## 2023-02-16 DIAGNOSIS — Z01818 Encounter for other preprocedural examination: Secondary | ICD-10-CM

## 2023-02-16 DIAGNOSIS — M5117 Intervertebral disc disorders with radiculopathy, lumbosacral region: Secondary | ICD-10-CM

## 2023-02-16 DIAGNOSIS — M5416 Radiculopathy, lumbar region: Secondary | ICD-10-CM

## 2023-02-16 DIAGNOSIS — M5126 Other intervertebral disc displacement, lumbar region: Secondary | ICD-10-CM

## 2023-02-16 NOTE — Patient Instructions (Signed)
Please see below for information in regards to your upcoming surgery:   Planned surgery: Right L5-S1 microdiscectomy   Surgery date: 02/24/23 - you will find out your arrival time the business day before your surgery.   Pre-op appointment at [facility] Pre-admit Testing: we will call you with a date/time for this. Pre-admit testing is located on the first floor of the [facility] building, [address] [facility] [address], [address]. Please bring all prescriptions in the original prescription bottles to your appointment, even if you have reviewed medications by phone with a pharmacy representative. Pre-op labs may be done at your pre-op appointment. You are not required to fast for these labs. Should you need to change your pre-op appointment, please call Pre-admit testing at [phone].    Surgical clearance: we will send a clearance form to Sallyanne Kuster, NP    How to contact us:  If you have any questions/concerns before or after surgery, you can reach [place] at [phone], or you can send a mychart message. We can be reached by phone or mychart 8am-4pm, Monday-Friday.  *Please note: Calls after 4pm are forwarded to a third party answering service. Mychart messages are not routinely monitored during evenings, weekends, and holidays. Please call our office to contact the answering service for urgent concerns during non-business hours.    Appointments/FMLA & disability paperwork: [employer]ty [employer] Cristin  Nurse: Royston Cowper  Medical assistants: Laurann Montana Physician Assistant's: Manning Charity & Drake Leach Surgeon: Venetia Night, MD

## 2023-02-19 ENCOUNTER — Encounter
Admission: RE | Admit: 2023-02-19 | Discharge: 2023-02-19 | Disposition: A | Discharge status: Home / Self Care | Payer: BC Managed Care – PPO | Source: Ambulatory Visit | Attending: Neurosurgery | Admitting: Neurosurgery

## 2023-02-19 DIAGNOSIS — Z01818 Encounter for other preprocedural examination: Secondary | ICD-10-CM | POA: Diagnosis present

## 2023-02-19 DIAGNOSIS — R9431 Abnormal electrocardiogram [ECG] [EKG]: Secondary | ICD-10-CM | POA: Diagnosis not present

## 2023-02-19 DIAGNOSIS — Z01812 Encounter for preprocedural laboratory examination: Secondary | ICD-10-CM

## 2023-02-19 DIAGNOSIS — M5416 Radiculopathy, lumbar region: Secondary | ICD-10-CM | POA: Diagnosis not present

## 2023-02-19 HISTORY — DX: Other intervertebral disc degeneration, lumbar region without mention of lumbar back pain or lower extremity pain: M51.369

## 2023-02-19 HISTORY — DX: Irritable bowel syndrome with constipation: K58.1

## 2023-02-19 HISTORY — DX: Obesity, unspecified: E66.9

## 2023-02-19 HISTORY — DX: Hyperlipidemia, unspecified: E78.5

## 2023-02-19 HISTORY — DX: Radiculopathy, lumbar region: M54.16

## 2023-02-19 HISTORY — DX: Palpitations: R00.2

## 2023-02-19 HISTORY — DX: Other intervertebral disc degeneration, lumbar region: M51.36

## 2023-02-19 HISTORY — DX: Abnormal electrocardiogram (ECG) (EKG): R94.31

## 2023-02-19 LAB — URINALYSIS, ROUTINE W REFLEX MICROSCOPIC
Bilirubin Urine: NEGATIVE
Glucose, UA: NEGATIVE mg/dL
Hgb urine dipstick: NEGATIVE
Ketones, ur: NEGATIVE mg/dL
Nitrite: NEGATIVE
Protein, ur: NEGATIVE mg/dL
Specific Gravity, Urine: 1.027 (ref 1.005–1.030)
pH: 5 (ref 5.0–8.0)

## 2023-02-19 LAB — TYPE AND SCREEN
ABO/RH(D): B POS
Antibody Screen: NEGATIVE

## 2023-02-19 LAB — SURGICAL PCR SCREEN
MRSA, PCR: NEGATIVE
Staphylococcus aureus: NEGATIVE

## 2023-02-19 NOTE — Patient Instructions (Addendum)
Your procedure is scheduled on: Wednesday, June 5 Report to the Registration Desk on the 1st floor of the [facility] To find out your arrival time, please call [phone] between 1PM - 3PM on: Tuesday, June 4 If your arrival time is 6:00 am, do not arrive before that time as the [facility] entrance doors do not open until 6:00 am.  REMEMBER: Instructions that are not followed completely may result in serious medical risk, up to and including death; or upon the discretion of your surgeon and anesthesiologist your surgery may need to be rescheduled.  Do not eat food after midnight the night before surgery.  No gum chewing or hard candies.  You may however, drink CLEAR liquids up to 2 hours before you are scheduled to arrive for your surgery. Do not drink anything within 2 hours of your scheduled arrival time.  Clear liquids include: - water  - apple juice without pulp - gatorade (not RED colors) - black coffee or tea (Do NOT add milk or creamers to the coffee or tea) Do NOT drink anything that is not on this list.  One week prior to surgery: starting today, May 31 Stop ANY OVER THE COUNTER supplements until after surgery.  You may however, continue to take Tylenol if needed for pain up until the day of surgery.  Continue taking all prescribed medications   TAKE ONLY THESE MEDICATIONS THE MORNING OF SURGERY WITH A SIP OF WATER:  Budesonide (Pulmicort) nasal spray Gabapentin Levothyroxine  No Alcohol for 24 hours before or after surgery.  No Smoking including e-cigarettes for 24 hours before surgery.  No chewable tobacco products for at least 6 hours before surgery.  No nicotine patches on the day of surgery.  Do not use any "recreational" drugs for at least a week (preferably 2 weeks) before your surgery.  Please be advised that the combination of cocaine and anesthesia may have negative outcomes, up to and including death. If you test positive for cocaine, your surgery  will be cancelled.  On the morning of surgery brush your teeth with toothpaste and water, you may rinse your mouth with mouthwash if you wish. Do not swallow any toothpaste or mouthwash.  Use CHG Soap as directed on instruction sheet.  Do not wear jewelry, make-up, hairpins, clips or nail polish.  Do not wear lotions, powders, or perfumes.   Do not shave body hair from the neck down 48 hours before surgery.  Contact lenses, hearing aids and dentures may not be worn into surgery.  Do not bring valuables to the hospital. [facility] is not responsible for any missing/lost belongings or valuables.   Notify your doctor if there is any change in your medical condition (cold, fever, infection).  Wear comfortable clothing (specific to your surgery type) to the hospital.  After surgery, you can help prevent lung complications by doing breathing exercises.  Take deep breaths and cough every 1-2 hours. Your doctor may order a device called an Incentive Spirometer to help you take deep breaths.  If you are being discharged the day of surgery, you will not be allowed to drive home. You will need a responsible individual to drive you home and stay with you for 24 hours after surgery.   If you are taking public transportation, you will need to have a responsible individual with you.  Please call the Pre-admissions Testing Dept. at [phone] if you have any questions about these instructions.  Surgery Visitation Policy:  Patients having  surgery or a procedure may have two visitors.  Children under the age of 53 must have an adult with them who is not the patient.     Pre-operative 5 CHG Bath Instructions   You can play a key role in reducing the risk of infection after surgery. Your skin needs to be as free of germs as possible. You can reduce the number of germs on your skin by washing with CHG (chlorhexidine gluconate) soap before surgery. CHG is an antiseptic soap that kills germs  and continues to kill germs even after washing.   DO NOT use if you have an allergy to chlorhexidine/CHG or antibacterial soaps. If your skin becomes reddened or irritated, stop using the CHG and notify one of our RNs at [phone].   Please shower with the CHG soap starting 4 days before surgery using the following schedule:     Please keep in mind the following:  DO NOT shave, including legs and underarms, starting the day of your first shower.   You may shave your face at any point before/day of surgery.  Place clean sheets on your bed the day you start using CHG soap. Use a clean washcloth (not used since being washed) for each shower. DO NOT sleep with pets once you start using the CHG.   CHG Shower Instructions:  If you choose to wash your hair and private area, wash first with your normal shampoo/soap.  After you use shampoo/soap, rinse your hair and body thoroughly to remove shampoo/soap residue.  Turn the water OFF and apply about 3 tablespoons (45 ml) of CHG soap to a CLEAN washcloth.  Apply CHG soap ONLY FROM YOUR NECK DOWN TO YOUR TOES (washing for 3-5 minutes)  DO NOT use CHG soap on face, private areas, open wounds, or sores.  Pay special attention to the area where your surgery is being performed.  If you are having back surgery, having someone wash your back for you may be helpful. Wait 2 minutes after CHG soap is applied, then you may rinse off the CHG soap.  Pat dry with a clean towel  Put on clean clothes/pajamas   If you choose to wear lotion, please use ONLY the CHG-compatible lotions on the back of this paper.     Additional instructions for the day of surgery: DO NOT APPLY any lotions, deodorants, cologne, or perfumes.   Put on clean/comfortable clothes.  Brush your teeth.  Ask your nurse before applying any prescription medications to the skin.      CHG Compatible Lotions   Aveeno Moisturizing lotion  Cetaphil Moisturizing Cream  Cetaphil  Moisturizing Lotion  Clairol Herbal Essence Moisturizing Lotion, Dry Skin  Clairol Herbal Essence Moisturizing Lotion, Extra Dry Skin  Clairol Herbal Essence Moisturizing Lotion, Normal Skin  Curel Age Defying Therapeutic Moisturizing Lotion with Alpha Hydroxy  Curel Extreme Care Body Lotion  Curel Soothing Hands Moisturizing Hand Lotion  Curel Therapeutic Moisturizing Cream, Fragrance-Free  Curel Therapeutic Moisturizing Lotion, Fragrance-Free  Curel Therapeutic Moisturizing Lotion, Original Formula  Eucerin Daily Replenishing Lotion  Eucerin Dry Skin Therapy Plus Alpha Hydroxy Crme  Eucerin Dry Skin Therapy Plus Alpha Hydroxy Lotion  Eucerin Original Crme  Eucerin Original Lotion  Eucerin Plus Crme Eucerin Plus Lotion  Eucerin TriLipid Replenishing Lotion  Keri Anti-Bacterial Hand Lotion  Keri Deep Conditioning Original Lotion Dry Skin Formula Softly Scented  Keri Deep Conditioning Original Lotion, Fragrance Free Sensitive Skin Formula  Keri Lotion Fast Absorbing Fragrance Free Sensitive Skin Formula  Keri Lotion Fast Absorbing Softly Scented Dry Skin Formula  Keri Original Lotion  Keri Skin Renewal Lotion Keri Silky Smooth Lotion  Keri Silky Smooth Sensitive Skin Lotion  Nivea Body Creamy Psychologist, sport and exercise Moisturizing Lotion Nivea Crme  Nivea Skin Firming Lotion  NutraDerm 30 Skin Lotion  NutraDerm Skin Lotion  NutraDerm Therapeutic Skin Cream  NutraDerm Therapeutic Skin Lotion  ProShield Protective Hand Cream  Provon moisturizing lotion

## 2023-02-20 ENCOUNTER — Encounter: Payer: Self-pay | Admitting: Nurse Practitioner

## 2023-02-20 ENCOUNTER — Telehealth: Payer: BC Managed Care – PPO | Admitting: Physician Assistant

## 2023-02-20 DIAGNOSIS — Z712 Person consulting for explanation of examination or test findings: Secondary | ICD-10-CM | POA: Diagnosis not present

## 2023-02-20 NOTE — Progress Notes (Signed)
E-Visit for Urinary Problems  Based on your lab results and current symptoms I do not think you have a UTI.   A UTI (Urinary Tract Infection) is a bacterial infection of the bladder.  Most cases of urinary tract infections are simple to treat but a key part of your care is to encourage you to drink plenty of fluids and watch your symptoms carefully.  Urinary tract infections can be prevented by drinking plenty of water to keep your body hydrated.  Also be sure when you wipe, wipe from front to back and don't hold it in!  If possible, empty your bladder every 4 hours.  HOME CARE Drink plenty of fluids Compete the full course of the antibiotics even if the symptoms resolve Remember, when you need to go.go. Holding in your urine can increase the likelihood of getting a UTI! GET HELP RIGHT AWAY IF: You cannot urinate You get a high fever Worsening back pain occurs You see blood in your urine You feel sick to your stomach or throw up You feel like you are going to pass out  MAKE SURE YOU  Understand these instructions. Will watch your condition. Will get help right away if you are not doing well or get worse.   Thank you for choosing an e-visit.  Your e-visit answers were reviewed by a board certified advanced clinical practitioner to complete your personal care plan. Depending upon the condition, your plan could have included both over the counter or prescription medications.  Please review your pharmacy choice. Make sure the pharmacy is open so you can pick up prescription now. If there is a problem, you may contact your provider through [employer] and have the prescription routed to another pharmacy.  Your safety is important to [place]. If you have drug allergies check your prescription carefully.   For the next 24 hours you can use MyChart to ask questions about today's visit, request a non-urgent call back, or ask for a work or school excuse. You will get an email in the next  two days asking about your experience. I hope that your e-visit has been valuable and will speed your recovery.   I have spent 5 minutes in review of e-visit questionnaire, review and updating patient chart, medical decision making and response to patient.   Tylene Fantasia Ward, PA-C

## 2023-02-22 ENCOUNTER — Telehealth: Payer: Self-pay | Admitting: Nurse Practitioner

## 2023-02-22 NOTE — Telephone Encounter (Signed)
Neurosurgery clearance faxed back to [facility]; [phone]. Scanned-Toni

## 2023-02-23 MED ORDER — LACTATED RINGERS IV SOLN: INTRAVENOUS | Status: DC

## 2023-02-23 MED ORDER — ORAL CARE MOUTH RINSE: 15.0000 mL | Freq: Once | OROMUCOSAL | Status: AC

## 2023-02-23 MED ORDER — CHLORHEXIDINE GLUCONATE 0.12 % MT SOLN
15.0000 mL | Freq: Once | OROMUCOSAL | Status: AC
  Administered 2023-02-24: 15 mL via OROMUCOSAL

## 2023-02-23 MED ORDER — CEFAZOLIN SODIUM-DEXTROSE 2-4 GM/100ML-% IV SOLN: 2.0000 g | INTRAVENOUS | Status: DC

## 2023-02-23 MED ORDER — CEFAZOLIN IN SODIUM CHLORIDE 2-0.9 GM/100ML-% IV SOLN
2.0000 g | Freq: Once | INTRAVENOUS | Status: DC
  Filled 2023-02-23: qty 100

## 2023-02-23 MED ORDER — FAMOTIDINE 20 MG PO TABS
20.0000 mg | ORAL_TABLET | Freq: Once | ORAL | Status: AC
  Administered 2023-02-24: 20 mg via ORAL

## 2023-02-24 ENCOUNTER — Other Ambulatory Visit: Payer: Self-pay

## 2023-02-24 ENCOUNTER — Ambulatory Visit: Payer: BC Managed Care – PPO

## 2023-02-24 ENCOUNTER — Encounter: Payer: Self-pay | Admitting: Neurosurgery

## 2023-02-24 ENCOUNTER — Ambulatory Visit
Admission: RE | Admit: 2023-02-24 | Discharge: 2023-02-24 | Disposition: A | Discharge status: Home / Self Care | Payer: BC Managed Care – PPO | Attending: Neurosurgery | Admitting: Neurosurgery

## 2023-02-24 ENCOUNTER — Encounter: Payer: Self-pay | Admitting: Orthopedic Surgery

## 2023-02-24 ENCOUNTER — Telehealth: Payer: Self-pay

## 2023-02-24 ENCOUNTER — Ambulatory Visit: Payer: BC Managed Care – PPO | Admitting: Urgent Care

## 2023-02-24 ENCOUNTER — Encounter
Admission: RE | Disposition: A | Discharge status: Home / Self Care | Payer: Self-pay | Source: Home / Self Care | Attending: Neurosurgery

## 2023-02-24 ENCOUNTER — Ambulatory Visit: Payer: BC Managed Care – PPO | Admitting: Anesthesiology

## 2023-02-24 DIAGNOSIS — Z01818 Encounter for other preprocedural examination: Secondary | ICD-10-CM

## 2023-02-24 DIAGNOSIS — M5116 Intervertebral disc disorders with radiculopathy, lumbar region: Secondary | ICD-10-CM | POA: Diagnosis not present

## 2023-02-24 DIAGNOSIS — Z791 Long term (current) use of non-steroidal anti-inflammatories (NSAID): Secondary | ICD-10-CM | POA: Diagnosis not present

## 2023-02-24 DIAGNOSIS — Z87891 Personal history of nicotine dependence: Secondary | ICD-10-CM | POA: Diagnosis not present

## 2023-02-24 DIAGNOSIS — F419 Anxiety disorder, unspecified: Secondary | ICD-10-CM | POA: Insufficient documentation

## 2023-02-24 DIAGNOSIS — Z79890 Hormone replacement therapy (postmenopausal): Secondary | ICD-10-CM | POA: Diagnosis not present

## 2023-02-24 DIAGNOSIS — M4726 Other spondylosis with radiculopathy, lumbar region: Secondary | ICD-10-CM | POA: Diagnosis not present

## 2023-02-24 DIAGNOSIS — F32A Depression, unspecified: Secondary | ICD-10-CM | POA: Diagnosis not present

## 2023-02-24 DIAGNOSIS — G4730 Sleep apnea, unspecified: Secondary | ICD-10-CM | POA: Diagnosis not present

## 2023-02-24 DIAGNOSIS — Z79899 Other long term (current) drug therapy: Secondary | ICD-10-CM | POA: Diagnosis not present

## 2023-02-24 DIAGNOSIS — E119 Type 2 diabetes mellitus without complications: Secondary | ICD-10-CM | POA: Diagnosis not present

## 2023-02-24 DIAGNOSIS — G709 Myoneural disorder, unspecified: Secondary | ICD-10-CM | POA: Insufficient documentation

## 2023-02-24 DIAGNOSIS — E039 Hypothyroidism, unspecified: Secondary | ICD-10-CM | POA: Insufficient documentation

## 2023-02-24 DIAGNOSIS — Z7951 Long term (current) use of inhaled steroids: Secondary | ICD-10-CM | POA: Insufficient documentation

## 2023-02-24 DIAGNOSIS — R9431 Abnormal electrocardiogram [ECG] [EKG]: Secondary | ICD-10-CM

## 2023-02-24 DIAGNOSIS — E669 Obesity, unspecified: Secondary | ICD-10-CM | POA: Insufficient documentation

## 2023-02-24 DIAGNOSIS — K219 Gastro-esophageal reflux disease without esophagitis: Secondary | ICD-10-CM | POA: Insufficient documentation

## 2023-02-24 DIAGNOSIS — M5416 Radiculopathy, lumbar region: Secondary | ICD-10-CM

## 2023-02-24 DIAGNOSIS — E785 Hyperlipidemia, unspecified: Secondary | ICD-10-CM | POA: Diagnosis not present

## 2023-02-24 DIAGNOSIS — M5126 Other intervertebral disc displacement, lumbar region: Secondary | ICD-10-CM

## 2023-02-24 DIAGNOSIS — Z01812 Encounter for preprocedural laboratory examination: Secondary | ICD-10-CM

## 2023-02-24 HISTORY — PX: LUMBAR LAMINECTOMY/DECOMPRESSION MICRODISCECTOMY: SHX5026

## 2023-02-24 LAB — ABO/RH: ABO/RH(D): B POS

## 2023-02-24 SURGERY — LUMBAR LAMINECTOMY/DECOMPRESSION MICRODISCECTOMY 1 LEVEL
Anesthesia: General | Site: Spine Lumbar | Laterality: Right

## 2023-02-24 MED ORDER — OXYCODONE HCL 5 MG PO TABS
ORAL_TABLET | ORAL | Status: AC
  Filled 2023-02-24: qty 1

## 2023-02-24 MED ORDER — OXYCODONE HCL 5 MG PO TABS: 5.0000 mg | ORAL_TABLET | ORAL | 0 refills | Status: AC | PRN

## 2023-02-24 MED ORDER — LIDOCAINE HCL (CARDIAC) PF 100 MG/5ML IV SOSY
PREFILLED_SYRINGE | INTRAVENOUS | Status: DC | PRN
  Administered 2023-02-24: 100 mg via INTRAVENOUS

## 2023-02-24 MED ORDER — MIDAZOLAM HCL 2 MG/2ML IJ SOLN
INTRAMUSCULAR | Status: AC
  Filled 2023-02-24: qty 2

## 2023-02-24 MED ORDER — CEFAZOLIN SODIUM-DEXTROSE 2-4 GM/100ML-% IV SOLN
INTRAVENOUS | Status: AC
  Filled 2023-02-24: qty 100

## 2023-02-24 MED ORDER — BUPIVACAINE LIPOSOME 1.3 % IJ SUSP
INTRAMUSCULAR | Status: AC
  Filled 2023-02-24: qty 20

## 2023-02-24 MED ORDER — DEXAMETHASONE SODIUM PHOSPHATE 10 MG/ML IJ SOLN
INTRAMUSCULAR | Status: DC | PRN
  Administered 2023-02-24: 10 mg via INTRAVENOUS

## 2023-02-24 MED ORDER — SODIUM CHLORIDE FLUSH 0.9 % IV SOLN
INTRAVENOUS | Status: AC
  Filled 2023-02-24: qty 10

## 2023-02-24 MED ORDER — METHYLPREDNISOLONE ACETATE 40 MG/ML IJ SUSP
INTRAMUSCULAR | Status: DC | PRN
  Administered 2023-02-24: 40 mg

## 2023-02-24 MED ORDER — GLYCOPYRROLATE 0.2 MG/ML IJ SOLN
INTRAMUSCULAR | Status: DC | PRN
  Administered 2023-02-24: .2 mg via INTRAVENOUS

## 2023-02-24 MED ORDER — FENTANYL CITRATE (PF) 100 MCG/2ML IJ SOLN
INTRAMUSCULAR | Status: AC
  Filled 2023-02-24: qty 2

## 2023-02-24 MED ORDER — METHOCARBAMOL 500 MG PO TABS
ORAL_TABLET | ORAL | Status: AC
  Filled 2023-02-24: qty 1

## 2023-02-24 MED ORDER — SODIUM CHLORIDE (PF) 0.9 % IJ SOLN
INTRAMUSCULAR | Status: DC | PRN
  Administered 2023-02-24: 60 mL via INTRAMUSCULAR

## 2023-02-24 MED ORDER — ACETAMINOPHEN 10 MG/ML IV SOLN
INTRAVENOUS | Status: DC | PRN
  Administered 2023-02-24: 1000 mg via INTRAVENOUS

## 2023-02-24 MED ORDER — OXYCODONE HCL 5 MG PO TABS
5.0000 mg | ORAL_TABLET | Freq: Once | ORAL | Status: AC | PRN
  Administered 2023-02-24: 5 mg via ORAL

## 2023-02-24 MED ORDER — 0.9 % SODIUM CHLORIDE (POUR BTL) OPTIME
TOPICAL | Status: DC | PRN
  Administered 2023-02-24: 500 mL

## 2023-02-24 MED ORDER — OXYCODONE HCL 5 MG/5ML PO SOLN: 5.0000 mg | Freq: Once | ORAL | Status: AC | PRN

## 2023-02-24 MED ORDER — IBUPROFEN 800 MG PO TABS
ORAL_TABLET | ORAL | Status: AC
  Filled 2023-02-24: qty 1

## 2023-02-24 MED ORDER — EPHEDRINE SULFATE (PRESSORS) 50 MG/ML IJ SOLN
INTRAMUSCULAR | Status: DC | PRN
  Administered 2023-02-24 (×2): 5 mg via INTRAVENOUS
  Administered 2023-02-24: 10 mg via INTRAVENOUS

## 2023-02-24 MED ORDER — CHLORHEXIDINE GLUCONATE 0.12 % MT SOLN
OROMUCOSAL | Status: AC
  Filled 2023-02-24: qty 15

## 2023-02-24 MED ORDER — SENNA 8.6 MG PO TABS: 1.0000 | ORAL_TABLET | Freq: Every day | ORAL | 0 refills | Status: DC | PRN

## 2023-02-24 MED ORDER — MIDAZOLAM HCL 2 MG/2ML IJ SOLN
INTRAMUSCULAR | Status: DC | PRN
  Administered 2023-02-24: 2 mg via INTRAVENOUS

## 2023-02-24 MED ORDER — PROPOFOL 10 MG/ML IV BOLUS
INTRAVENOUS | Status: AC
  Filled 2023-02-24: qty 20

## 2023-02-24 MED ORDER — REMIFENTANIL HCL 1 MG IV SOLR
INTRAVENOUS | Status: AC
  Filled 2023-02-24: qty 1000

## 2023-02-24 MED ORDER — ACETAMINOPHEN 10 MG/ML IV SOLN
INTRAVENOUS | Status: AC
  Filled 2023-02-24: qty 100

## 2023-02-24 MED ORDER — LACTATED RINGERS IV SOLN: INTRAVENOUS | Status: DC | PRN

## 2023-02-24 MED ORDER — DROPERIDOL 2.5 MG/ML IJ SOLN
0.6250 mg | INTRAMUSCULAR | Status: AC
  Administered 2023-02-24: 0.625 mg via INTRAVENOUS

## 2023-02-24 MED ORDER — FENTANYL CITRATE (PF) 100 MCG/2ML IJ SOLN: 25.0000 ug | INTRAMUSCULAR | Status: DC | PRN

## 2023-02-24 MED ORDER — METHYLPREDNISOLONE ACETATE 40 MG/ML IJ SUSP
INTRAMUSCULAR | Status: AC
  Filled 2023-02-24: qty 1

## 2023-02-24 MED ORDER — FENTANYL CITRATE (PF) 100 MCG/2ML IJ SOLN
INTRAMUSCULAR | Status: DC | PRN
  Administered 2023-02-24 (×2): 50 ug via INTRAVENOUS

## 2023-02-24 MED ORDER — BUPIVACAINE HCL (PF) 0.5 % IJ SOLN
INTRAMUSCULAR | Status: AC
  Filled 2023-02-24: qty 30

## 2023-02-24 MED ORDER — DROPERIDOL 2.5 MG/ML IJ SOLN
INTRAMUSCULAR | Status: AC
  Filled 2023-02-24: qty 2

## 2023-02-24 MED ORDER — METHOCARBAMOL 500 MG PO TABS
500.0000 mg | ORAL_TABLET | Freq: Four times a day (QID) | ORAL | Status: DC | PRN
  Administered 2023-02-24: 500 mg via ORAL

## 2023-02-24 MED ORDER — FAMOTIDINE 20 MG PO TABS
ORAL_TABLET | ORAL | Status: AC
  Filled 2023-02-24: qty 1

## 2023-02-24 MED ORDER — PHENYLEPHRINE HCL (PRESSORS) 10 MG/ML IV SOLN
INTRAVENOUS | Status: AC
  Filled 2023-02-24: qty 1

## 2023-02-24 MED ORDER — PROPOFOL 10 MG/ML IV BOLUS
INTRAVENOUS | Status: DC | PRN
  Administered 2023-02-24: 200 mg via INTRAVENOUS

## 2023-02-24 MED ORDER — ACETAMINOPHEN 10 MG/ML IV SOLN: 1000.0000 mg | Freq: Once | INTRAVENOUS | Status: DC | PRN

## 2023-02-24 MED ORDER — SURGIFLO WITH THROMBIN (HEMOSTATIC MATRIX KIT) OPTIME
TOPICAL | Status: DC | PRN
  Administered 2023-02-24: 1 via TOPICAL

## 2023-02-24 MED ORDER — IBUPROFEN 800 MG PO TABS
800.0000 mg | ORAL_TABLET | ORAL | Status: AC
  Administered 2023-02-24: 800 mg via ORAL
  Filled 2023-02-24: qty 1

## 2023-02-24 MED ORDER — PHENYLEPHRINE 80 MCG/ML (10ML) SYRINGE FOR IV PUSH (FOR BLOOD PRESSURE SUPPORT)
PREFILLED_SYRINGE | INTRAVENOUS | Status: DC | PRN
  Administered 2023-02-24: 80 ug via INTRAVENOUS
  Administered 2023-02-24 (×2): 160 ug via INTRAVENOUS

## 2023-02-24 MED ORDER — SUCCINYLCHOLINE CHLORIDE 200 MG/10ML IV SOSY
PREFILLED_SYRINGE | INTRAVENOUS | Status: DC | PRN
  Administered 2023-02-24: 120 mg via INTRAVENOUS

## 2023-02-24 MED ORDER — ONDANSETRON HCL 4 MG/2ML IJ SOLN: 4.0000 mg | Freq: Once | INTRAMUSCULAR | Status: DC | PRN

## 2023-02-24 MED ORDER — BUPIVACAINE-EPINEPHRINE 0.5% -1:200000 IJ SOLN
INTRAMUSCULAR | Status: DC | PRN
  Administered 2023-02-24: 8 mL

## 2023-02-24 MED ORDER — PROPOFOL 1000 MG/100ML IV EMUL
INTRAVENOUS | Status: AC
  Filled 2023-02-24: qty 100

## 2023-02-24 MED ORDER — METHOCARBAMOL 500 MG PO TABS: 500.0000 mg | ORAL_TABLET | Freq: Four times a day (QID) | ORAL | 0 refills | Status: DC

## 2023-02-24 MED ORDER — ONDANSETRON HCL 4 MG/2ML IJ SOLN
INTRAMUSCULAR | Status: DC | PRN
  Administered 2023-02-24 (×2): 4 mg via INTRAVENOUS

## 2023-02-24 SURGICAL SUPPLY — 43 items
ADH SKN CLS APL DERMABOND .7 (GAUZE/BANDAGES/DRESSINGS) ×1
AGENT HMST KT MTR STRL THRMB (HEMOSTASIS) ×1
BASIN KIT SINGLE STR (MISCELLANEOUS) ×1 IMPLANT
BUR NEURO DRILL SOFT 3.0X3.8M (BURR) ×1 IMPLANT
CNTNR URN SCR LID CUP LEK RST (MISCELLANEOUS) ×1 IMPLANT
CONT SPEC 4OZ STRL OR WHT (MISCELLANEOUS) ×1
DERMABOND ADVANCED .7 DNX12 (GAUZE/BANDAGES/DRESSINGS) ×1 IMPLANT
DRAPE C ARM PK CFD 31 SPINE (DRAPES) ×1 IMPLANT
DRAPE LAPAROTOMY 100X77 ABD (DRAPES) ×1 IMPLANT
DRAPE MICROSCOPE SPINE 48X150 (DRAPES) IMPLANT
DRSG OPSITE POSTOP 4X6 (GAUZE/BANDAGES/DRESSINGS) IMPLANT
ELECT EZSTD 165MM 6.5IN (MISCELLANEOUS) ×1
ELECT REM PT RETURN 9FT ADLT (ELECTROSURGICAL) ×1
ELECTRODE EZSTD 165MM 6.5IN (MISCELLANEOUS) ×1 IMPLANT
ELECTRODE REM PT RTRN 9FT ADLT (ELECTROSURGICAL) ×1 IMPLANT
GLOVE BIOGEL PI IND STRL 6.5 (GLOVE) ×1 IMPLANT
GLOVE SURG SYN 6.5 ES PF (GLOVE) ×1 IMPLANT
GLOVE SURG SYN 6.5 PF PI (GLOVE) ×1 IMPLANT
GLOVE SURG SYN 8.5 E (GLOVE) ×3 IMPLANT
GLOVE SURG SYN 8.5 PF PI (GLOVE) ×3 IMPLANT
GOWN SRG LRG LVL 4 IMPRV REINF (GOWNS) ×1 IMPLANT
GOWN SRG XL LVL 3 NONREINFORCE (GOWNS) ×1 IMPLANT
GOWN STRL NON-REIN TWL XL LVL3 (GOWNS) ×1
GOWN STRL REIN LRG LVL4 (GOWNS) ×1
GRAFT DURAGEN MATRIX 1WX1L (Tissue) IMPLANT
KIT SPINAL PRONEVIEW (KITS) ×1 IMPLANT
MANIFOLD NEPTUNE II (INSTRUMENTS) ×1 IMPLANT
MARKER SKIN DUAL TIP RULER LAB (MISCELLANEOUS) ×1 IMPLANT
NDL SAFETY ECLIP 18X1.5 (MISCELLANEOUS) ×1 IMPLANT
NS IRRIG 1000ML POUR BTL (IV SOLUTION) ×1 IMPLANT
NS IRRIG 500ML POUR BTL (IV SOLUTION) IMPLANT
PACK LAMINECTOMY ARMC (PACKS) ×1 IMPLANT
SOLUTION IRRIG SURGIPHOR (IV SOLUTION) IMPLANT
SURGIFLO W/THROMBIN 8M KIT (HEMOSTASIS) ×1 IMPLANT
SUT DVC VLOC 3-0 CL 6 P-12 (SUTURE) ×1 IMPLANT
SUT VIC AB 0 CT1 27 (SUTURE) ×1
SUT VIC AB 0 CT1 27XCR 8 STRN (SUTURE) ×1 IMPLANT
SUT VIC AB 2-0 CT1 18 (SUTURE) ×1 IMPLANT
SYR 30ML LL (SYRINGE) ×2 IMPLANT
SYR 3ML LL SCALE MARK (SYRINGE) ×1 IMPLANT
TRAP FLUID SMOKE EVACUATOR (MISCELLANEOUS) ×1 IMPLANT
WATER STERILE IRR 1000ML POUR (IV SOLUTION) ×2 IMPLANT
WATER STERILE IRR 500ML POUR (IV SOLUTION) IMPLANT

## 2023-02-24 NOTE — Progress Notes (Addendum)
She had Right L5/S1 microdiscectomy today (02/24/23).   Per Duwayne Heck, they had to do a lot of nerve manipulation in the OR to get her decompressed. No obvious CSF leak, but will need to watch for drainage or headaches postop. She will likely have a lot of numbness postop as well.

## 2023-02-24 NOTE — Telephone Encounter (Signed)
Received fax from [facility] regarding Oxycodone 5mg  tablets  "Product back ordered/unavailable: we do not have this in stock--consider changing to new medication or send to other pharmacy".

## 2023-02-24 NOTE — Anesthesia Postprocedure Evaluation (Signed)
Anesthesia Post Note  Patient: Sherry Evans  Procedure(s) Performed: RIGHT L5-S1 MICRODISCECTOMY (Right: Spine Lumbar)  Patient location during evaluation: PACU Anesthesia Type: General Level of consciousness: awake and alert Pain management: pain level controlled Vital Signs Assessment: post-procedure vital signs reviewed and stable Respiratory status: spontaneous breathing, nonlabored ventilation, respiratory function stable and patient connected to nasal cannula oxygen Cardiovascular status: blood pressure returned to baseline and stable Postop Assessment: no apparent nausea or vomiting Anesthetic complications: no   No notable events documented.   Last Vitals:  Vitals:   02/24/23 1200 02/24/23 1221  BP: 120/72 (!) 111/56  Pulse: 86 84  Resp: 13 15  Temp: 36.9 C 36.7 C  SpO2: 94% 96%    Last Pain:  Vitals:   02/24/23 1221  TempSrc: Temporal  PainSc: 2                  Corinda Gubler

## 2023-02-24 NOTE — Transfer of Care (Signed)
Immediate Anesthesia Transfer of Care Note  Patient: Sherry Evans  Procedure(s) Performed: RIGHT L5-S1 MICRODISCECTOMY (Right: Spine Lumbar)  Patient Location: PACU  Anesthesia Type:General  Level of Consciousness: drowsy and patient cooperative  Airway & Oxygen Therapy: Patient Spontanous Breathing and Patient connected to face mask oxygen  Post-op Assessment: Report given to RN and Post -op Vital signs reviewed and stable  Post vital signs: Reviewed and stable  Last Vitals:  Vitals Value Taken Time  BP    Temp    Pulse 96 02/24/23 1108  Resp 18 02/24/23 1108  SpO2 96 % 02/24/23 1108  Vitals shown include unvalidated device data.  Last Pain:  Vitals:   02/24/23 0837  TempSrc: Temporal  PainSc: 0-No pain      Patients Stated Pain Goal: 0 (02/24/23 0837)  Complications: No notable events documented.

## 2023-02-24 NOTE — Discharge Instructions (Addendum)
Your surgeon has performed an operation on your lumbar spine (low back) to relieve pressure on one or more nerves. Many times, patients feel better immediately after surgery and can "overdo it." Even if you feel well, it is important that you follow these activity guidelines. If you do not let your back heal properly from the surgery, you can increase the chance of a disc herniation and/or return of your symptoms. The following are instructions to help in your recovery once you have been discharged from the hospital.  * It is ok to take NSAIDs after surgery.  Activity    No bending, lifting, or twisting ("BLT"). Avoid lifting objects heavier than 10 pounds (gallon milk jug).  Where possible, avoid household activities that involve lifting, bending, pushing, or pulling such as laundry, vacuuming, grocery shopping, and childcare. Try to arrange for help from friends and family for these activities while your back heals.  Increase physical activity slowly as tolerated.  Taking short walks is encouraged, but avoid strenuous exercise. Do not jog, run, bicycle, lift weights, or participate in any other exercises unless specifically allowed by your doctor. Avoid prolonged sitting, including car rides.  Talk to your doctor before resuming sexual activity.  You should not drive until cleared by your doctor.  Until released by your doctor, you should not return to work or school.  You should rest at home and let your body heal.   You may shower three days after your surgery.  After showering, lightly dab your incision dry. Do not take a tub bath or go swimming for 3 weeks, or until approved by your doctor at your follow-up appointment.  If you smoke, we strongly recommend that you quit.  Smoking has been proven to interfere with normal healing in your back and will dramatically reduce the success rate of your surgery. Please contact [facility] (800-QUIT-NOW) and use the resources at www.[facility].com for  assistance in stopping smoking.  Surgical Incision   If you have a dressing on your incision, you may remove it three days after your surgery. Keep your incision area clean and dry.  If you have staples or stitches on your incision, you should have a follow up scheduled for removal. If you do not have staples or stitches, you will have steri-strips (small pieces of surgical tape) or Dermabond glue. The steri-strips/glue should begin to peel away within about a week (it is fine if the steri-strips fall off before then). If the strips are still in place one week after your surgery, you may gently remove them.  Diet            You may return to your usual diet. Be sure to stay hydrated.  When to Contact Us  Although your surgery and recovery will likely be uneventful, you may have some residual numbness, aches, and pains in your back and/or legs. This is normal and should improve in the next few weeks.  However, should you experience any of the following, contact us immediately: New numbness or weakness Pain that is progressively getting worse, and is not relieved by your pain medications or rest Bleeding, redness, swelling, pain, or drainage from surgical incision Chills or flu-like symptoms Fever greater than 101.0 F (38.3 C) Problems with bowel or bladder functions Difficulty breathing or shortness of breath Warmth, tenderness, or swelling in your calf  Contact Information During office hours (Monday-Friday 9 am to 5 pm), please call your physician at [phone] and ask for Kendelyn Jean After hours and   weekends, please call [phone] and speak with the neurosurgeon on call For a life-threatening emergency, call 911   AMBULATORY SURGERY  DISCHARGE INSTRUCTIONS   The drugs that you were given will stay in your system until tomorrow so for the next 24 hours you should not:  Drive an automobile Make any legal decisions Drink any alcoholic beverage   You may resume regular  meals tomorrow.  Today it is better to start with liquids and gradually work up to solid foods.  You may eat anything you prefer, but it is better to start with liquids, then soup and crackers, and gradually work up to solid foods.   Please notify your doctor immediately if you have any unusual bleeding, trouble breathing, redness and pain at the surgery site, drainage, fever, or pain not relieved by medication.    Additional Instructions:        Please contact your physician with any problems or Same Day Surgery at [phone], Monday through Friday 6 am to 4 pm, or [place] [facility] number at [phone]. 

## 2023-02-24 NOTE — Telephone Encounter (Signed)
Sent fax back to [facility] noting that we resent rx to [facility] [address].

## 2023-02-24 NOTE — Discharge Summary (Signed)
Discharge Summary  Patient ID: Sherry Evans MRN: [id number] DOB/AGE: [birth year] 62 y.o.  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 *   Discharged Condition: good  Hospital Course:  Sherry Evans is a 62 y.o presenting with right-sided lumbar radiculopathy status post right L5-S1 microdiscectomy.  Her intraoperative course was uncomplicated.  She was monitored in PACU and discharged home after ambulating, urinating, and tolerating p.o. intake.  She was given prescriptions for oxycodone and Robaxin.  Postoperative instructions were reviewed.  Consults:  None  Significant Diagnostic Studies: None  Treatments: surgery: As above.  Please see separately dictated operative report for further details  Discharge Exam: Blood pressure (!) 119/54, pulse (!) 56, temperature 98.5 F (36.9 C), temperature source Temporal, resp. rate 16, height 5\' 5"  (1.651 m), weight 101.2 kg, last menstrual period 12/21/2015, SpO2 98 %. CN II-XII grossly intact 5/5 throughout BLE Incision c/d/i  Disposition: Discharge disposition: 01-Home or Self Care        Allergies as of 02/24/2023       Reactions   Azithromycin Itching, Rash   Sulfa Antibiotics Rash, Itching        Medication List     STOP taking these medications    HYDROcodone-acetaminophen 7.5-325 MG tablet Commonly known as: NORCO       TAKE these medications    budesonide 0.5 MG/2ML nebulizer solution Commonly known as: PULMICORT Take 0.5 mg by nebulization 2 (two) times daily as needed. Nasal rinse (not used as nebulizer)   docusate sodium 100 MG capsule Commonly known as: COLACE Take 100 mg by mouth daily as needed for mild constipation.   gabapentin 100 MG capsule Commonly known as: NEURONTIN Take 1 capsule (100 mg total) by mouth 2 (two)  times daily with breakfast and lunch. What changed:  how much to take when to take this   gabapentin 600 MG tablet Commonly known as: NEURONTIN TAKE 3 TABLETS BY MOUTH AT EVENING MEAL What changed: Another medication with the same name was changed. Make sure you understand how and when to take each.   ketoconazole 2 % cream Commonly known as: NIZORAL Apply 1 application topically daily.   levocetirizine 5 MG tablet Commonly known as: XYZAL Take 1 tablet (5 mg total) by mouth daily.   levothyroxine 100 MCG tablet Commonly known as: SYNTHROID Take 1 tablet (100 mcg total) by mouth daily.   linaclotide 145 MCG Caps capsule Commonly known as: Linzess TAKE 1 CAPSULE BY MOUTH DAILY BEFORE BREAKFAST.   Magnesium 400 MG Caps Take 2 capsules by mouth daily.   meloxicam 15 MG tablet Commonly known as: MOBIC Take 1 tablet (15 mg total) by mouth daily.   methocarbamol 500 MG tablet Commonly known as: ROBAXIN Take 1 tablet (500 mg total) by mouth 4 (four) times daily.   montelukast 10 MG tablet Commonly known as: SINGULAIR TAKE 1 TABLET BY MOUTH DAILY FOR ALLERGIES   MULTIPLE VITAMIN PO Take 2 capsules by mouth daily.   nystatin-triamcinolone ointment Commonly known as: MYCOLOG Apply topically 2 (two) times daily as needed.   oxyCODONE 5 MG immediate release tablet Commonly known as: Roxicodone Take 1 tablet (5 mg total) by mouth every 4 (four) hours as needed for up to 7 days for severe pain.   senna 8.6 MG Tabs tablet Commonly known as: SENOKOT Take 1 tablet (8.6 mg total)  by mouth daily as needed for mild constipation.   senna-docusate 8.6-50 MG tablet Commonly known as: Senokot-S Take 4 tablets by mouth daily as needed for mild constipation.   sertraline 50 MG tablet Commonly known as: ZOLOFT TAKE 1 TABLET BY MOUTH EVERY DAY What changed: when to take this   traZODone 50 MG tablet Commonly known as: DESYREL TAKE 1 TABLET (50 MG TOTAL) BY MOUTH AT BEDTIME AS  NEEDED.   TURMERIC-GINGER PO Take 2 tablets by mouth daily.   VITAMIN D-VITAMIN K PO Take 1 capsule by mouth daily.   vitamin E 1000 UNIT capsule Take 1,000 Units by mouth daily.   zinc gluconate 50 MG tablet Take 50 mg by mouth daily.         Signed: Susanne Borders 02/24/2023, 11:02 AM

## 2023-02-24 NOTE — Anesthesia Procedure Notes (Signed)
Procedure Name: Intubation Date/Time: 02/24/2023 9:02 AM  Performed by: Mohammed Kindle, CRNAPre-anesthesia Checklist: Patient identified, Emergency Drugs available, Suction available and Patient being monitored Patient Re-evaluated:Patient Re-evaluated prior to induction Oxygen Delivery Method: Circle system utilized Preoxygenation: Pre-oxygenation with 100% oxygen Induction Type: IV induction Ventilation: Mask ventilation without difficulty Laryngoscope Size: McGraph and 3 Grade View: Grade I Tube type: Oral Tube size: 7.0 mm Airway Equipment and Method: Stylet Placement Confirmation: ETT inserted through vocal cords under direct vision, positive ETCO2, CO2 detector and breath sounds checked- equal and bilateral Secured at: 21 cm Tube secured with: Tape Dental Injury: Teeth and Oropharynx as per pre-operative assessment

## 2023-02-24 NOTE — Anesthesia Preprocedure Evaluation (Signed)
Anesthesia Evaluation  Patient identified by MRN, date of birth, ID band Patient awake    Reviewed: Allergy & Precautions, NPO status , Patient's Chart, lab work & pertinent test results  History of Anesthesia Complications Negative for: history of anesthetic complications  Airway Mallampati: II  TM Distance: >3 FB Neck ROM: Full    Dental no notable dental hx. (+) Teeth Intact   Pulmonary neg pulmonary ROS, neg sleep apnea, neg COPD, Patient abstained from smoking.Not current smoker   Pulmonary exam normal breath sounds clear to auscultation       Cardiovascular Exercise Tolerance: Good METS(-) hypertension(-) CAD and (-) Past MI negative cardio ROS (-) dysrhythmias  Rhythm:Regular Rate:Normal - Systolic murmurs    Neuro/Psych  PSYCHIATRIC DISORDERS Anxiety Depression    On hydrocodone at home  Neuromuscular disease    GI/Hepatic ,GERD  Medicated and Controlled,,(+)     (-) substance abuse    Endo/Other  neg diabetesHypothyroidism    Renal/GU negative Renal ROS     Musculoskeletal   Abdominal   Peds  Hematology   Anesthesia Other Findings Past Medical History: No date: Abnormal EKG No date: Anxiety No date: DDD (degenerative disc disease), lumbar No date: Depression No date: GERD (gastroesophageal reflux disease) No date: History of sinus surgery     Comment:  3 times No date: Hyperlipidemia No date: Hypothyroidism No date: Irritable bowel syndrome with constipation No date: Lumbar radiculitis No date: Obesity No date: Palpitations No date: Sleep apnea     Comment:  not needed after weight loss surgery No date: Trigger finger  Reproductive/Obstetrics                             Anesthesia Physical Anesthesia Plan  ASA: 2  Anesthesia Plan: General   Post-op Pain Management: Ofirmev IV (intra-op)*   Induction: Intravenous  PONV Risk Score and Plan: 3 and Ondansetron,  Dexamethasone and Midazolam  Airway Management Planned: Oral ETT  Additional Equipment: None  Intra-op Plan:   Post-operative Plan: Extubation in OR  Informed Consent: I have reviewed the patients History and Physical, chart, labs and discussed the procedure including the risks, benefits and alternatives for the proposed anesthesia with the patient or authorized representative who has indicated his/her understanding and acceptance.     Dental advisory given  Plan Discussed with: CRNA and Surgeon  Anesthesia Plan Comments: (Discussed risks of anesthesia with patient, including PONV, sore throat, lip/dental/eye damage. Rare risks discussed as well, such as cardiorespiratory and neurological sequelae, and allergic reactions. Discussed the role of CRNA in patient's perioperative care. Patient understands.)       Anesthesia Quick Evaluation

## 2023-02-24 NOTE — Op Note (Signed)
Indications: [name] is suffering from lumbar radiculopathy. The patient tried and failed conservative management, prompting surgical intervention.  She had a prior discectomy at this level.  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   Preoperative Note:   Risks of surgery discussed include: infection, bleeding, stroke, coma, death, paralysis, CSF leak, nerve/spinal cord injury, numbness, tingling, weakness, complex regional pain syndrome, recurrent stenosis and/or disc herniation, vascular injury, development of instability, neck/back pain, need for further surgery, persistent symptoms, development of deformity, and the risks of anesthesia. The patient understood these risks and agreed to proceed.  Operative Note:   1) Right L5/S1 microdiscectomy  The patient was then brought from the preoperative center with intravenous access established.  The patient underwent general anesthesia and endotracheal tube intubation, and was then rotated on the [place] rail top where all pressure points were appropriately padded.  The skin was then thoroughly cleansed.  Perioperative antibiotic prophylaxis was administered.  Sterile prep and drapes were then applied and a timeout was then observed.  C-arm was brought into the field under sterile conditions, and the L5-S1 disc space identified and marked with an incision on the right 1cm lateral to midline.  Once this was complete a 2 cm incision was opened with the use of a #10 blade knife.  The Metrx tubes were sequentially advanced under lateral fluoroscopy until a 18 x 70 mm Metrx tube was placed over the facet and lamina and secured to the bed.    The microscope was then  sterilely brought into the field and muscle creep was hemostased with a bipolar and resected with a pituitary rongeur.  A Bovie extender was then used to expose the remaining spinous process and lamina.  Careful attention was placed to not violate the facet capsule. A 3 mm matchstick drill bit was then used to expand the hemi-laminotomy trough until the ligamentum flavum was exposed.  This was extended to the base of the spinous process.  Once this was complete and the underlying ligamentum flavum was visualized, the ligamentum was dissected with an up angle curette and resected with a #2 and #3 mm biting Kerr[place].  The laminotomy opening was also expanded in similar fashion and hemostasis was obtained with Surgifoam and a patty as well as bone wax.  Once the underlying dura was visualized a Penfield 4 was then used to dissect and expose the traversing nerve root.  Once this was identified a nerve root retractor suction was used to mobilize this medially.  A large focal disc herniation was identified.    The disc herniation was identified and dissected free using a balltip probe. The pituitary rongeur was used to remove the extruded disc fragments. There was a calcified portion medially which took a substantial amount of time to dissect free. Once the thecal sac and nerve root were noted to be relaxed and under less tension the ball-tipped feeler was passed along the foramen distally to ensure no residual compression was noted.    Depo-Medrol was placed along the nerve root.  The area was irrigated. The tube system was then removed under microscopic visualization and hemostasis was obtained with a bipolar.    The fascial layer was reapproximated with the use of a 0- Vicryl suture.  Subcutaneous  tissue layer was reapproximated using 2-0 Vicryl suture.  3-0 monocryl was used on the skin. The skin was then cleansed and Dermabond was used to close the skin opening.  Patient was then rotated back to the  preoperative bed awakened from anesthesia and taken to recovery all counts are correct in this case.   I performed the entire procedure with the assistance of Manning Charity PA as an Designer, television/film set. An assistant was required for this procedure due to the complexity.  The assistant provided assistance in tissue manipulation and suction, and was required for the successful and safe performance of the procedure. I performed the critical portions of the procedure.   Venetia Night MD

## 2023-02-24 NOTE — Interval H&P Note (Signed)
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  The various methods of treatment have been discussed with the patient and family. After consideration of risks, benefits and other options for treatment, the patient has consented to  Procedure(s): RIGHT L5-S1 MICRODISCECTOMY (Right) as a surgical intervention.  The patient's history has been reviewed, patient examined, no change in status, stable for surgery.  I have reviewed the patient's chart and labs.  Questions were answered to the patient's satisfaction.    Heart sounds normal no MRG. Chest Clear to Auscultation Bilaterally.  [name]

## 2023-03-11 ENCOUNTER — Encounter: Payer: Self-pay | Admitting: Neurosurgery

## 2023-03-11 ENCOUNTER — Ambulatory Visit (INDEPENDENT_AMBULATORY_CARE_PROVIDER_SITE_OTHER): Payer: BC Managed Care – PPO | Admitting: Neurosurgery

## 2023-03-11 VITALS — BP 122/78 | Temp 99.4°F | Ht 65.0 in | Wt 223.0 lb

## 2023-03-11 DIAGNOSIS — Z9889 Other specified postprocedural states: Secondary | ICD-10-CM

## 2023-03-11 DIAGNOSIS — Z09 Encounter for follow-up examination after completed treatment for conditions other than malignant neoplasm: Secondary | ICD-10-CM

## 2023-03-11 DIAGNOSIS — M5116 Intervertebral disc disorders with radiculopathy, lumbar region: Secondary | ICD-10-CM

## 2023-03-11 NOTE — Progress Notes (Signed)
   REFERRING PHYSICIAN:  Burtis Junes [address] [place],  [place] [address]  DOS: 02/24/23 right L5-S1 microdiscectomy   HISTORY OF PRESENT ILLNESS: Sherry Evans is approximately 2 weeks status post lumbar microdiscectomy. she is doing well with complete resolution of her preoperative right leg pain!  She is not currently taking anything for pain.  She denies any incisional concerns.  PHYSICAL EXAMINATION:  General: Patient is well developed, well nourished, calm, collected, and in no apparent distress.   NEUROLOGICAL:  General: In no acute distress.   Awake, alert, oriented to person, place, and time.  Pupils equal round and reactive to light.    Strength:            Side Iliopsoas Quads Hamstring PF DF EHL  R 5 5 5 5 5 5   L 5 5 5 5 5 5    Incision c/d/I and healing well   ROS (Neurologic):  Negative except as noted above  IMAGING: No interval imaging to review  ASSESSMENT/PLAN:  Sherry Evans is doing well approximately 2 weeks after microdiscectomy.  We discussed activity escalation in detail and I have advised the patient to lift up to 10 pounds until 6 weeks after surgery, then increase up to 25 pounds until 12 weeks after surgery.  After 12 weeks post-op, the patient advised to increase activity as tolerated.  She is going to the beach the week of July 4.  We discussed that she should not get in the ocean.  She will send [place] a picture of her incision prior to her trip and we will clear her to go in the pool if her incision is completely healed.  If it is not completely healed we will advise her not to submerge her incision.  she will follow up 6 weeks with Dr. Marcell Barlow or sooner should she have any questions or concerns.  She expressed understanding and was in agreement with this plan.  Manning Charity PA-C Department of neurosurgery

## 2023-04-05 ENCOUNTER — Encounter: Payer: Self-pay | Admitting: Nurse Practitioner

## 2023-04-08 ENCOUNTER — Ambulatory Visit (INDEPENDENT_AMBULATORY_CARE_PROVIDER_SITE_OTHER): Payer: BC Managed Care – PPO | Admitting: Neurosurgery

## 2023-04-08 ENCOUNTER — Encounter: Payer: Self-pay | Admitting: Neurosurgery

## 2023-04-08 VITALS — BP 112/74 | HR 75 | Wt 226.6 lb

## 2023-04-08 DIAGNOSIS — M5116 Intervertebral disc disorders with radiculopathy, lumbar region: Secondary | ICD-10-CM

## 2023-04-08 DIAGNOSIS — M5416 Radiculopathy, lumbar region: Secondary | ICD-10-CM

## 2023-04-08 DIAGNOSIS — Z09 Encounter for follow-up examination after completed treatment for conditions other than malignant neoplasm: Secondary | ICD-10-CM

## 2023-04-08 NOTE — Progress Notes (Signed)
   REFERRING PHYSICIAN:  No referring provider defined for this encounter.  DOS: 02/24/23 right L5-S1 microdiscectomy   HISTORY OF PRESENT ILLNESS: Sherry Evans is status post lumbar microdiscectomy.   She is doing extremely well.  PHYSICAL EXAMINATION:  General: Patient is well developed, well nourished, calm, collected, and in no apparent distress.   NEUROLOGICAL:  General: In no acute distress.   Awake, alert, oriented to person, place, and time.  Pupils equal round and reactive to light.    Strength:            Side Iliopsoas Quads Hamstring PF DF EHL  R 5 5 5 5 5 5   L 5 5 5 5 5 5    Incision c/d/I and healing well   ROS (Neurologic):  Negative except as noted above  IMAGING: No interval imaging to review  ASSESSMENT/PLAN:  Sherry Evans is doing well after microdiscectomy.    We reviewed her activity limitations.  She is doing extremely well.  Will see her back in 6 weeks.    Venetia Night MD Department of neurosurgery

## 2023-04-27 ENCOUNTER — Encounter: Payer: Self-pay | Admitting: Nurse Practitioner

## 2023-04-27 ENCOUNTER — Telehealth: Payer: BC Managed Care – PPO | Admitting: Nurse Practitioner

## 2023-04-27 VITALS — Ht 65.0 in | Wt 225.0 lb

## 2023-04-27 DIAGNOSIS — J0110 Acute frontal sinusitis, unspecified: Secondary | ICD-10-CM

## 2023-04-27 MED ORDER — DOXYCYCLINE HYCLATE 100 MG PO TABS
100.0000 mg | ORAL_TABLET | Freq: Two times a day (BID) | ORAL | 0 refills | Status: DC
Start: 2023-04-27 — End: 2023-05-18

## 2023-04-27 NOTE — Progress Notes (Signed)
[facility] [address] [place], [place] [address]  Internal MEDICINE  Telephone Visit  Patient Name: Sherry Evans  [id number]  [id number]  Date of Service: 04/27/2023  I connected with the patient at 1220 by telephone and verified the patients identity using two identifiers.   I discussed the limitations, risks, security and privacy concerns of performing an evaluation and management service by telephone and the availability of in person appointments. I also discussed with the patient that there may be a patient responsible charge related to the service.  The patient expressed understanding and agrees to proceed.    Chief Complaint  Patient presents with   Telephone Assessment    [id number]   Telephone Screen    Allergy attack sneezing and cold sore under nose  and swollen nodes    Sinusitis    HPI [name] presents for a telehealth virtual visit for symptoms of sinusitis. Sneezing, runny nose, cold sores under nose, swollen lymph nodes on left side of neck.  Symptoms started last Friday. But seem to be improving   Current Medication: Outpatient Encounter Medications as of 04/27/2023  Medication Sig   budesonide (PULMICORT) 0.5 MG/2ML nebulizer solution Take 0.5 mg by nebulization 2 (two) times daily as needed. Nasal rinse (not used as nebulizer)   docusate sodium (COLACE) 100 MG capsule Take 100 mg by mouth daily as needed for mild constipation.   doxycycline (VIBRA-TABS) 100 MG tablet Take 1 tablet (100 mg total) by mouth 2 (two) times daily. Take with food   gabapentin (NEURONTIN) 600 MG tablet TAKE 3 TABLETS BY MOUTH AT EVENING MEAL (Patient taking differently: TAKE 2 TABLETS BY MOUTH AT EVENING MEAL)   ketoconazole (NIZORAL) 2 % cream Apply 1 application topically daily.   levocetirizine (XYZAL) 5 MG tablet Take 1 tablet (5 mg total) by mouth daily.   levothyroxine (SYNTHROID) 100 MCG tablet Take 1 tablet (100 mcg total) by mouth daily.   linaclotide (LINZESS)  145 MCG CAPS capsule TAKE 1 CAPSULE BY MOUTH DAILY BEFORE BREAKFAST.   Magnesium 400 MG CAPS Take 2 capsules by mouth daily.   meloxicam (MOBIC) 15 MG tablet Take 1 tablet (15 mg total) by mouth daily.   montelukast (SINGULAIR) 10 MG tablet TAKE 1 TABLET BY MOUTH DAILY FOR ALLERGIES   MULTIPLE VITAMIN PO Take 2 capsules by mouth daily.   nystatin-triamcinolone ointment (MYCOLOG) Apply topically 2 (two) times daily as needed.   sertraline (ZOLOFT) 50 MG tablet TAKE 1 TABLET BY MOUTH EVERY DAY (Patient taking differently: Take 50 mg by mouth at bedtime. Take 1/2 tablet daily)   traZODone (DESYREL) 50 MG tablet TAKE 1 TABLET (50 MG TOTAL) BY MOUTH AT BEDTIME AS NEEDED. (Patient taking differently: Take 2 tablets at bedtime)   TURMERIC-GINGER PO Take 2 tablets by mouth daily.   VITAMIN D-VITAMIN K PO Take 1 capsule by mouth daily.   vitamin E 1000 UNIT capsule Take 1,000 Units by mouth daily.   zinc gluconate 50 MG tablet Take 50 mg by mouth daily.   No facility-administered encounter medications on file as of 04/27/2023.    Surgical History: Past Surgical History:  Procedure Laterality Date   ADENOIDECTOMY  1969   BICEPT TENODESIS Right 04/02/2016   Procedure: BICEPS TENODESIS;  Surgeon: Christena Flake, MD;  Location: [facility] ORS;  Service: Orthopedics;  Laterality: Right;   BLADDER SUSPENSION  2009   BREAST BIOPSY Left 1990's   benign   BREAST BIOPSY Left 10/28/2015   SMALL FRAGMENTS OF  LYMPHOID TISSUE   CHOLECYSTECTOMY     COLONOSCOPY     DILATION AND CURETTAGE OF UTERUS     GASTRIC RESTRICTION SURGERY  08/23/2015   HIATAL HERNIA REPAIR  2016   JOINT REPLACEMENT Bilateral 2014   Partial Knee Replacement,  Surgeon: Dr. Elfredia Nevins, [facility]   LUMBAR DISC SURGERY  2008   LUMBAR LAMINECTOMY/DECOMPRESSION MICRODISCECTOMY Right 02/24/2023   Procedure: RIGHT L5-S1 MICRODISCECTOMY;  Surgeon: Venetia Night, MD;  Location: [facility] OR[facility];  Service: Neurosurgery;  Laterality: Right;    SHOULDER ARTHROSCOPY WITH DEBRIDEMENT AND BICEP TENDON REPAIR Right 04/02/2016   Procedure: SHOULDER ARTHROSCOPY WITH DEBRIDEMENT ;  Surgeon: Christena Flake, MD;  Location: [facility];  Service: Orthopedics;  Laterality: Right;   SHOULDER ARTHROSCOPY WITH OPEN ROTATOR CUFF REPAIR Right 04/02/2016   Procedure: SHOULDER ARTHROSCOPY WITH OPEN ROTATOR CUFF REPAIR;  Surgeon: Christena Flake, MD;  Location: [facility];  Service: Orthopedics;  Laterality: Right;   SHOULDER ARTHROSCOPY WITH SUBACROMIAL DECOMPRESSION Right 04/02/2016   Procedure: SHOULDER ARTHROSCOPY WITH SUBACROMIAL DECOMPRESSION;  Surgeon: Christena Flake, MD;  Location: [facility];  Service: Orthopedics;  Laterality: Right;   TENDON RELEASE Bilateral 08/2021   thumbs    Medical History: Past Medical History:  Diagnosis Date   Abnormal EKG    Anxiety    DDD (degenerative disc disease), lumbar    Depression    GERD (gastroesophageal reflux disease)    History of sinus surgery    3 times   Hyperlipidemia    Hypothyroidism    Irritable bowel syndrome with constipation    Lumbar radiculitis    Obesity    Palpitations    Sleep apnea    not needed after weight loss surgery   Trigger finger     Family History: Family History  Problem Relation Age of Onset   Hypertension Father    Diabetes Father    Breast cancer Maternal Aunt        great aunts. 70-80   Breast cancer Maternal Grandmother 15       metatastic to breast   Ovarian cancer Maternal Grandmother    Breast cancer Paternal Grandmother        52's   Colon cancer Neg Hx     Social History   Socioeconomic History   Marital status: Married    Spouse name: Rickard   Number of children: 1   Years of education: Not on file   Highest education level: Not on file  Occupational History   Not on file  Tobacco Use   Smoking status: Never   Smokeless tobacco: Never  Vaping Use   Vaping status: Never Used  Substance and Sexual Activity   Alcohol use: No    Alcohol/week:  0.0 standard drinks of alcohol   Drug use: No   Sexual activity: Yes    Birth control/protection: Post-menopausal  Other Topics Concern   Not on file  Social History Narrative   Not on file   Social Determinants of Health   Financial Resource Strain: Not on file  Food Insecurity: Not on file  Transportation Needs: Not on file  Physical Activity: Inactive (12/08/2017)   Exercise Vital Sign    Days of Exercise per Week: 0 days    Minutes of Exercise per Session: 0 min  Stress: Not on file  Social Connections: Not on file  Intimate Partner Violence: Not on file      Review of Systems  HENT:  Positive for congestion, postnasal  drip, rhinorrhea, sneezing and sore throat.   Respiratory: Negative.  Negative for cough.   Cardiovascular: Negative.  Negative for chest pain and palpitations.    Vital Signs: Ht 5\' 5"  (1.651 m)   Wt 225 lb (102.1 kg)   LMP 12/21/2015 (Approximate)   BMI 37.44 kg/m    Observation/Objective: She is alert and oriented and engages in conversation appropriately. No acute distress noted.     Assessment/Plan: 1. Acute non-recurrent frontal sinusitis Doxycycline prescribed.  - doxycycline (VIBRA-TABS) 100 MG tablet; Take 1 tablet (100 mg total) by mouth 2 (two) times daily. Take with food  Dispense: 20 tablet; Refill: 0   General Counseling: Nesreen verbalizes understanding of the findings of today's phone visit and agrees with plan of treatment. I have discussed any further diagnostic evaluation that may be needed or ordered today. We also reviewed her medications today. she has been encouraged to call the office with any questions or concerns that should arise related to todays visit.  Return if symptoms worsen or fail to improve.   No orders of the defined types were placed in this encounter.   Meds ordered this encounter  Medications   doxycycline (VIBRA-TABS) 100 MG tablet    Sig: Take 1 tablet (100 mg total) by mouth 2 (two) times daily.  Take with food    Dispense:  20 tablet    Refill:  0    Time spent:10 Minutes Time spent with patient included reviewing progress notes, labs, imaging studies, and discussing plan for follow up.  [place] Controlled Substance Database was reviewed by me for overdose risk score (ORS) if appropriate.  This patient was seen by Sallyanne Kuster, FNP-C in collaboration with Dr. Beverely Risen as a part of collaborative care agreement.   R. Tedd Sias, MSN, FNP-C Internal medicine

## 2023-05-18 ENCOUNTER — Ambulatory Visit (INDEPENDENT_AMBULATORY_CARE_PROVIDER_SITE_OTHER): Payer: BC Managed Care – PPO | Admitting: Neurosurgery

## 2023-05-18 ENCOUNTER — Encounter: Payer: Self-pay | Admitting: Neurosurgery

## 2023-05-18 VITALS — BP 116/72 | Temp 99.5°F | Ht 65.0 in | Wt 228.0 lb

## 2023-05-18 DIAGNOSIS — Z09 Encounter for follow-up examination after completed treatment for conditions other than malignant neoplasm: Secondary | ICD-10-CM

## 2023-05-18 DIAGNOSIS — M5116 Intervertebral disc disorders with radiculopathy, lumbar region: Secondary | ICD-10-CM

## 2023-05-18 DIAGNOSIS — Z9889 Other specified postprocedural states: Secondary | ICD-10-CM

## 2023-05-18 NOTE — Progress Notes (Signed)
   REFERRING PHYSICIAN:  Sallyanne Kuster, [address] [place],  [place] [address]  DOS: 02/24/23 right L5-S1 microdiscectomy   HISTORY OF PRESENT ILLNESS: Sherry Evans is a 62 y.o presenting today about 2 and half months status post lumbar discectomy.  She is doing very well postoperatively without any significant recurrent symptoms.  She is very pleased with her postoperative recovery thus far.  04/08/23 Dr. Alcide Clever is status post lumbar microdiscectomy.   She is doing extremely well.  PHYSICAL EXAMINATION:  General: Patient is well developed, well nourished, calm, collected, and in no apparent distress.   NEUROLOGICAL:  General: In no acute distress.   Awake, alert, oriented to person, place, and time.  Pupils equal round and reactive to light.    Strength:            Side Iliopsoas Quads Hamstring PF DF EHL  R 5 5 5 5 5 5   L 5 5 5 5 5 5    Incision well healed    ROS (Neurologic):  Negative except as noted above  IMAGING: No interval imaging to review  ASSESSMENT/PLAN:  Sherry Evans is doing well after microdiscectomy.  She has had complete resolution of her symptoms.  We discussed that she can resume activities as tolerated.  We discussed return precautions.  We will see her going forward on an as-needed basis.  She was encouraged to call our office with any questions or concerns.  She expressed understanding and was in agreement with this plan.  I spent a total of 15 minutes in both face-to-face and non-face-to-face activities for this visit on the date of this encounter.   Manning Charity PA-C Department of neurosurgery

## 2023-05-20 ENCOUNTER — Other Ambulatory Visit: Payer: Self-pay | Admitting: Nurse Practitioner

## 2023-05-20 DIAGNOSIS — Z79899 Other long term (current) drug therapy: Secondary | ICD-10-CM

## 2023-05-20 DIAGNOSIS — Z0001 Encounter for general adult medical examination with abnormal findings: Secondary | ICD-10-CM

## 2023-05-21 ENCOUNTER — Ambulatory Visit: Payer: BC Managed Care – PPO | Admitting: Obstetrics and Gynecology

## 2023-05-21 DIAGNOSIS — Z01419 Encounter for gynecological examination (general) (routine) without abnormal findings: Secondary | ICD-10-CM

## 2023-05-21 DIAGNOSIS — Z1231 Encounter for screening mammogram for malignant neoplasm of breast: Secondary | ICD-10-CM

## 2023-05-25 ENCOUNTER — Other Ambulatory Visit: Payer: Self-pay | Admitting: Nurse Practitioner

## 2023-05-25 DIAGNOSIS — J309 Allergic rhinitis, unspecified: Secondary | ICD-10-CM

## 2023-06-04 ENCOUNTER — Encounter: Payer: Self-pay | Admitting: Nurse Practitioner

## 2023-06-04 ENCOUNTER — Ambulatory Visit (INDEPENDENT_AMBULATORY_CARE_PROVIDER_SITE_OTHER): Payer: BC Managed Care – PPO | Admitting: Nurse Practitioner

## 2023-06-04 VITALS — BP 120/80 | HR 78 | Temp 98.3°F | Resp 16 | Ht 65.0 in | Wt 227.6 lb

## 2023-06-04 DIAGNOSIS — G8929 Other chronic pain: Secondary | ICD-10-CM

## 2023-06-04 DIAGNOSIS — M2550 Pain in unspecified joint: Secondary | ICD-10-CM

## 2023-06-04 DIAGNOSIS — E039 Hypothyroidism, unspecified: Secondary | ICD-10-CM

## 2023-06-04 DIAGNOSIS — M792 Neuralgia and neuritis, unspecified: Secondary | ICD-10-CM

## 2023-06-04 DIAGNOSIS — Z79899 Other long term (current) drug therapy: Secondary | ICD-10-CM

## 2023-06-04 DIAGNOSIS — H353130 Nonexudative age-related macular degeneration, bilateral, stage unspecified: Secondary | ICD-10-CM | POA: Diagnosis not present

## 2023-06-04 MED ORDER — GABAPENTIN 600 MG PO TABS
ORAL_TABLET | ORAL | 1 refills | Status: DC
Start: 2023-06-04 — End: 2023-12-10

## 2023-06-04 MED ORDER — NYSTATIN-TRIAMCINOLONE 100000-0.1 UNIT/GM-% EX OINT: TOPICAL_OINTMENT | Freq: Two times a day (BID) | CUTANEOUS | 3 refills | Status: DC | PRN

## 2023-06-04 MED ORDER — LEVOTHYROXINE SODIUM 100 MCG PO TABS: 100.0000 ug | ORAL_TABLET | Freq: Every day | ORAL | 1 refills | Status: DC

## 2023-06-04 MED ORDER — MELOXICAM 15 MG PO TABS
15.0000 mg | ORAL_TABLET | Freq: Every day | ORAL | 1 refills | Status: DC
Start: 2023-06-04 — End: 2023-12-10

## 2023-06-04 MED ORDER — LINACLOTIDE 145 MCG PO CAPS
ORAL_CAPSULE | ORAL | 1 refills | Status: DC
Start: 2023-06-04 — End: 2023-12-10

## 2023-06-04 NOTE — Progress Notes (Signed)
[facility] [address] [place], [place] [address]  Internal MEDICINE  Office Visit Note  Patient Name: Sherry Evans  [id number]  [id number]  Date of Service: 06/04/2023  Chief Complaint  Patient presents with   Depression   Gastroesophageal Reflux   Hyperlipidemia   Follow-up    HPI Amirra presents for a follow-up visit for insomnia, AMD, hypothyroidism, arthritis.  Insomnia -- taking trazodone 50-100 mg at bedtime, stable. Diagnosed with AMD -- on vitamins for eyes prescribed by her ophthalmologist and discussed how to use an Amsler grid to check the status of her vision.  Due for multiple refills.      Current Medication: Outpatient Encounter Medications as of 06/04/2023  Medication Sig   budesonide (PULMICORT) 0.5 MG/2ML nebulizer solution Take 0.5 mg by nebulization 2 (two) times daily as needed. Nasal rinse (not used as nebulizer)   docusate sodium (COLACE) 100 MG capsule Take 100 mg by mouth daily as needed for mild constipation.   levocetirizine (XYZAL) 5 MG tablet TAKE 1 TABLET (5 MG TOTAL) BY MOUTH DAILY.   Magnesium 400 MG CAPS Take 2 capsules by mouth daily.   montelukast (SINGULAIR) 10 MG tablet TAKE 1 TABLET BY MOUTH DAILY FOR ALLERGIES   MULTIPLE VITAMIN PO Take 2 capsules by mouth daily.   traZODone (DESYREL) 50 MG tablet TAKE 1 TABLET (50 MG TOTAL) BY MOUTH AT BEDTIME AS NEEDED. (Patient taking differently: Take 2 tablets at bedtime)   TURMERIC-GINGER PO Take 2 tablets by mouth daily.   VITAMIN D-VITAMIN K PO Take 1 capsule by mouth daily.   vitamin E 1000 UNIT capsule Take 1,000 Units by mouth daily.   [DISCONTINUED] gabapentin (NEURONTIN) 600 MG tablet TAKE 3 TABLETS BY MOUTH AT EVENING MEAL (Patient taking differently: TAKE 2 TABLETS BY MOUTH AT EVENING MEAL)   [DISCONTINUED] ketoconazole (NIZORAL) 2 % cream Apply 1 application topically daily.   [DISCONTINUED] levothyroxine (SYNTHROID) 100 MCG tablet Take 1 tablet (100 mcg total) by mouth  daily.   [DISCONTINUED] linaclotide (LINZESS) 145 MCG CAPS capsule TAKE 1 CAPSULE BY MOUTH DAILY BEFORE BREAKFAST.   [DISCONTINUED] meloxicam (MOBIC) 15 MG tablet Take 1 tablet (15 mg total) by mouth daily.   [DISCONTINUED] nystatin-triamcinolone ointment (MYCOLOG) Apply topically 2 (two) times daily as needed.   [DISCONTINUED] zinc gluconate 50 MG tablet Take 50 mg by mouth daily.   gabapentin (NEURONTIN) 600 MG tablet TAKE 3 TABLETS BY MOUTH AT EVENING MEAL   levothyroxine (SYNTHROID) 100 MCG tablet Take 1 tablet (100 mcg total) by mouth daily.   linaclotide (LINZESS) 145 MCG CAPS capsule TAKE 1 CAPSULE BY MOUTH DAILY BEFORE BREAKFAST.   meloxicam (MOBIC) 15 MG tablet Take 1 tablet (15 mg total) by mouth daily.   nystatin-triamcinolone ointment (MYCOLOG) Apply topically 2 (two) times daily as needed.   No facility-administered encounter medications on file as of 06/04/2023.    Surgical History: Past Surgical History:  Procedure Laterality Date   ADENOIDECTOMY  1969   BICEPT TENODESIS Right 04/02/2016   Procedure: BICEPS TENODESIS;  Surgeon: Christena Flake, MD;  Location: [facility];  Service: Orthopedics;  Laterality: Right;   BLADDER SUSPENSION  2009   BREAST BIOPSY Left 1990's   benign   BREAST BIOPSY Left 10/28/2015   SMALL FRAGMENTS OF LYMPHOID TISSUE   CHOLECYSTECTOMY     COLONOSCOPY     DILATION AND CURETTAGE OF UTERUS     GASTRIC RESTRICTION SURGERY  08/23/2015   HIATAL HERNIA REPAIR  2016   JOINT  REPLACEMENT Bilateral 2014   Partial Knee Replacement,  Surgeon: Dr. Elfredia Nevins, [facility]   LUMBAR DISC SURGERY  2008   LUMBAR LAMINECTOMY/DECOMPRESSION MICRODISCECTOMY Right 02/24/2023   Procedure: RIGHT L5-S1 MICRODISCECTOMY;  Surgeon: Venetia Night, MD;  Location: [facility];  Service: Neurosurgery;  Laterality: Right;   SHOULDER ARTHROSCOPY WITH DEBRIDEMENT AND BICEP TENDON REPAIR Right 04/02/2016   Procedure: SHOULDER ARTHROSCOPY WITH DEBRIDEMENT ;  Surgeon: Christena Flake, MD;  Location: [facility];  Service: Orthopedics;  Laterality: Right;   SHOULDER ARTHROSCOPY WITH OPEN ROTATOR CUFF REPAIR Right 04/02/2016   Procedure: SHOULDER ARTHROSCOPY WITH OPEN ROTATOR CUFF REPAIR;  Surgeon: Christena Flake, MD;  Location: [facility];  Service: Orthopedics;  Laterality: Right;   SHOULDER ARTHROSCOPY WITH SUBACROMIAL DECOMPRESSION Right 04/02/2016   Procedure: SHOULDER ARTHROSCOPY WITH SUBACROMIAL DECOMPRESSION;  Surgeon: Christena Flake, MD;  Location: [facility];  Service: Orthopedics;  Laterality: Right;   TENDON RELEASE Bilateral 08/2021   thumbs    Medical History: Past Medical History:  Diagnosis Date   Abnormal EKG    Anxiety    DDD (degenerative disc disease), lumbar    Depression    GERD (gastroesophageal reflux disease)    History of sinus surgery    3 times   Hyperlipidemia    Hypothyroidism    Irritable bowel syndrome with constipation    Lumbar radiculitis    Obesity    Palpitations    Sleep apnea    not needed after weight loss surgery   Trigger finger     Family History: Family History  Problem Relation Age of Onset   Hypertension Father    Diabetes Father    Breast cancer Maternal Aunt        great aunts. 70-80   Breast cancer Maternal Grandmother 61       metatastic to breast   Ovarian cancer Maternal Grandmother    Breast cancer Paternal Grandmother        63's   Colon cancer Neg Hx     Social History   Socioeconomic History   Marital status: Married    Spouse name: Rickard   Number of children: 1   Years of education: Not on file   Highest education level: Not on file  Occupational History   Not on file  Tobacco Use   Smoking status: Never   Smokeless tobacco: Never  Vaping Use   Vaping status: Never Used  Substance and Sexual Activity   Alcohol use: No    Alcohol/week: 0.0 standard drinks of alcohol   Drug use: No   Sexual activity: Yes    Birth control/protection: Post-menopausal  Other Topics Concern   Not on  file  Social History Narrative   Not on file   Social Determinants of Health   Financial Resource Strain: Not on file  Food Insecurity: Not on file  Transportation Needs: Not on file  Physical Activity: Inactive (12/08/2017)   Exercise Vital Sign    Days of Exercise per Week: 0 days    Minutes of Exercise per Session: 0 min  Stress: Not on file  Social Connections: Not on file  Intimate Partner Violence: Not on file      Review of Systems  Constitutional:  Negative for chills, fatigue and unexpected weight change.  HENT:  Negative for congestion, rhinorrhea, sneezing and sore throat.   Eyes:  Negative for redness.  Respiratory: Negative.  Negative for cough, chest tightness, shortness of breath and wheezing.  Cardiovascular: Negative.  Negative for chest pain and palpitations.  Gastrointestinal:  Negative for abdominal pain, constipation, diarrhea, nausea and vomiting.  Genitourinary:  Negative for dysuria and frequency.  Musculoskeletal:  Positive for arthralgias, back pain, joint swelling and myalgias. Negative for neck pain.  Neurological: Negative.  Negative for tremors and numbness.  Hematological:  Negative for adenopathy. Does not bruise/bleed easily.  Psychiatric/Behavioral:  Negative for behavioral problems (Depression), sleep disturbance and suicidal ideas. The patient is not nervous/anxious.     Vital Signs: BP 120/80   Pulse 78   Temp 98.3 F (36.8 C)   Resp 16   Ht 5\' 5"  (1.651 m)   Wt 227 lb 9.6 oz (103.2 kg)   LMP 12/21/2015 (Approximate)   SpO2 97%   BMI 37.87 kg/m    Physical Exam Vitals reviewed.  Constitutional:      General: She is not in acute distress.    Appearance: Normal appearance. She is obese. She is not ill-appearing.  HENT:     Head: Normocephalic and atraumatic.  Eyes:     Pupils: Pupils are equal, round, and reactive to light.  Cardiovascular:     Rate and Rhythm: Normal rate and regular rhythm.  Pulmonary:     Effort:  Pulmonary effort is normal. No respiratory distress.  Neurological:     Mental Status: She is alert and oriented to person, place, and time.  Psychiatric:        Mood and Affect: Mood normal.        Behavior: Behavior normal.        Assessment/Plan: 1. Acquired hypothyroidism Continue levothyroxine as prescribed.  - levothyroxine (SYNTHROID) 100 MCG tablet; Take 1 tablet (100 mcg total) by mouth daily.  Dispense: 90 tablet; Refill: 1  2. Chronic peripheral neuropathic pain Continue gabapentin as prescribed.  - gabapentin (NEURONTIN) 600 MG tablet; TAKE 3 TABLETS BY MOUTH AT EVENING MEAL  Dispense: 270 tablet; Refill: 1  3. Arthralgia of multiple joints Continue meloxicam as prescribed  - meloxicam (MOBIC) 15 MG tablet; Take 1 tablet (15 mg total) by mouth daily.  Dispense: 90 tablet; Refill: 1  4. Bilateral nonexudative age-related macular degeneration, unspecified stage Managed by ophthalmologist. Started on vitamins to help slow progression.   5. Encounter for medication review Medication list reviewed, updated and refills ordered  - linaclotide (LINZESS) 145 MCG CAPS capsule; TAKE 1 CAPSULE BY MOUTH DAILY BEFORE BREAKFAST.  Dispense: 90 capsule; Refill: 1 - nystatin-triamcinolone ointment (MYCOLOG); Apply topically 2 (two) times daily as needed.  Dispense: 30 g; Refill: 3 - levothyroxine (SYNTHROID) 100 MCG tablet; Take 1 tablet (100 mcg total) by mouth daily.  Dispense: 90 tablet; Refill: 1   General Counseling: Temperence verbalizes understanding of the findings of todays visit and agrees with plan of treatment. I have discussed any further diagnostic evaluation that may be needed or ordered today. We also reviewed her medications today. she has been encouraged to call the office with any questions or concerns that should arise related to todays visit.    No orders of the defined types were placed in this encounter.   Meds ordered this encounter  Medications   linaclotide  (LINZESS) 145 MCG CAPS capsule    Sig: TAKE 1 CAPSULE BY MOUTH DAILY BEFORE BREAKFAST.    Dispense:  90 capsule    Refill:  1    For future refills, do not send now, she just got some.   gabapentin (NEURONTIN) 600 MG tablet    Sig: TAKE  3 TABLETS BY MOUTH AT EVENING MEAL    Dispense:  270 tablet    Refill:  1   nystatin-triamcinolone ointment (MYCOLOG)    Sig: Apply topically 2 (two) times daily as needed.    Dispense:  30 g    Refill:  3   levothyroxine (SYNTHROID) 100 MCG tablet    Sig: Take 1 tablet (100 mcg total) by mouth daily.    Dispense:  90 tablet    Refill:  1    For future refills   meloxicam (MOBIC) 15 MG tablet    Sig: Take 1 tablet (15 mg total) by mouth daily.    Dispense:  90 tablet    Refill:  1    Return for previously scheduled, CPE, Latria Mccarron PCP in march 2025.   Total time spent:30 Minutes Time spent includes review of chart, medications, test results, and follow up plan with the patient.   [place] Controlled Substance Database was reviewed by me.  This patient was seen by Sallyanne Kuster, FNP-C in collaboration with Dr. Beverely Risen as a part of collaborative care agreement.   Arcola Freshour R. Tedd Sias, MSN, FNP-C Internal medicine

## 2023-06-21 ENCOUNTER — Other Ambulatory Visit: Payer: Self-pay | Admitting: Internal Medicine

## 2023-06-21 ENCOUNTER — Encounter: Payer: Self-pay | Admitting: Nurse Practitioner

## 2023-06-21 ENCOUNTER — Other Ambulatory Visit: Payer: Self-pay | Admitting: Nurse Practitioner

## 2023-06-21 DIAGNOSIS — F321 Major depressive disorder, single episode, moderate: Secondary | ICD-10-CM

## 2023-06-21 MED ORDER — SERTRALINE HCL 50 MG PO TABS: ORAL_TABLET | ORAL | 3 refills | Status: DC

## 2023-06-21 NOTE — Telephone Encounter (Signed)
Please review send

## 2023-06-21 NOTE — Telephone Encounter (Signed)
I sent zoloft for her

## 2023-07-16 ENCOUNTER — Other Ambulatory Visit: Payer: Self-pay | Admitting: Internal Medicine

## 2023-08-16 ENCOUNTER — Other Ambulatory Visit: Payer: Self-pay | Admitting: Internal Medicine

## 2023-08-21 ENCOUNTER — Other Ambulatory Visit: Payer: Self-pay | Admitting: Neurosurgery

## 2023-08-21 ENCOUNTER — Other Ambulatory Visit: Payer: Self-pay | Admitting: Nurse Practitioner

## 2023-08-21 DIAGNOSIS — Z760 Encounter for issue of repeat prescription: Secondary | ICD-10-CM

## 2023-09-21 ENCOUNTER — Ambulatory Visit (INDEPENDENT_AMBULATORY_CARE_PROVIDER_SITE_OTHER): Payer: BC Managed Care – PPO | Admitting: Obstetrics and Gynecology

## 2023-09-21 ENCOUNTER — Encounter: Payer: Self-pay | Admitting: Obstetrics and Gynecology

## 2023-09-21 VITALS — BP 120/78 | HR 77 | Ht 65.0 in | Wt 228.8 lb

## 2023-09-21 DIAGNOSIS — Z01419 Encounter for gynecological examination (general) (routine) without abnormal findings: Secondary | ICD-10-CM

## 2023-09-21 DIAGNOSIS — Z1231 Encounter for screening mammogram for malignant neoplasm of breast: Secondary | ICD-10-CM

## 2023-09-21 NOTE — Progress Notes (Signed)
 Patients presents for annual exam today. She states doing well. Postmenopusal with no concerns. Up to date on pap smear. Due for mammogram, ordered. Annual labs and colonoscopy are deferred to PCP. She states no other questions or concerns at this time.

## 2023-09-21 NOTE — Progress Notes (Signed)
 HPI:      [name] is a 62 y.o. [id number] who LMP was Patient's last menstrual period was 12/21/2015 (approximate).  Subjective:   She presents today for her annual examination.  She has no complaints.  She is up-to-date on her Pap smear. She is due for colonoscopy and we have discussed this.  She plans to contact her primary care physician and get a referral.    Hx: The following portions of the patient's history were reviewed and updated as appropriate:             She  has a past medical history of Abnormal EKG, Anxiety, DDD (degenerative disc disease), lumbar, Depression, GERD (gastroesophageal reflux disease), History of sinus surgery, Hyperlipidemia, Hypothyroidism, Irritable bowel syndrome with constipation, Lumbar radiculitis, Obesity, Palpitations, Sleep apnea, and Trigger finger. She does not have any pertinent problems on file. She  has a past surgical history that includes Bladder suspension (2009); Lumbar disc surgery (2008); Cholecystectomy; Gastric restriction surgery (08/23/2015); Hiatal hernia repair (2016); Adenoidectomy (1969); Colonoscopy; Dilation and curettage of uterus; Joint replacement (Bilateral, 2014); Shoulder arthroscopy with open rotator cuff repair (Right, 04/02/2016); Bicept tenodesis (Right, 04/02/2016); Shoulder arthroscopy with subacromial decompression (Right, 04/02/2016); Shoulder arthroscopy with debridement and bicep tendon repair (Right, 04/02/2016); Breast biopsy (Left, 1990's); Breast biopsy (Left, 10/28/2015); Tendon release (Bilateral, 08/2021); and Lumbar laminectomy/decompression microdiscectomy (Right, 02/24/2023). Her family history includes Breast cancer in her maternal aunt and paternal grandmother; Breast cancer (age of onset: 49) in her maternal grandmother; Diabetes in her father; Hypertension in her father; Ovarian cancer in her maternal grandmother. She  reports that she has never smoked. She has never used smokeless tobacco. She reports  that she does not drink alcohol and does not use drugs. She has a current medication list which includes the following prescription(s): budesonide, docusate sodium , gabapentin , levocetirizine, levothyroxine , linaclotide , magnesium, meloxicam , montelukast , multiple vitamin, nystatin -triamcinolone  ointment, omeprazole , sertraline , trazodone , turmeric-ginger, vitamin d -vitamin k, and vitamin e. She is allergic to azithromycin and sulfa antibiotics.       Review of Systems:  Review of Systems  Constitutional: Denied constitutional symptoms, night sweats, recent illness, fatigue, fever, insomnia and weight loss.  Eyes: Denied eye symptoms, eye pain, photophobia, vision change and visual disturbance.  Ears/Nose/Throat/Neck: Denied ear, nose, throat or neck symptoms, hearing loss, nasal discharge, sinus congestion and sore throat.  Cardiovascular: Denied cardiovascular symptoms, arrhythmia, chest pain/pressure, edema, exercise intolerance, orthopnea and palpitations.  Respiratory: Denied pulmonary symptoms, asthma, pleuritic pain, productive sputum, cough, dyspnea and wheezing.  Gastrointestinal: Denied, gastro-esophageal reflux, melena, nausea and vomiting.  Genitourinary: Denied genitourinary symptoms including symptomatic vaginal discharge, pelvic relaxation issues, and urinary complaints.  Musculoskeletal: Denied musculoskeletal symptoms, stiffness, swelling, muscle weakness and myalgia.  Dermatologic: Denied dermatology symptoms, rash and scar.  Neurologic: Denied neurology symptoms, dizziness, headache, neck pain and syncope.  Psychiatric: Denied psychiatric symptoms, anxiety and depression.  Endocrine: Denied endocrine symptoms including hot flashes and night sweats.   Meds:   Current Outpatient Medications on File Prior to Visit  Medication Sig Dispense Refill   budesonide (PULMICORT) 0.5 MG/2ML nebulizer solution Take 0.5 mg by nebulization 2 (two) times daily as needed. Nasal rinse (not  used as nebulizer)     docusate sodium  (COLACE) 100 MG capsule Take 100 mg by mouth daily as needed for mild constipation.     gabapentin  (NEURONTIN ) 600 MG tablet TAKE 3 TABLETS BY MOUTH AT EVENING MEAL 270 tablet 1   levocetirizine (XYZAL ) 5 MG tablet TAKE 1 TABLET (5  MG TOTAL) BY MOUTH DAILY. 90 tablet 1   levothyroxine  (SYNTHROID ) 100 MCG tablet Take 1 tablet (100 mcg total) by mouth daily. 90 tablet 1   linaclotide  (LINZESS ) 145 MCG CAPS capsule TAKE 1 CAPSULE BY MOUTH DAILY BEFORE BREAKFAST. 90 capsule 1   Magnesium 400 MG CAPS Take 2 capsules by mouth daily.     meloxicam  (MOBIC ) 15 MG tablet Take 1 tablet (15 mg total) by mouth daily. 90 tablet 1   montelukast  (SINGULAIR ) 10 MG tablet TAKE 1 TABLET BY MOUTH DAILY FOR ALLERGIES 90 tablet 1   MULTIPLE VITAMIN PO Take 2 capsules by mouth daily.     nystatin -triamcinolone  ointment (MYCOLOG) Apply topically 2 (two) times daily as needed. 30 g 3   omeprazole  (PRILOSEC) 40 MG capsule TAKE 1 CAPSULE (40 MG TOTAL) BY MOUTH IN THE MORNING AND AT BEDTIME. MAY START WITH ONCE DAILY AND WORK UP TO TWICE DAILY. 180 capsule 1   sertraline  (ZOLOFT ) 50 MG tablet TAKE HALF TAB A DAY FOR ONE WEEK AND THEN INCREASE TO ONE TAB A DAY 90 tablet 2   traZODone  (DESYREL ) 50 MG tablet TAKE 1 TABLET (50 MG TOTAL) BY MOUTH AT BEDTIME AS NEEDED. (Patient taking differently: Take 2 tablets at bedtime) 90 tablet 1   TURMERIC-GINGER PO Take 2 tablets by mouth daily.     VITAMIN D -VITAMIN K PO Take 1 capsule by mouth daily.     vitamin E 1000 UNIT capsule Take 1,000 Units by mouth daily.     No current facility-administered medications on file prior to visit.     Objective:     Vitals:   09/21/23 1520  BP: 120/78  Pulse: 77    Filed Weights   09/21/23 1520  Weight: 228 lb 12.8 oz (103.8 kg)              Physical examination General NAD, Conversant  HEENT Atraumatic; Op clear with mmm.  Normo-cephalic.  Anicteric sclerae  Thyroid /Neck Smooth without  nodularity or enlargement. Normal ROM.  Neck Supple.  Skin No rashes, lesions or ulceration. Normal palpated skin turgor. No nodularity.  Breasts: No masses or discharge.  Symmetric.  No axillary adenopathy.  Lungs: Clear to auscultation.No rales or wheezes. Normal Respiratory effort, no retractions.  Heart: NSR.  No murmurs or rubs appreciated. No peripheral edema  Abdomen: Soft.  Non-tender.  No masses.  No HSM. No hernia  Extremities: Moves all appropriately.  Normal ROM for age. No lymphadenopathy.  Neuro: Oriented to PPT.  Normal mood. Normal affect.     Pelvic: Deferred by patient    Assessment:    [id number] Patient Active Problem List   Diagnosis Date Noted   Recurrent herniation of lumbar disc 02/24/2023   Hyperlipidemia, mixed 04/12/2020   Palpitations 03/13/2020   Urinary tract infection with hematuria 02/28/2020   Dysuria 02/28/2020   Cardiac arrhythmia 02/28/2020   Gastroesophageal reflux disease 10/18/2019   Status post bariatric surgery 09/20/2019   Abnormal weight gain 08/23/2019   Encounter for general adult medical examination with abnormal findings 08/20/2018   Irritable bowel syndrome with constipation 08/20/2018   Depression, major, single episode, moderate (HCC) 08/20/2018   Chickenpox 08/16/2018   Environmental allergies 08/16/2018   Menstrual abnormality 08/16/2018   Pain in left hip 02/23/2018   Fatigue 02/23/2018   Raynaud's phenomenon without gangrene 02/23/2018   Hypothyroidism 10/29/2016   Vaginal atrophy 10/29/2016   Dyspareunia in female 10/29/2016   Obesity (BMI 30.0-34.9) 10/29/2016   Menopause 10/29/2016  Tendinitis of right rotator cuff 03/02/2016   Injury of tendon of long head of right biceps 03/02/2016   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 Well woman exam with routine gynecological exam   2. Screening mammogram for breast cancer        Plan:            1.  Basic Screening Recommendations The basic screening recommendations for asymptomatic women were discussed with the patient during her visit.  The age-appropriate recommendations were discussed with her and the rational for the tests reviewed.  When I am informed by the patient that another primary care physician has previously obtained the age-appropriate tests and they are up-to-date, only outstanding tests are ordered and referrals given as necessary.  Abnormal results of tests will be discussed with her when all of her results are completed.  Routine preventative health maintenance measures emphasized: Exercise/Diet/Weight control, Tobacco Warnings, Alcohol/Substance use risks and Stress Management Mammogram scheduled   Orders Orders Placed This Encounter  Procedures   MM DIGITAL SCREENING BILATERAL    No orders of the defined types were placed in this encounter.         F/U  Return in about 1 year (around 09/20/2024) for Annual Physical.  Alm DOROTHA Sar, M.D. 09/21/2023 3:52 PM

## 2023-10-14 ENCOUNTER — Telehealth: Payer: 59 | Admitting: Physician Assistant

## 2023-10-14 DIAGNOSIS — J019 Acute sinusitis, unspecified: Secondary | ICD-10-CM | POA: Diagnosis not present

## 2023-10-14 DIAGNOSIS — B9689 Other specified bacterial agents as the cause of diseases classified elsewhere: Secondary | ICD-10-CM

## 2023-10-14 MED ORDER — DOXYCYCLINE HYCLATE 100 MG PO TABS
100.0000 mg | ORAL_TABLET | Freq: Two times a day (BID) | ORAL | 0 refills | Status: DC
Start: 2023-10-14 — End: 2023-12-10

## 2023-10-14 NOTE — Progress Notes (Signed)
I have spent 5 minutes in review of e-visit questionnaire, review and updating patient chart, medical decision making and response to patient.   Mia Milan Cody Jacklynn Dehaas, PA-C    

## 2023-10-14 NOTE — Progress Notes (Signed)

## 2023-10-19 ENCOUNTER — Ambulatory Visit
Admission: RE | Admit: 2023-10-19 | Discharge: 2023-10-19 | Disposition: A | Discharge status: Home / Self Care | Payer: 59 | Source: Ambulatory Visit | Attending: Obstetrics and Gynecology | Admitting: Obstetrics and Gynecology

## 2023-10-19 DIAGNOSIS — Z01419 Encounter for gynecological examination (general) (routine) without abnormal findings: Secondary | ICD-10-CM | POA: Diagnosis present

## 2023-10-19 DIAGNOSIS — Z1231 Encounter for screening mammogram for malignant neoplasm of breast: Secondary | ICD-10-CM | POA: Diagnosis present

## 2023-11-07 ENCOUNTER — Other Ambulatory Visit: Payer: Self-pay | Admitting: Nurse Practitioner

## 2023-11-07 DIAGNOSIS — Z0001 Encounter for general adult medical examination with abnormal findings: Secondary | ICD-10-CM

## 2023-11-07 DIAGNOSIS — J309 Allergic rhinitis, unspecified: Secondary | ICD-10-CM

## 2023-11-07 DIAGNOSIS — Z79899 Other long term (current) drug therapy: Secondary | ICD-10-CM

## 2023-11-10 ENCOUNTER — Encounter: Payer: Self-pay | Admitting: Nurse Practitioner

## 2023-11-10 ENCOUNTER — Ambulatory Visit (INDEPENDENT_AMBULATORY_CARE_PROVIDER_SITE_OTHER): Payer: 59 | Admitting: Nurse Practitioner

## 2023-11-10 VITALS — BP 122/78 | HR 93 | Temp 98.0°F | Resp 16 | Ht 65.0 in | Wt 231.0 lb

## 2023-11-10 DIAGNOSIS — E039 Hypothyroidism, unspecified: Secondary | ICD-10-CM | POA: Diagnosis not present

## 2023-11-10 DIAGNOSIS — E538 Deficiency of other specified B group vitamins: Secondary | ICD-10-CM

## 2023-11-10 DIAGNOSIS — Z1211 Encounter for screening for malignant neoplasm of colon: Secondary | ICD-10-CM

## 2023-11-10 DIAGNOSIS — E782 Mixed hyperlipidemia: Secondary | ICD-10-CM | POA: Diagnosis not present

## 2023-11-10 DIAGNOSIS — K648 Other hemorrhoids: Secondary | ICD-10-CM | POA: Diagnosis not present

## 2023-11-10 DIAGNOSIS — E559 Vitamin D deficiency, unspecified: Secondary | ICD-10-CM

## 2023-11-10 DIAGNOSIS — Z1212 Encounter for screening for malignant neoplasm of rectum: Secondary | ICD-10-CM

## 2023-11-10 MED ORDER — HYDROCORTISONE ACETATE 25 MG RE SUPP: 25.0000 mg | Freq: Two times a day (BID) | RECTAL | 3 refills | Status: DC | PRN

## 2023-11-10 NOTE — Progress Notes (Signed)
[facility] [address] [place], [place] [address]  Internal MEDICINE  Office Visit Note  Patient Name: KALENE CUTLER  [id number]  [id number]  Date of Service: 11/10/2023  Chief Complaint  Patient presents with   Acute Visit    Poss rectal prolapse-- rectal pain     HPI Deshundra presents for an acute sick visit for possible hemorrhoids or rectal prolapse?  --onset -- noticed problem a couple of weeks ago.  --had random rectal pain and noticed a purplish-black "thing" on the sides of the anus which have now improved some per patient. Denies any more episodes of pain or increase in pain. Denies rectal pain when have a BM. Reports using stool softeners and sennokot-s OTC medications to help prevent her from having to strain.  --Due for CRC screening -- no family history of colon cancer, wants to try cologuard test --Due for routine labs for upcoming annual physical in march.       Current Medication:  Outpatient Encounter Medications as of 11/10/2023  Medication Sig   budesonide (PULMICORT) 0.5 MG/2ML nebulizer solution Take 0.5 mg by nebulization 2 (two) times daily as needed. Nasal rinse (not used as nebulizer)   docusate sodium (COLACE) 100 MG capsule Take 100 mg by mouth daily as needed for mild constipation.   doxycycline (VIBRA-TABS) 100 MG tablet Take 1 tablet (100 mg total) by mouth 2 (two) times daily.   gabapentin (NEURONTIN) 600 MG tablet TAKE 3 TABLETS BY MOUTH AT EVENING MEAL   hydrocortisone (ANUSOL-HC) 25 MG suppository Place 1 suppository (25 mg total) rectally 2 (two) times daily as needed for hemorrhoids or anal itching.   levocetirizine (XYZAL) 5 MG tablet TAKE 1 TABLET (5 MG TOTAL) BY MOUTH DAILY.   levothyroxine (SYNTHROID) 100 MCG tablet Take 1 tablet (100 mcg total) by mouth daily.   linaclotide (LINZESS) 145 MCG CAPS capsule TAKE 1 CAPSULE BY MOUTH DAILY BEFORE BREAKFAST.   Magnesium 400 MG CAPS Take 2 capsules by mouth daily.   meloxicam  (MOBIC) 15 MG tablet Take 1 tablet (15 mg total) by mouth daily.   montelukast (SINGULAIR) 10 MG tablet TAKE 1 TABLET BY MOUTH DAILY FOR ALLERGIES   MULTIPLE VITAMIN PO Take 2 capsules by mouth daily.   nystatin-triamcinolone ointment (MYCOLOG) Apply topically 2 (two) times daily as needed.   omeprazole (PRILOSEC) 40 MG capsule TAKE 1 CAPSULE (40 MG TOTAL) BY MOUTH IN THE MORNING AND AT BEDTIME. MAY START WITH ONCE DAILY AND WORK UP TO TWICE DAILY.   sertraline (ZOLOFT) 50 MG tablet TAKE HALF TAB A DAY FOR ONE WEEK AND THEN INCREASE TO ONE TAB A DAY   traZODone (DESYREL) 50 MG tablet TAKE 1 TABLET (50 MG TOTAL) BY MOUTH AT BEDTIME AS NEEDED. (Patient taking differently: Take 2 tablets at bedtime)   TURMERIC-GINGER PO Take 2 tablets by mouth daily.   VITAMIN D-VITAMIN K PO Take 1 capsule by mouth daily.   vitamin E 1000 UNIT capsule Take 1,000 Units by mouth daily.   No facility-administered encounter medications on file as of 11/10/2023.      Medical History: Past Medical History:  Diagnosis Date   Abnormal EKG    Anxiety    DDD (degenerative disc disease), lumbar    Depression    GERD (gastroesophageal reflux disease)    History of sinus surgery    3 times   Hyperlipidemia    Hypothyroidism    Irritable bowel syndrome with constipation    Lumbar radiculitis  Obesity    Palpitations    Sleep apnea    not needed after weight loss surgery   Trigger finger      Vital Signs: BP 122/78   Pulse 93   Temp 98 F (36.7 C)   Resp 16   Ht 5\' 5"  (1.651 m)   Wt 231 lb (104.8 kg)   LMP 12/21/2015 (Approximate)   SpO2 98%   BMI 38.44 kg/m    Review of Systems  Constitutional:  Negative for fatigue.  HENT: Negative.    Respiratory: Negative.  Negative for cough, chest tightness, shortness of breath and wheezing.   Cardiovascular: Negative.  Negative for chest pain and palpitations.  Gastrointestinal:  Positive for constipation (takes stool softeners and sennokot) and rectal  pain. Negative for anal bleeding, diarrhea, nausea and vomiting.  Genitourinary: Negative.   Musculoskeletal: Negative.     Physical Exam Vitals reviewed.  Constitutional:      General: She is not in acute distress.    Appearance: Normal appearance. She is obese. She is not ill-appearing.  HENT:     Head: Normocephalic and atraumatic.  Eyes:     Pupils: Pupils are equal, round, and reactive to light.  Cardiovascular:     Rate and Rhythm: Normal rate.  Pulmonary:     Effort: Pulmonary effort is normal. No respiratory distress.  Genitourinary:    Rectum: Guaiac result negative. Internal hemorrhoid present. No mass, tenderness, anal fissure or external hemorrhoid. Abnormal anal tone.     Comments: Anal skin tag x1 small Neurological:     Mental Status: She is alert and oriented to person, place, and time.  Psychiatric:        Mood and Affect: Mood normal.        Behavior: Behavior normal.       Assessment/Plan: 1. Hemorrhoids, internal (Primary) Cortisone suppository prescribed to help minimize size and symptoms of hemorrhoids. Patient encouraged to call clinic if she starts having more rectal pain or has rectal bleeding or blood in stool.  - hydrocortisone (ANUSOL-HC) 25 MG suppository; Place 1 suppository (25 mg total) rectally 2 (two) times daily as needed for hemorrhoids or anal itching.  Dispense: 12 suppository; Refill: 3  2. Acquired hypothyroidism Routine labs ordered  - CBC with Differential/Platelet - CMP14+EGFR - Lipid Profile - TSH + free T4  3. Mixed hyperlipidemia Routine labs ordered  - CBC with Differential/Platelet - CMP14+EGFR - Lipid Profile - TSH + free T4  4. B12 deficiency Routine lab ordered  - B12 and Folate Panel  5. Vitamin D deficiency Routine lab ordered  - Vitamin D (25 hydroxy)  6. Screening for colorectal cancer Cologuard test ordered  - Cologuard   General Counseling: Floraine verbalizes understanding of the findings of todays  visit and agrees with plan of treatment. I have discussed any further diagnostic evaluation that may be needed or ordered today. We also reviewed her medications today. she has been encouraged to call the office with any questions or concerns that should arise related to todays visit.    Counseling:    Orders Placed This Encounter  Procedures   Cologuard   CBC with Differential/Platelet   CMP14+EGFR   Lipid Profile   TSH + free T4   B12 and Folate Panel   Vitamin D (25 hydroxy)    Meds ordered this encounter  Medications   hydrocortisone (ANUSOL-HC) 25 MG suppository    Sig: Place 1 suppository (25 mg total) rectally 2 (two) times daily as  needed for hemorrhoids or anal itching.    Dispense:  12 suppository    Refill:  3    Fill new script today    Return if symptoms worsen or fail to improve.   Controlled Substance Database was reviewed by me for overdose risk score (ORS)  Time spent:30 Minutes Time spent with patient included reviewing progress notes, labs, imaging studies, and discussing plan for follow up.   This patient was seen by Sallyanne Kuster, FNP-C in collaboration with Dr. Beverely Risen as a part of collaborative care agreement.  Kaidance Pantoja R. Tedd Sias, MSN, FNP-C Internal Medicine

## 2023-11-20 LAB — CMP14+EGFR
ALT: 20 IU/L (ref 0–32)
AST: 28 IU/L (ref 0–40)
Albumin: 4.2 g/dL (ref 3.8–4.9)
Alkaline Phosphatase: 86 IU/L (ref 44–121)
BUN/Creatinine Ratio: 24 (ref 12–28)
BUN: 22 mg/dL (ref 8–27)
Bilirubin Total: 0.4 mg/dL (ref 0.0–1.2)
CO2: 23 mmol/L (ref 20–29)
Calcium: 9.4 mg/dL (ref 8.7–10.3)
Chloride: 106 mmol/L (ref 96–106)
Creatinine, Ser: 0.9 mg/dL (ref 0.57–1.00)
Globulin, Total: 2.6 g/dL (ref 1.5–4.5)
Glucose: 94 mg/dL (ref 70–99)
Potassium: 4.4 mmol/L (ref 3.5–5.2)
Sodium: 142 mmol/L (ref 134–144)
Total Protein: 6.8 g/dL (ref 6.0–8.5)
eGFR: 73 mL/min/{1.73_m2} (ref >59)

## 2023-11-20 LAB — LIPID PANEL
Chol/HDL Ratio: 3.4 ratio (ref 0.0–4.4)
Cholesterol, Total: 211 mg/dL — ABNORMAL HIGH (ref 100–199)
HDL: 62 mg/dL (ref >39)
LDL Chol Calc (NIH): 132 mg/dL — ABNORMAL HIGH (ref 0–99)
Triglycerides: 93 mg/dL (ref 0–149)
VLDL Cholesterol Cal: 17 mg/dL (ref 5–40)

## 2023-11-20 LAB — B12 AND FOLATE PANEL
Folate: >20 ng/mL (ref >3.0)
Vitamin B-12: 1341 pg/mL — ABNORMAL HIGH (ref 232–1245)

## 2023-11-20 LAB — TSH+FREE T4
Free T4: 1.19 ng/dL (ref 0.82–1.77)
TSH: 2.07 u[IU]/mL (ref 0.450–4.500)

## 2023-11-20 LAB — CBC WITH DIFFERENTIAL/PLATELET
Basophils Absolute: 0.1 10*3/uL (ref 0.0–0.2)
Basos: 1 %
EOS (ABSOLUTE): 0.1 10*3/uL (ref 0.0–0.4)
Eos: 2 %
Hematocrit: 36.9 % (ref 34.0–46.6)
Hemoglobin: 11.5 g/dL (ref 11.1–15.9)
Immature Grans (Abs): 0 10*3/uL (ref 0.0–0.1)
Immature Granulocytes: 0 %
Lymphocytes Absolute: 2.1 10*3/uL (ref 0.7–3.1)
Lymphs: 45 %
MCH: 29.4 pg (ref 26.6–33.0)
MCHC: 31.2 g/dL — ABNORMAL LOW (ref 31.5–35.7)
MCV: 94 fL (ref 79–97)
Monocytes Absolute: 0.5 10*3/uL (ref 0.1–0.9)
Monocytes: 11 %
Neutrophils Absolute: 1.9 10*3/uL (ref 1.4–7.0)
Neutrophils: 41 %
Platelets: 247 10*3/uL (ref 150–450)
RBC: 3.91 x10E6/uL (ref 3.77–5.28)
RDW: 12.8 % (ref 11.7–15.4)
WBC: 4.6 10*3/uL (ref 3.4–10.8)

## 2023-11-20 LAB — VITAMIN D 25 HYDROXY (VIT D DEFICIENCY, FRACTURES): Vit D, 25-Hydroxy: 43 ng/mL (ref 30.0–100.0)

## 2023-11-21 ENCOUNTER — Encounter: Payer: Self-pay | Admitting: Nurse Practitioner

## 2023-11-29 LAB — COLOGUARD: COLOGUARD: NEGATIVE

## 2023-12-10 ENCOUNTER — Encounter: Payer: Self-pay | Admitting: Nurse Practitioner

## 2023-12-10 ENCOUNTER — Ambulatory Visit: Payer: 59 | Admitting: Nurse Practitioner

## 2023-12-10 VITALS — BP 126/78 | HR 67 | Temp 98.1°F | Resp 16 | Ht 65.0 in | Wt 224.2 lb

## 2023-12-10 DIAGNOSIS — M792 Neuralgia and neuritis, unspecified: Secondary | ICD-10-CM

## 2023-12-10 DIAGNOSIS — M2550 Pain in unspecified joint: Secondary | ICD-10-CM | POA: Diagnosis not present

## 2023-12-10 DIAGNOSIS — G8929 Other chronic pain: Secondary | ICD-10-CM

## 2023-12-10 DIAGNOSIS — K219 Gastro-esophageal reflux disease without esophagitis: Secondary | ICD-10-CM

## 2023-12-10 DIAGNOSIS — E039 Hypothyroidism, unspecified: Secondary | ICD-10-CM | POA: Diagnosis not present

## 2023-12-10 DIAGNOSIS — Z0001 Encounter for general adult medical examination with abnormal findings: Secondary | ICD-10-CM

## 2023-12-10 DIAGNOSIS — F3342 Major depressive disorder, recurrent, in full remission: Secondary | ICD-10-CM

## 2023-12-10 DIAGNOSIS — G4709 Other insomnia: Secondary | ICD-10-CM

## 2023-12-10 MED ORDER — LEVOTHYROXINE SODIUM 100 MCG PO TABS: 100.0000 ug | ORAL_TABLET | Freq: Every day | ORAL | 1 refills | Status: DC

## 2023-12-10 MED ORDER — MELOXICAM 15 MG PO TABS: 15.0000 mg | ORAL_TABLET | Freq: Every day | ORAL | 1 refills | Status: DC

## 2023-12-10 MED ORDER — TRAMADOL HCL 50 MG PO TABS: 50.0000 mg | ORAL_TABLET | Freq: Four times a day (QID) | ORAL | 0 refills | Status: AC | PRN

## 2023-12-10 MED ORDER — TRAZODONE HCL 50 MG PO TABS: 50.0000 mg | ORAL_TABLET | Freq: Every evening | ORAL | 1 refills | Status: DC | PRN

## 2023-12-10 MED ORDER — SERTRALINE HCL 50 MG PO TABS: ORAL_TABLET | ORAL | 2 refills | Status: DC

## 2023-12-10 MED ORDER — LINACLOTIDE 145 MCG PO CAPS: ORAL_CAPSULE | ORAL | 1 refills | Status: DC

## 2023-12-10 MED ORDER — OMEPRAZOLE 40 MG PO CPDR: 40.0000 mg | DELAYED_RELEASE_CAPSULE | Freq: Two times a day (BID) | ORAL | 1 refills | Status: DC

## 2023-12-10 MED ORDER — GABAPENTIN 600 MG PO TABS: ORAL_TABLET | ORAL | 1 refills | Status: DC

## 2023-12-10 NOTE — Progress Notes (Signed)
 [facility] [address] [place], [place] [address]  Internal MEDICINE  Office Visit Note  Patient Name: Sherry Evans  [id number]  [id number]  Date of Service: 12/10/2023  Chief Complaint  Patient presents with   Depression   Gastroesophageal Reflux   Hyperlipidemia   Annual Exam    HPI Sherry Evans presents for an annual well visit and physical exam.  Well-appearing 63 y.o. female with OSA, GERD, IBS, DDD, high cholesterol, allergies, depression and anxiety. Recently treated for hemorrhoids Routine CRC screening: cologuard due in march 2028 Routine mammogram: done in January this year, due in 2026 DEXA scan: due in 5 more years  Pap smear: due in 2028 Eye exam and/or foot exam: Labs: lab results reviewed with patient today New or worsening pain: low back pain, chronic. Bothering a little more now, asking for small amount of tramadol. Takes meloxicam daily.  Other concerns: none    Current Medication: Outpatient Encounter Medications as of 12/10/2023  Medication Sig   budesonide (PULMICORT) 0.5 MG/2ML nebulizer solution Take 0.5 mg by nebulization 2 (two) times daily as needed. Nasal rinse (not used as nebulizer)   docusate sodium (COLACE) 100 MG capsule Take 100 mg by mouth daily as needed for mild constipation.   hydrocortisone (ANUSOL-HC) 25 MG suppository Place 1 suppository (25 mg total) rectally 2 (two) times daily as needed for hemorrhoids or anal itching.   levocetirizine (XYZAL) 5 MG tablet TAKE 1 TABLET (5 MG TOTAL) BY MOUTH DAILY.   Magnesium 400 MG CAPS Take 2 capsules by mouth daily.   montelukast (SINGULAIR) 10 MG tablet TAKE 1 TABLET BY MOUTH DAILY FOR ALLERGIES   MULTIPLE VITAMIN PO Take 2 capsules by mouth daily.   nystatin-triamcinolone ointment (MYCOLOG) Apply topically 2 (two) times daily as needed.   traMADol (ULTRAM) 50 MG tablet Take 1 tablet (50 mg total) by mouth every 6 (six) hours as needed for up to 5 days for moderate pain (pain score  4-6) or severe pain (pain score 7-10).   TURMERIC-GINGER PO Take 2 tablets by mouth daily.   VITAMIN D-VITAMIN K PO Take 1 capsule by mouth daily.   vitamin E 1000 UNIT capsule Take 1,000 Units by mouth daily.   [DISCONTINUED] doxycycline (VIBRA-TABS) 100 MG tablet Take 1 tablet (100 mg total) by mouth 2 (two) times daily.   [DISCONTINUED] gabapentin (NEURONTIN) 600 MG tablet TAKE 3 TABLETS BY MOUTH AT EVENING MEAL   [DISCONTINUED] levothyroxine (SYNTHROID) 100 MCG tablet Take 1 tablet (100 mcg total) by mouth daily.   [DISCONTINUED] linaclotide (LINZESS) 145 MCG CAPS capsule TAKE 1 CAPSULE BY MOUTH DAILY BEFORE BREAKFAST.   [DISCONTINUED] meloxicam (MOBIC) 15 MG tablet Take 1 tablet (15 mg total) by mouth daily.   [DISCONTINUED] omeprazole (PRILOSEC) 40 MG capsule TAKE 1 CAPSULE (40 MG TOTAL) BY MOUTH IN THE MORNING AND AT BEDTIME. MAY START WITH ONCE DAILY AND WORK UP TO TWICE DAILY.   [DISCONTINUED] sertraline (ZOLOFT) 50 MG tablet TAKE HALF TAB A DAY FOR ONE WEEK AND THEN INCREASE TO ONE TAB A DAY   [DISCONTINUED] traZODone (DESYREL) 50 MG tablet TAKE 1 TABLET (50 MG TOTAL) BY MOUTH AT BEDTIME AS NEEDED. (Patient taking differently: Take 2 tablets at bedtime)   gabapentin (NEURONTIN) 600 MG tablet TAKE 3 TABLETS BY MOUTH AT EVENING MEAL   levothyroxine (SYNTHROID) 100 MCG tablet Take 1 tablet (100 mcg total) by mouth daily.   linaclotide (LINZESS) 145 MCG CAPS capsule TAKE 1 CAPSULE BY MOUTH DAILY BEFORE BREAKFAST.  meloxicam (MOBIC) 15 MG tablet Take 1 tablet (15 mg total) by mouth daily.   omeprazole (PRILOSEC) 40 MG capsule Take 1 capsule (40 mg total) by mouth in the morning and at bedtime. May start with once daily and work up to twice daily.   sertraline (ZOLOFT) 50 MG tablet Take half tab a day for one week and then increase to one tab a day   traZODone (DESYREL) 50 MG tablet Take 1-2 tablets (50-100 mg total) by mouth at bedtime as needed for sleep. Take 2 tablets at bedtime   No  facility-administered encounter medications on file as of 12/10/2023.    Surgical History: Past Surgical History:  Procedure Laterality Date   ADENOIDECTOMY  1969   BICEPT TENODESIS Right 04/02/2016   Procedure: BICEPS TENODESIS;  Surgeon: Christena Flake, MD;  Location: [facility] ORS;  Service: Orthopedics;  Laterality: Right;   BLADDER SUSPENSION  2009   BREAST BIOPSY Left 1990's   benign   BREAST BIOPSY Left 10/28/2015   SMALL FRAGMENTS OF LYMPHOID TISSUE   CHOLECYSTECTOMY     COLONOSCOPY     DILATION AND CURETTAGE OF UTERUS     GASTRIC RESTRICTION SURGERY  08/23/2015   HIATAL HERNIA REPAIR  2016   JOINT REPLACEMENT Bilateral 2014   Partial Knee Replacement,  Surgeon: Dr. Elfredia Nevins, [facility]   LUMBAR DISC SURGERY  2008   LUMBAR LAMINECTOMY/DECOMPRESSION MICRODISCECTOMY Right 02/24/2023   Procedure: RIGHT L5-S1 MICRODISCECTOMY;  Surgeon: Venetia Night, MD;  Location: [facility] ORS;  Service: Neurosurgery;  Laterality: Right;   SHOULDER ARTHROSCOPY WITH DEBRIDEMENT AND BICEP TENDON REPAIR Right 04/02/2016   Procedure: SHOULDER ARTHROSCOPY WITH DEBRIDEMENT ;  Surgeon: Christena Flake, MD;  Location: [facility] OR[facility];  Service: Orthopedics;  Laterality: Right;   SHOULDER ARTHROSCOPY WITH OPEN ROTATOR CUFF REPAIR Right 04/02/2016   Procedure: SHOULDER ARTHROSCOPY WITH OPEN ROTATOR CUFF REPAIR;  Surgeon: Christena Flake, MD;  Location: [facility] OR[facility];  Service: Orthopedics;  Laterality: Right;   SHOULDER ARTHROSCOPY WITH SUBACROMIAL DECOMPRESSION Right 04/02/2016   Procedure: SHOULDER ARTHROSCOPY WITH SUBACROMIAL DECOMPRESSION;  Surgeon: Christena Flake, MD;  Location: [facility];  Service: Orthopedics;  Laterality: Right;   TENDON RELEASE Bilateral 08/2021   thumbs    Medical History: Past Medical History:  Diagnosis Date   Abnormal EKG    Anxiety    DDD (degenerative disc disease), lumbar    Depression    GERD (gastroesophageal reflux disease)    History of sinus surgery    3 times   Hyperlipidemia     Hypothyroidism    Irritable bowel syndrome with constipation    Lumbar radiculitis    Obesity    Palpitations    Sleep apnea    not needed after weight loss surgery   Trigger finger     Family History: Family History  Problem Relation Age of Onset   Hypertension Father    Diabetes Father    Breast cancer Maternal Aunt        great aunts. 70-80   Breast cancer Maternal Grandmother 63       metatastic to breast   Ovarian cancer Maternal Grandmother    Breast cancer Paternal Grandmother        38's   Colon cancer Neg Hx     Social History   Socioeconomic History   Marital status: Married    Spouse name: Leta Speller   Number of children: 1   Years of education: Not on file   Highest education  level: Not on file  Occupational History   Not on file  Tobacco Use   Smoking status: Never   Smokeless tobacco: Never  Vaping Use   Vaping status: Never Used  Substance and Sexual Activity   Alcohol use: No    Alcohol/week: 0.0 standard drinks of alcohol   Drug use: No   Sexual activity: Yes    Birth control/protection: Post-menopausal  Other Topics Concern   Not on file  Social History Narrative   Not on file   Social Drivers of Health   Financial Resource Strain: Not on file  Food Insecurity: Not on file  Transportation Needs: Not on file  Physical Activity: Inactive (12/08/2017)   Exercise Vital Sign    Days of Exercise per Week: 0 days    Minutes of Exercise per Session: 0 min  Stress: Not on file  Social Connections: Not on file  Intimate Partner Violence: Not on file      Review of Systems  Constitutional:  Negative for activity change, appetite change, chills, fatigue, fever and unexpected weight change.  HENT: Negative.  Negative for congestion, ear pain, rhinorrhea, sore throat and trouble swallowing.   Eyes: Negative.   Respiratory: Negative.  Negative for cough, chest tightness, shortness of breath and wheezing.   Cardiovascular: Negative.  Negative  for chest pain and palpitations.  Gastrointestinal: Negative.  Negative for abdominal pain, blood in stool, constipation, diarrhea, nausea and vomiting.  Endocrine: Negative.   Genitourinary: Negative.  Negative for difficulty urinating, dysuria, frequency, hematuria and urgency.  Musculoskeletal: Negative.  Negative for arthralgias, back pain, joint swelling, myalgias and neck pain.  Skin: Negative.  Negative for rash and wound.  Allergic/Immunologic: Negative.  Negative for immunocompromised state.  Neurological: Negative.  Negative for dizziness, seizures, numbness and headaches.  Psychiatric/Behavioral: Negative.  Negative for behavioral problems, self-injury and suicidal ideas. The patient is not nervous/anxious.     Vital Signs: BP 126/78   Pulse 67   Temp 98.1 F (36.7 C)   Resp 16   Ht 5\' 5"  (1.651 m)   Wt 224 lb 3.2 oz (101.7 kg)   LMP 12/21/2015 (Approximate)   SpO2 99%   BMI 37.31 kg/m    Physical Exam Vitals reviewed.  Constitutional:      General: She is awake. She is not in acute distress.    Appearance: Normal appearance. She is well-developed and well-groomed. She is obese. She is not ill-appearing or diaphoretic.  HENT:     Head: Normocephalic and atraumatic.     Right Ear: Tympanic membrane, ear canal and external ear normal.     Left Ear: Tympanic membrane, ear canal and external ear normal.     Nose: Nose normal. No congestion or rhinorrhea.     Mouth/Throat:     Lips: Pink.     Mouth: Mucous membranes are moist.     Pharynx: Oropharynx is clear. Uvula midline. No oropharyngeal exudate or posterior oropharyngeal erythema.  Eyes:     General: Lids are normal. Vision grossly intact. Gaze aligned appropriately. No scleral icterus.       Right eye: No discharge.        Left eye: No discharge.     Extraocular Movements: Extraocular movements intact.     Conjunctiva/sclera: Conjunctivae normal.     Pupils: Pupils are equal, round, and reactive to light.      Funduscopic exam:    Right eye: Red reflex present.        Left eye:  Red reflex present. Neck:     Thyroid: No thyromegaly.     Vascular: No JVD.     Trachea: Trachea and phonation normal. No tracheal deviation.  Cardiovascular:     Rate and Rhythm: Normal rate and regular rhythm.     Pulses: Normal pulses.     Heart sounds: Normal heart sounds, S1 normal and S2 normal. No murmur heard.    No friction rub. No gallop.  Pulmonary:     Effort: Pulmonary effort is normal. No accessory muscle usage or respiratory distress.     Breath sounds: Normal breath sounds and air entry. No stridor. No wheezing or rales.  Chest:     Chest wall: No tenderness.     Comments: Declined clinical breast exam.  Abdominal:     General: Bowel sounds are normal. There is no distension.     Palpations: Abdomen is soft. There is no shifting dullness, fluid wave, mass or pulsatile mass.     Tenderness: There is no abdominal tenderness. There is no guarding or rebound.  Musculoskeletal:        General: No tenderness or deformity. Normal range of motion.     Cervical back: Normal range of motion and neck supple.     Right lower leg: No edema.     Left lower leg: No edema.  Lymphadenopathy:     Cervical: No cervical adenopathy.  Skin:    General: Skin is warm and dry.     Capillary Refill: Capillary refill takes less than 2 seconds.     Coloration: Skin is not pale.     Findings: No erythema or rash.  Neurological:     Mental Status: She is alert and oriented to person, place, and time.     Cranial Nerves: No cranial nerve deficit.     Motor: No abnormal muscle tone.     Coordination: Coordination normal.     Deep Tendon Reflexes: Reflexes are normal and symmetric.  Psychiatric:        Mood and Affect: Mood normal.        Behavior: Behavior normal. Behavior is cooperative.        Thought Content: Thought content normal.        Judgment: Judgment normal.        Assessment/Plan: 1. Encounter for  routine adult health examination with abnormal findings (Primary) Age-appropriate preventive screenings and vaccinations discussed, annual physical exam completed. Routine labs for health maintenance results discussed with patient today. PHM updated.   - linaclotide (LINZESS) 145 MCG CAPS capsule; TAKE 1 CAPSULE BY MOUTH DAILY BEFORE BREAKFAST.  Dispense: 90 capsule; Refill: 1  2. Acquired hypothyroidism Stable, continue levothyroxine as prescribed.  - levothyroxine (SYNTHROID) 100 MCG tablet; Take 1 tablet (100 mcg total) by mouth daily.  Dispense: 90 tablet; Refill: 1  3. Gastroesophageal reflux disease without esophagitis Stable, continue omeprazole as prescribed.  - omeprazole (PRILOSEC) 40 MG capsule; Take 1 capsule (40 mg total) by mouth in the morning and at bedtime. May start with once daily and work up to twice daily.  Dispense: 180 capsule; Refill: 1  4. Chronic peripheral neuropathic pain Continue gabapentin as prescribed.  - gabapentin (NEURONTIN) 600 MG tablet; TAKE 3 TABLETS BY MOUTH AT EVENING MEAL  Dispense: 270 tablet; Refill: 1  5. Arthralgia of multiple joints Continue meloxicam as prescribed. Small amount of prn tramadol prescribed for low back pain  - meloxicam (MOBIC) 15 MG tablet; Take 1 tablet (15 mg total) by mouth daily.  Dispense: 90 tablet; Refill: 1 - traMADol (ULTRAM) 50 MG tablet; Take 1 tablet (50 mg total) by mouth every 6 (six) hours as needed for up to 5 days for moderate pain (pain score 4-6) or severe pain (pain score 7-10).  Dispense: 20 tablet; Refill: 0  6. Other insomnia Continue trazodone as prescribed. - traZODone (DESYREL) 50 MG tablet; Take 1-2 tablets (50-100 mg total) by mouth at bedtime as needed for sleep. Take 2 tablets at bedtime  Dispense: 180 tablet; Refill: 1  7. Recurrent major depressive disorder, in full remission (HCC) Continue sertraline as prescribed.  - sertraline (ZOLOFT) 50 MG tablet; Take half tab a day for one week and then  increase to one tab a day  Dispense: 90 tablet; Refill: 2     General Counseling: Sherry Evans verbalizes understanding of the findings of todays visit and agrees with plan of treatment. I have discussed any further diagnostic evaluation that may be needed or ordered today. We also reviewed her medications today. she has been encouraged to call the office with any questions or concerns that should arise related to todays visit.    No orders of the defined types were placed in this encounter.   Meds ordered this encounter  Medications   gabapentin (NEURONTIN) 600 MG tablet    Sig: TAKE 3 TABLETS BY MOUTH AT EVENING MEAL    Dispense:  270 tablet    Refill:  1   levothyroxine (SYNTHROID) 100 MCG tablet    Sig: Take 1 tablet (100 mcg total) by mouth daily.    Dispense:  90 tablet    Refill:  1    For future refills   linaclotide (LINZESS) 145 MCG CAPS capsule    Sig: TAKE 1 CAPSULE BY MOUTH DAILY BEFORE BREAKFAST.    Dispense:  90 capsule    Refill:  1    For future refills, do not send now, she just got some.   meloxicam (MOBIC) 15 MG tablet    Sig: Take 1 tablet (15 mg total) by mouth daily.    Dispense:  90 tablet    Refill:  1   omeprazole (PRILOSEC) 40 MG capsule    Sig: Take 1 capsule (40 mg total) by mouth in the morning and at bedtime. May start with once daily and work up to twice daily.    Dispense:  180 capsule    Refill:  1   sertraline (ZOLOFT) 50 MG tablet    Sig: Take half tab a day for one week and then increase to one tab a day    Dispense:  90 tablet    Refill:  2   traZODone (DESYREL) 50 MG tablet    Sig: Take 1-2 tablets (50-100 mg total) by mouth at bedtime as needed for sleep. Take 2 tablets at bedtime    Dispense:  180 tablet    Refill:  1    Note change in prescription, fill new script   traMADol (ULTRAM) 50 MG tablet    Sig: Take 1 tablet (50 mg total) by mouth every 6 (six) hours as needed for up to 5 days for moderate pain (pain score 4-6) or severe pain  (pain score 7-10).    Dispense:  20 tablet    Refill:  0    Fill new script today.    Return in about 6 months (around 06/11/2024) for F/U, Sherry Evans Min PCP.   Total time spent:30 Minutes Time spent includes review of chart, medications, test results, and  follow up plan with the patient.   [place] Controlled Substance Database was reviewed by me.  This patient was seen by Sallyanne Kuster, FNP-C in collaboration with Dr. Beverely Risen as a part of collaborative care agreement.  Malyk Girouard R. Tedd Sias, MSN, FNP-C Internal medicine

## 2023-12-31 ENCOUNTER — Encounter: Payer: Self-pay | Admitting: Nurse Practitioner

## 2024-02-09 ENCOUNTER — Telehealth: Payer: Self-pay

## 2024-02-09 NOTE — Telephone Encounter (Signed)
 Pt called that she had fire ant bite she like to know she can take benadryl with she is using topical hydrocortisone  as per alyssa advised her to ok to take benadryl be careful its make her drowsy if not feeling better need appt

## 2024-02-17 ENCOUNTER — Ambulatory Visit (INDEPENDENT_AMBULATORY_CARE_PROVIDER_SITE_OTHER): Admitting: Nurse Practitioner

## 2024-02-17 ENCOUNTER — Encounter: Payer: Self-pay | Admitting: Nurse Practitioner

## 2024-02-17 VITALS — BP 128/72 | HR 76 | Temp 97.9°F | Resp 16 | Ht 65.0 in | Wt 227.4 lb

## 2024-02-17 DIAGNOSIS — N390 Urinary tract infection, site not specified: Secondary | ICD-10-CM | POA: Diagnosis not present

## 2024-02-17 DIAGNOSIS — T3695XA Adverse effect of unspecified systemic antibiotic, initial encounter: Secondary | ICD-10-CM | POA: Diagnosis not present

## 2024-02-17 DIAGNOSIS — B379 Candidiasis, unspecified: Secondary | ICD-10-CM | POA: Diagnosis not present

## 2024-02-17 DIAGNOSIS — G4709 Other insomnia: Secondary | ICD-10-CM

## 2024-02-17 MED ORDER — BELSOMRA 15 MG PO TABS: 15.0000 mg | ORAL_TABLET | Freq: Every evening | ORAL | 2 refills | Status: DC | PRN

## 2024-02-17 MED ORDER — NITROFURANTOIN MONOHYD MACRO 100 MG PO CAPS: 100.0000 mg | ORAL_CAPSULE | Freq: Two times a day (BID) | ORAL | 0 refills | Status: AC

## 2024-02-17 MED ORDER — FLUCONAZOLE 150 MG PO TABS: 150.0000 mg | ORAL_TABLET | Freq: Once | ORAL | 0 refills | Status: AC

## 2024-02-17 NOTE — Progress Notes (Signed)
 [facility] [address] [place], [place] [address]  Internal MEDICINE  Office Visit Note  Patient Name: Sherry Evans  [id number]  [id number]  Date of Service: 02/17/2024  Chief Complaint  Patient presents with   Acute Visit    uti     HPI Sherry Evans presents for an acute sick visit for possible UTI.  --onset of symptoms was a couple of weeks ago with increased frequency and urgency initially.  --reports that she is now having back pain and burning with urination Tried to give a urine specimen but was unable to do so.    Also having trouble sleeping, the trazodone  is not helping enough anymore. She is waking up around 3 am almost every night and cannot go back to sleep    Current Medication:  Outpatient Encounter Medications as of 02/17/2024  Medication Sig   budesonide (PULMICORT) 0.5 MG/2ML nebulizer solution Take 0.5 mg by nebulization 2 (two) times daily as needed. Nasal rinse (not used as nebulizer)   docusate sodium  (COLACE) 100 MG capsule Take 100 mg by mouth daily as needed for mild constipation.   fluconazole  (DIFLUCAN ) 150 MG tablet Take 1 tablet (150 mg total) by mouth once for 1 dose. May take an additional dose after 3 days if still symptomatic.   gabapentin  (NEURONTIN ) 600 MG tablet TAKE 3 TABLETS BY MOUTH AT EVENING MEAL   hydrocortisone  (ANUSOL -HC) 25 MG suppository Place 1 suppository (25 mg total) rectally 2 (two) times daily as needed for hemorrhoids or anal itching.   levocetirizine (XYZAL ) 5 MG tablet TAKE 1 TABLET (5 MG TOTAL) BY MOUTH DAILY.   levothyroxine  (SYNTHROID ) 100 MCG tablet Take 1 tablet (100 mcg total) by mouth daily.   linaclotide  (LINZESS ) 145 MCG CAPS capsule TAKE 1 CAPSULE BY MOUTH DAILY BEFORE BREAKFAST.   Magnesium 400 MG CAPS Take 2 capsules by mouth daily.   meloxicam  (MOBIC ) 15 MG tablet Take 1 tablet (15 mg total) by mouth daily.   montelukast  (SINGULAIR ) 10 MG tablet TAKE 1 TABLET BY MOUTH DAILY FOR ALLERGIES   MULTIPLE  VITAMIN PO Take 2 capsules by mouth daily.   nitrofurantoin , macrocrystal-monohydrate, (MACROBID ) 100 MG capsule Take 1 capsule (100 mg total) by mouth 2 (two) times daily for 7 days. Take with food   nystatin -triamcinolone  ointment (MYCOLOG) Apply topically 2 (two) times daily as needed.   omeprazole  (PRILOSEC) 40 MG capsule Take 1 capsule (40 mg total) by mouth in the morning and at bedtime. May start with once daily and work up to twice daily.   sertraline  (ZOLOFT ) 50 MG tablet Take half tab a day for one week and then increase to one tab a day   Suvorexant  (BELSOMRA ) 15 MG TABS Take 1 tablet (15 mg total) by mouth at bedtime as needed (insomnia).   traZODone  (DESYREL ) 50 MG tablet Take 1-2 tablets (50-100 mg total) by mouth at bedtime as needed for sleep. Take 2 tablets at bedtime   TURMERIC-GINGER PO Take 2 tablets by mouth daily.   VITAMIN D -VITAMIN K PO Take 1 capsule by mouth daily.   vitamin E 1000 UNIT capsule Take 1,000 Units by mouth daily.   No facility-administered encounter medications on file as of 02/17/2024.      Medical History: Past Medical History:  Diagnosis Date   Abnormal EKG    Anxiety    DDD (degenerative disc disease), lumbar    Depression    GERD (gastroesophageal reflux disease)    History of sinus surgery  3 times   Hyperlipidemia    Hypothyroidism    Irritable bowel syndrome with constipation    Lumbar radiculitis    Obesity    Palpitations    Sleep apnea    not needed after weight loss surgery   Trigger finger      Vital Signs: BP 128/72   Pulse 76   Temp 97.9 F (36.6 C)   Resp 16   Ht 5\' 5"  (1.651 m)   Wt 227 lb 6.4 oz (103.1 kg)   LMP 12/21/2015 (Approximate)   SpO2 97%   BMI 37.84 kg/m    Review of Systems  Constitutional:  Positive for fatigue.  Respiratory:  Negative for cough, chest tightness, shortness of breath and wheezing.   Cardiovascular:  Negative for chest pain and palpitations.  Gastrointestinal: Negative.    Genitourinary:  Positive for dysuria, frequency and urgency. Negative for hematuria.  Musculoskeletal: Negative.   Psychiatric/Behavioral:  Positive for sleep disturbance. Negative for self-injury. The patient is not nervous/anxious.     Physical Exam Vitals reviewed.  Constitutional:      General: She is not in acute distress.    Appearance: Normal appearance. She is obese. She is not ill-appearing.  HENT:     Head: Normocephalic and atraumatic.  Eyes:     Pupils: Pupils are equal, round, and reactive to light.  Cardiovascular:     Rate and Rhythm: Normal rate and regular rhythm.  Pulmonary:     Effort: Pulmonary effort is normal. No respiratory distress.  Neurological:     Mental Status: She is alert and oriented to person, place, and time.  Psychiatric:        Mood and Affect: Mood normal.        Behavior: Behavior normal.       Assessment/Plan: 1. Urinary tract infection without hematuria, site unspecified (Primary) Macrobid  prescribed, take until gone.  - nitrofurantoin , macrocrystal-monohydrate, (MACROBID ) 100 MG capsule; Take 1 capsule (100 mg total) by mouth 2 (two) times daily for 7 days. Take with food  Dispense: 14 capsule; Refill: 0  2. Antibiotic-induced yeast infection Take fluconazole  as prescribed.  - fluconazole  (DIFLUCAN ) 150 MG tablet; Take 1 tablet (150 mg total) by mouth once for 1 dose. May take an additional dose after 3 days if still symptomatic.  Dispense: 3 tablet; Refill: 0  3. Other insomnia Will try belsomra , call clinic and follow up sooner if the medication is not helping.  - Suvorexant  (BELSOMRA ) 15 MG TABS; Take 1 tablet (15 mg total) by mouth at bedtime as needed (insomnia).  Dispense: 30 tablet; Refill: 2   General Counseling: Sherry Evans verbalizes understanding of the findings of todays visit and agrees with plan of treatment. I have discussed any further diagnostic evaluation that may be needed or ordered today. We also reviewed her  medications today. she has been encouraged to call the office with any questions or concerns that should arise related to todays visit.    Counseling:    No orders of the defined types were placed in this encounter.   Meds ordered this encounter  Medications   nitrofurantoin , macrocrystal-monohydrate, (MACROBID ) 100 MG capsule    Sig: Take 1 capsule (100 mg total) by mouth 2 (two) times daily for 7 days. Take with food    Dispense:  14 capsule    Refill:  0    Fill new script today   fluconazole  (DIFLUCAN ) 150 MG tablet    Sig: Take 1 tablet (150 mg total) by  mouth once for 1 dose. May take an additional dose after 3 days if still symptomatic.    Dispense:  3 tablet    Refill:  0    Fill new script today   Suvorexant  (BELSOMRA ) 15 MG TABS    Sig: Take 1 tablet (15 mg total) by mouth at bedtime as needed (insomnia).    Dispense:  30 tablet    Refill:  2    Discontinue trazodone  and fill new script today    Return in about 3 months (around 05/10/2024) for F/U, Parrie Rasco PCP, eval new med belsomra .  [place] Controlled Substance Database was reviewed by me for overdose risk score (ORS)  Time spent:30 Minutes Time spent with patient included reviewing progress notes, labs, imaging studies, and discussing plan for follow up.   This patient was seen by Laurence Pons, FNP-C in collaboration with Dr. Verneta Gone as a part of collaborative care agreement.  Annalea Alguire R. Bobbi Burow, MSN, FNP-C Internal Medicine

## 2024-02-20 ENCOUNTER — Encounter: Payer: Self-pay | Admitting: Nurse Practitioner

## 2024-02-22 ENCOUNTER — Encounter: Payer: Self-pay | Admitting: Nurse Practitioner

## 2024-02-23 ENCOUNTER — Other Ambulatory Visit: Payer: Self-pay

## 2024-02-23 ENCOUNTER — Telehealth: Payer: Self-pay

## 2024-02-23 DIAGNOSIS — R300 Dysuria: Secondary | ICD-10-CM

## 2024-02-23 NOTE — Telephone Encounter (Addendum)
 Completed P.A. for patient's Belsomra  and it was approved. Notified patient.

## 2024-02-23 NOTE — Telephone Encounter (Signed)
 Spoke with pt and as per Sherry Evans put lab order for UA and finished your antibiotics

## 2024-02-25 ENCOUNTER — Telehealth: Payer: Self-pay

## 2024-02-25 LAB — UA/M W/RFLX CULTURE, ROUTINE
Bilirubin, UA: NEGATIVE
Glucose, UA: NEGATIVE
Leukocytes,UA: NEGATIVE
Nitrite, UA: NEGATIVE
RBC, UA: NEGATIVE
Specific Gravity, UA: >=1.03 — AB (ref 1.005–1.030)
Urobilinogen, Ur: 0.2 mg/dL (ref 0.2–1.0)
pH, UA: 5 (ref 5.0–7.5)

## 2024-02-25 LAB — MICROSCOPIC EXAMINATION
Bacteria, UA: NONE SEEN
Casts: NONE SEEN /LPF
RBC, Urine: NONE SEEN /HPF (ref 0–2)
WBC, UA: NONE SEEN /HPF (ref 0–5)

## 2024-02-25 NOTE — Telephone Encounter (Signed)
 Patient called asking for her urine culture results. She states she is still having back pain, urine frequency, low grade fever. Asked AA to review results and see if another round of ABX is needed. Advised patient to go to urgent care if symptoms get worse.

## 2024-02-27 ENCOUNTER — Encounter: Admitting: Nurse Practitioner

## 2024-02-27 DIAGNOSIS — R399 Unspecified symptoms and signs involving the genitourinary system: Secondary | ICD-10-CM

## 2024-02-27 NOTE — Progress Notes (Signed)
 Urine was negative for UTI however it did show calcium which could be a sign of kidney stones. At this time I would recommend a face to face visit with your PCP or urgent care if your pain worsens as we would not be able to treat based on your current urine results.     NOTE: There will be NO CHARGE for this E-Visit   If you are having a true medical emergency, please call 911.     For an urgent face to face visit, [facility] has multiple urgent care centers for your convenience.  Click the link below for the full list of locations and hours, walk-in wait times, appointment scheduling options and driving directions:  Urgent Care - [place], [place], [place], [place], [place], [place]  [place]     Your MyChart E-visit questionnaire answers were reviewed by a board certified advanced clinical practitioner to complete your personal care plan based on your specific symptoms.    Thank you for using e-Visits.

## 2024-02-27 NOTE — Progress Notes (Signed)
 I have spent 5 minutes in review of e-visit questionnaire, review and updating patient chart, medical decision making and response to patient.   Claiborne Rigg, NP

## 2024-02-28 ENCOUNTER — Ambulatory Visit: Admitting: Nurse Practitioner

## 2024-02-28 ENCOUNTER — Encounter: Payer: Self-pay | Admitting: Nurse Practitioner

## 2024-02-28 VITALS — BP 126/74 | HR 97 | Temp 97.0°F | Resp 16 | Ht 65.0 in | Wt 225.2 lb

## 2024-02-28 DIAGNOSIS — N3000 Acute cystitis without hematuria: Secondary | ICD-10-CM

## 2024-02-28 MED ORDER — CIPROFLOXACIN HCL 500 MG PO TABS: 500.0000 mg | ORAL_TABLET | Freq: Two times a day (BID) | ORAL | 0 refills | Status: AC

## 2024-02-28 NOTE — Telephone Encounter (Signed)
 Patient being seen in office today by AA.

## 2024-02-28 NOTE — Progress Notes (Signed)
 [facility] [address] [place], [place] [address]  Internal MEDICINE  Office Visit Note  Patient Name: Sherry Evans  [id number]  [id number]  Date of Service: 02/28/2024  Chief Complaint  Patient presents with   Acute Visit    Uti--- a lot of pain, running fever, back pain     HPI Sherry Evans presents for an acute sick visit for persistent UTI --was originally seen on 02/17/24 for UTI and was treated but was unable to void. Symptoms have persisted with urinary discomfort, back pain and fever off and on. Urinalysis was sent but was negative and did not reflex to culture.  Urine specimen will be sent for culture today.      Current Medication:  Outpatient Encounter Medications as of 02/28/2024  Medication Sig   budesonide (PULMICORT) 0.5 MG/2ML nebulizer solution Take 0.5 mg by nebulization 2 (two) times daily as needed. Nasal rinse (not used as nebulizer)   ciprofloxacin  (CIPRO ) 500 MG tablet Take 1 tablet (500 mg total) by mouth 2 (two) times daily for 7 days. Take with food   docusate sodium  (COLACE) 100 MG capsule Take 100 mg by mouth daily as needed for mild constipation.   gabapentin  (NEURONTIN ) 600 MG tablet TAKE 3 TABLETS BY MOUTH AT EVENING MEAL   hydrocortisone  (ANUSOL -HC) 25 MG suppository Place 1 suppository (25 mg total) rectally 2 (two) times daily as needed for hemorrhoids or anal itching.   levocetirizine (XYZAL ) 5 MG tablet TAKE 1 TABLET (5 MG TOTAL) BY MOUTH DAILY.   levothyroxine  (SYNTHROID ) 100 MCG tablet Take 1 tablet (100 mcg total) by mouth daily.   linaclotide  (LINZESS ) 145 MCG CAPS capsule TAKE 1 CAPSULE BY MOUTH DAILY BEFORE BREAKFAST.   Magnesium 400 MG CAPS Take 2 capsules by mouth daily.   meloxicam  (MOBIC ) 15 MG tablet Take 1 tablet (15 mg total) by mouth daily.   montelukast  (SINGULAIR ) 10 MG tablet TAKE 1 TABLET BY MOUTH DAILY FOR ALLERGIES   MULTIPLE VITAMIN PO Take 2 capsules by mouth daily.   nystatin -triamcinolone  ointment (MYCOLOG)  Apply topically 2 (two) times daily as needed.   omeprazole  (PRILOSEC) 40 MG capsule Take 1 capsule (40 mg total) by mouth in the morning and at bedtime. May start with once daily and work up to twice daily.   sertraline  (ZOLOFT ) 50 MG tablet Take half tab a day for one week and then increase to one tab a day   Suvorexant  (BELSOMRA ) 15 MG TABS Take 1 tablet (15 mg total) by mouth at bedtime as needed (insomnia).   traZODone  (DESYREL ) 50 MG tablet Take 1-2 tablets (50-100 mg total) by mouth at bedtime as needed for sleep. Take 2 tablets at bedtime   TURMERIC-GINGER PO Take 2 tablets by mouth daily.   VITAMIN D -VITAMIN K PO Take 1 capsule by mouth daily.   vitamin E 1000 UNIT capsule Take 1,000 Units by mouth daily.   No facility-administered encounter medications on file as of 02/28/2024.      Medical History: Past Medical History:  Diagnosis Date   Abnormal EKG    Anxiety    DDD (degenerative disc disease), lumbar    Depression    GERD (gastroesophageal reflux disease)    History of sinus surgery    3 times   Hyperlipidemia    Hypothyroidism    Irritable bowel syndrome with constipation    Lumbar radiculitis    Obesity    Palpitations    Sleep apnea    not needed after weight  loss surgery   Trigger finger      Vital Signs: BP 126/74   Pulse 97   Temp (!) 97 F (36.1 C)   Resp 16   Ht 5' 5 (1.651 m)   Wt 225 lb 3.2 oz (102.2 kg)   LMP 12/21/2015 (Approximate)   SpO2 97%   BMI 37.48 kg/m    Review of Systems  Constitutional:  Positive for fatigue.  Respiratory: Negative.  Negative for cough, chest tightness, shortness of breath and wheezing.   Cardiovascular: Negative.  Negative for chest pain and palpitations.  Gastrointestinal: Negative.   Genitourinary:  Positive for dysuria, flank pain, frequency, hematuria, pelvic pain and urgency.    Physical Exam Vitals reviewed.  Constitutional:      General: She is not in acute distress.    Appearance: Normal  appearance. She is obese. She is not ill-appearing.  HENT:     Head: Normocephalic and atraumatic.  Eyes:     Pupils: Pupils are equal, round, and reactive to light.  Cardiovascular:     Rate and Rhythm: Normal rate and regular rhythm.  Pulmonary:     Effort: Pulmonary effort is normal. No respiratory distress.  Abdominal:     Tenderness: There is abdominal tenderness in the suprapubic area.  Neurological:     Mental Status: She is alert and oriented to person, place, and time.  Psychiatric:        Mood and Affect: Mood normal.        Behavior: Behavior normal.       Assessment/Plan: 1. Acute cystitis without hematuria (Primary) Urine sent for culture, cipro  prescribed, take until gone.  - ciprofloxacin  (CIPRO ) 500 MG tablet; Take 1 tablet (500 mg total) by mouth 2 (two) times daily for 7 days. Take with food  Dispense: 14 tablet; Refill: 0 - CULTURE, URINE COMPREHENSIVE   General Counseling: Sherry Evans verbalizes understanding of the findings of todays visit and agrees with plan of treatment. I have discussed any further diagnostic evaluation that may be needed or ordered today. We also reviewed her medications today. she has been encouraged to call the office with any questions or concerns that should arise related to todays visit.    Counseling:    Orders Placed This Encounter  Procedures   CULTURE, URINE COMPREHENSIVE    Meds ordered this encounter  Medications   ciprofloxacin  (CIPRO ) 500 MG tablet    Sig: Take 1 tablet (500 mg total) by mouth 2 (two) times daily for 7 days. Take with food    Dispense:  14 tablet    Refill:  0    Fill new script today.    Return if symptoms worsen or fail to improve.  [place] Controlled Substance Database was reviewed by me for overdose risk score (ORS)  Time spent:20 Minutes Time spent with patient included reviewing progress notes, labs, imaging studies, and discussing plan for follow up.   This patient was seen by Laurence Pons, FNP-C in collaboration with Dr. Verneta Gone as a part of collaborative care agreement.  Charnell Peplinski R. Bobbi Burow, MSN, FNP-C Internal Medicine

## 2024-03-02 ENCOUNTER — Encounter: Payer: Self-pay | Admitting: Nurse Practitioner

## 2024-03-02 LAB — CULTURE, URINE COMPREHENSIVE

## 2024-03-03 ENCOUNTER — Encounter: Payer: Self-pay | Admitting: Nurse Practitioner

## 2024-03-21 ENCOUNTER — Other Ambulatory Visit: Payer: Self-pay | Admitting: Nurse Practitioner

## 2024-03-21 DIAGNOSIS — Z0001 Encounter for general adult medical examination with abnormal findings: Secondary | ICD-10-CM

## 2024-03-21 DIAGNOSIS — Z79899 Other long term (current) drug therapy: Secondary | ICD-10-CM

## 2024-03-21 DIAGNOSIS — J309 Allergic rhinitis, unspecified: Secondary | ICD-10-CM

## 2024-03-27 ENCOUNTER — Other Ambulatory Visit: Payer: Self-pay

## 2024-03-27 DIAGNOSIS — G4709 Other insomnia: Secondary | ICD-10-CM

## 2024-03-27 MED ORDER — BELSOMRA 5 MG PO TABS: 15.0000 mg | ORAL_TABLET | Freq: Every evening | ORAL | 0 refills | Status: DC | PRN

## 2024-05-11 ENCOUNTER — Ambulatory Visit: Admitting: Nurse Practitioner

## 2024-05-11 ENCOUNTER — Encounter: Payer: Self-pay | Admitting: Nurse Practitioner

## 2024-05-11 VITALS — BP 120/72 | HR 88 | Temp 97.5°F | Resp 16 | Ht 65.0 in | Wt 228.4 lb

## 2024-05-11 DIAGNOSIS — K219 Gastro-esophageal reflux disease without esophagitis: Secondary | ICD-10-CM

## 2024-05-11 DIAGNOSIS — E039 Hypothyroidism, unspecified: Secondary | ICD-10-CM

## 2024-05-11 DIAGNOSIS — Z79899 Other long term (current) drug therapy: Secondary | ICD-10-CM | POA: Diagnosis not present

## 2024-05-11 DIAGNOSIS — G4709 Other insomnia: Secondary | ICD-10-CM

## 2024-05-11 DIAGNOSIS — F3342 Major depressive disorder, recurrent, in full remission: Secondary | ICD-10-CM

## 2024-05-11 MED ORDER — GABAPENTIN 600 MG PO TABS: ORAL_TABLET | ORAL | 1 refills | Status: DC

## 2024-05-11 MED ORDER — MELOXICAM 15 MG PO TABS: 15.0000 mg | ORAL_TABLET | Freq: Every day | ORAL | 1 refills | Status: DC

## 2024-05-11 MED ORDER — SERTRALINE HCL 50 MG PO TABS: ORAL_TABLET | ORAL | 2 refills | Status: AC

## 2024-05-11 MED ORDER — LEVOTHYROXINE SODIUM 100 MCG PO TABS
100.0000 ug | ORAL_TABLET | Freq: Every day | ORAL | 1 refills | Status: DC
Start: 2024-05-11 — End: 2024-08-03

## 2024-05-11 MED ORDER — BELSOMRA 15 MG PO TABS: 15.0000 mg | ORAL_TABLET | Freq: Every evening | ORAL | 2 refills | Status: DC | PRN

## 2024-05-11 MED ORDER — LINACLOTIDE 145 MCG PO CAPS: ORAL_CAPSULE | ORAL | 1 refills | Status: DC

## 2024-05-11 MED ORDER — NYSTATIN-TRIAMCINOLONE 100000-0.1 UNIT/GM-% EX OINT: TOPICAL_OINTMENT | Freq: Two times a day (BID) | CUTANEOUS | 3 refills | Status: AC | PRN

## 2024-05-11 MED ORDER — OMEPRAZOLE 40 MG PO CPDR: 40.0000 mg | DELAYED_RELEASE_CAPSULE | Freq: Two times a day (BID) | ORAL | 1 refills | Status: DC

## 2024-05-11 NOTE — Progress Notes (Signed)
 [facility] [address] [place], [place] [address]  Internal MEDICINE  Office Visit Note  Patient Name: Sherry Evans  [id number]  [id number]  Date of Service: 05/11/2024  Chief Complaint  Patient presents with   Depression   Gastroesophageal Reflux   Hyperlipidemia   Follow-up    HPI Sherry Evans presents for a follow-up visit for insomnia, GERD, depression, hypothyroidism and refills.  Insomnia -- was recently started on belsomra  and this is effective in helping her sleep.  GERD -- taking omeprazole  Depression -- taking sertraline  daily.  Hypothyroidism -- taking levothyroxine  daily with no issues.  Due for multiple other refills as well.     Current Medication: Outpatient Encounter Medications as of 05/11/2024  Medication Sig   budesonide (PULMICORT) 0.5 MG/2ML nebulizer solution Take 0.5 mg by nebulization 2 (two) times daily as needed. Nasal rinse (not used as nebulizer)   docusate sodium  (COLACE) 100 MG capsule Take 100 mg by mouth daily as needed for mild constipation.   gabapentin  (NEURONTIN ) 600 MG tablet TAKE 3 TABLETS BY MOUTH AT EVENING MEAL   levocetirizine (XYZAL ) 5 MG tablet TAKE 1 TABLET (5 MG TOTAL) BY MOUTH DAILY.   levothyroxine  (SYNTHROID ) 100 MCG tablet Take 1 tablet (100 mcg total) by mouth daily.   linaclotide  (LINZESS ) 145 MCG CAPS capsule TAKE 1 CAPSULE BY MOUTH DAILY BEFORE BREAKFAST.   Magnesium 400 MG CAPS Take 2 capsules by mouth daily.   meloxicam  (MOBIC ) 15 MG tablet Take 1 tablet (15 mg total) by mouth daily.   montelukast  (SINGULAIR ) 10 MG tablet TAKE 1 TABLET BY MOUTH DAILY FOR ALLERGIES   MULTIPLE VITAMIN PO Take 2 capsules by mouth daily.   nystatin -triamcinolone  ointment (MYCOLOG) Apply topically 2 (two) times daily as needed.   omeprazole  (PRILOSEC) 40 MG capsule Take 1 capsule (40 mg total) by mouth in the morning and at bedtime. May start with once daily and work up to twice daily.   sertraline  (ZOLOFT ) 50 MG tablet Take half  tab a day for one week and then increase to one tab a day   Suvorexant  (BELSOMRA ) 15 MG TABS Take 1 tablet (15 mg total) by mouth at bedtime as needed (insomnia).   TURMERIC-GINGER PO Take 2 tablets by mouth daily.   VITAMIN D -VITAMIN K PO Take 1 capsule by mouth daily.   vitamin E 1000 UNIT capsule Take 1,000 Units by mouth daily.   [DISCONTINUED] gabapentin  (NEURONTIN ) 600 MG tablet TAKE 3 TABLETS BY MOUTH AT EVENING MEAL   [DISCONTINUED] hydrocortisone  (ANUSOL -HC) 25 MG suppository Place 1 suppository (25 mg total) rectally 2 (two) times daily as needed for hemorrhoids or anal itching.   [DISCONTINUED] levothyroxine  (SYNTHROID ) 100 MCG tablet Take 1 tablet (100 mcg total) by mouth daily.   [DISCONTINUED] linaclotide  (LINZESS ) 145 MCG CAPS capsule TAKE 1 CAPSULE BY MOUTH DAILY BEFORE BREAKFAST.   [DISCONTINUED] meloxicam  (MOBIC ) 15 MG tablet Take 1 tablet (15 mg total) by mouth daily.   [DISCONTINUED] nystatin -triamcinolone  ointment (MYCOLOG) Apply topically 2 (two) times daily as needed.   [DISCONTINUED] omeprazole  (PRILOSEC) 40 MG capsule Take 1 capsule (40 mg total) by mouth in the morning and at bedtime. May start with once daily and work up to twice daily.   [DISCONTINUED] sertraline  (ZOLOFT ) 50 MG tablet Take half tab a day for one week and then increase to one tab a day   [DISCONTINUED] Suvorexant  (BELSOMRA ) 15 MG TABS Take 1 tablet (15 mg total) by mouth at bedtime as needed (insomnia).   [DISCONTINUED]  Suvorexant  (BELSOMRA ) 5 MG TABS Take 3 tablets (15 mg total) by mouth at bedtime as needed (insomnia).   [DISCONTINUED] traZODone  (DESYREL ) 50 MG tablet Take 1-2 tablets (50-100 mg total) by mouth at bedtime as needed for sleep. Take 2 tablets at bedtime   No facility-administered encounter medications on file as of 05/11/2024.    Surgical History: Past Surgical History:  Procedure Laterality Date   ADENOIDECTOMY  1969   BICEPT TENODESIS Right 04/02/2016   Procedure: BICEPS  TENODESIS;  Surgeon: Norleen JINNY Maltos, MD;  Location: [facility] ORS;  Service: Orthopedics;  Laterality: Right;   BLADDER SUSPENSION  2009   BREAST BIOPSY Left 1990's   benign   BREAST BIOPSY Left 10/28/2015   SMALL FRAGMENTS OF LYMPHOID TISSUE   CHOLECYSTECTOMY     COLONOSCOPY     DILATION AND CURETTAGE OF UTERUS     GASTRIC RESTRICTION SURGERY  08/23/2015   HIATAL HERNIA REPAIR  2016   JOINT REPLACEMENT Bilateral 2014   Partial Knee Replacement,  Surgeon: Dr. CECILE Glenn, [facility]   LUMBAR DISC SURGERY  2008   LUMBAR LAMINECTOMY/DECOMPRESSION MICRODISCECTOMY Right 02/24/2023   Procedure: RIGHT L5-S1 MICRODISCECTOMY;  Surgeon: Clois Fret, MD;  Location: [facility] ORS;  Service: Neurosurgery;  Laterality: Right;   SHOULDER ARTHROSCOPY WITH DEBRIDEMENT AND BICEP TENDON REPAIR Right 04/02/2016   Procedure: SHOULDER ARTHROSCOPY WITH DEBRIDEMENT ;  Surgeon: Norleen JINNY Maltos, MD;  Location: [facility] ORS;  Service: Orthopedics;  Laterality: Right;   SHOULDER ARTHROSCOPY WITH OPEN ROTATOR CUFF REPAIR Right 04/02/2016   Procedure: SHOULDER ARTHROSCOPY WITH OPEN ROTATOR CUFF REPAIR;  Surgeon: Norleen JINNY Maltos, MD;  Location: [facility] ORS;  Service: Orthopedics;  Laterality: Right;   SHOULDER ARTHROSCOPY WITH SUBACROMIAL DECOMPRESSION Right 04/02/2016   Procedure: SHOULDER ARTHROSCOPY WITH SUBACROMIAL DECOMPRESSION;  Surgeon: Norleen JINNY Maltos, MD;  Location: [facility] ORS;  Service: Orthopedics;  Laterality: Right;   TENDON RELEASE Bilateral 08/2021   thumbs    Medical History: Past Medical History:  Diagnosis Date   Abnormal EKG    Anxiety    DDD (degenerative disc disease), lumbar    Depression    GERD (gastroesophageal reflux disease)    History of sinus surgery    3 times   Hyperlipidemia    Hypothyroidism    Irritable bowel syndrome with constipation    Lumbar radiculitis    Obesity    Palpitations    Sleep apnea    not needed after weight loss surgery   Trigger finger     Family History: Family  History  Problem Relation Age of Onset   Hypertension Father    Diabetes Father    Breast cancer Maternal Aunt        great aunts. 70-80   Breast cancer Maternal Grandmother 74       metatastic to breast   Ovarian cancer Maternal Grandmother    Breast cancer Paternal Grandmother        110's   Colon cancer Neg Hx     Social History   Socioeconomic History   Marital status: Married    Spouse name: Rickard   Number of children: 1   Years of education: Not on file   Highest education level: Not on file  Occupational History   Not on file  Tobacco Use   Smoking status: Never   Smokeless tobacco: Never  Vaping Use   Vaping status: Never Used  Substance and Sexual Activity   Alcohol use: No    Alcohol/week: 0.0 standard  drinks of alcohol   Drug use: No   Sexual activity: Yes    Birth control/protection: Post-menopausal  Other Topics Concern   Not on file  Social History Narrative   Not on file   Social Drivers of Health   Financial Resource Strain: Not on file  Food Insecurity: Not on file  Transportation Needs: Not on file  Physical Activity: Inactive (12/08/2017)   Exercise Vital Sign    Days of Exercise per Week: 0 days    Minutes of Exercise per Session: 0 min  Stress: Not on file  Social Connections: Not on file  Intimate Partner Violence: Not on file      Review of Systems  Constitutional:  Negative for chills, fatigue and unexpected weight change.  HENT:  Negative for congestion, rhinorrhea, sneezing and sore throat.   Eyes:  Negative for redness.  Respiratory: Negative.  Negative for cough, chest tightness, shortness of breath and wheezing.   Cardiovascular: Negative.  Negative for chest pain and palpitations.  Gastrointestinal:  Negative for abdominal pain, constipation, diarrhea, nausea and vomiting.  Genitourinary:  Negative for dysuria and frequency.  Musculoskeletal:  Positive for arthralgias, back pain, joint swelling and myalgias. Negative for  neck pain.  Neurological: Negative.  Negative for tremors and numbness.  Hematological:  Negative for adenopathy. Does not bruise/bleed easily.  Psychiatric/Behavioral:  Negative for behavioral problems (Depression), sleep disturbance and suicidal ideas. The patient is not nervous/anxious.     Vital Signs: BP 120/72   Pulse 88   Temp (!) 97.5 F (36.4 C)   Resp 16   Ht 5' 5 (1.651 m)   Wt 228 lb 6.4 oz (103.6 kg)   LMP 12/21/2015 (Approximate)   SpO2 98%   BMI 38.01 kg/m    Physical Exam Vitals reviewed.  Constitutional:      General: She is not in acute distress.    Appearance: Normal appearance. She is obese. She is not ill-appearing.  HENT:     Head: Normocephalic and atraumatic.  Eyes:     Pupils: Pupils are equal, round, and reactive to light.  Cardiovascular:     Rate and Rhythm: Normal rate and regular rhythm.  Pulmonary:     Effort: Pulmonary effort is normal. No respiratory distress.  Neurological:     Mental Status: She is alert and oriented to person, place, and time.  Psychiatric:        Mood and Affect: Mood normal.        Behavior: Behavior normal.        Assessment/Plan: 1. Acquired hypothyroidism (Primary) Continue levothyroxine  as prescribed.  - levothyroxine  (SYNTHROID ) 100 MCG tablet; Take 1 tablet (100 mcg total) by mouth daily.  Dispense: 90 tablet; Refill: 1  2. Gastroesophageal reflux disease without esophagitis Continue omeprazole  as prescribed.  - omeprazole  (PRILOSEC) 40 MG capsule; Take 1 capsule (40 mg total) by mouth in the morning and at bedtime. May start with once daily and work up to twice daily.  Dispense: 180 capsule; Refill: 1  3. Other insomnia Continue belsomra  as prescribed.  - Suvorexant  (BELSOMRA ) 15 MG TABS; Take 1 tablet (15 mg total) by mouth at bedtime as needed (insomnia).  Dispense: 30 tablet; Refill: 2  4. Encounter for medication review Medication list reviewed, updated and refills ordered.  - gabapentin   (NEURONTIN ) 600 MG tablet; TAKE 3 TABLETS BY MOUTH AT EVENING MEAL  Dispense: 270 tablet; Refill: 1 - linaclotide  (LINZESS ) 145 MCG CAPS capsule; TAKE 1 CAPSULE BY MOUTH DAILY BEFORE  BREAKFAST.  Dispense: 90 capsule; Refill: 1 - meloxicam  (MOBIC ) 15 MG tablet; Take 1 tablet (15 mg total) by mouth daily.  Dispense: 90 tablet; Refill: 1 - nystatin -triamcinolone  ointment (MYCOLOG); Apply topically 2 (two) times daily as needed.  Dispense: 30 g; Refill: 3  5. Recurrent major depressive disorder, in full remission (HCC) Continue sertraline  as prescribed  - sertraline  (ZOLOFT ) 50 MG tablet; Take half tab a day for one week and then increase to one tab a day  Dispense: 90 tablet; Refill: 2   General Counseling: Sherry Evans verbalizes understanding of the findings of todays visit and agrees with plan of treatment. I have discussed any further diagnostic evaluation that may be needed or ordered today. We also reviewed her medications today. she has been encouraged to call the office with any questions or concerns that should arise related to todays visit.    No orders of the defined types were placed in this encounter.   Meds ordered this encounter  Medications   Suvorexant  (BELSOMRA ) 15 MG TABS    Sig: Take 1 tablet (15 mg total) by mouth at bedtime as needed (insomnia).    Dispense:  30 tablet    Refill:  2    For future refills   gabapentin  (NEURONTIN ) 600 MG tablet    Sig: TAKE 3 TABLETS BY MOUTH AT EVENING MEAL    Dispense:  270 tablet    Refill:  1   levothyroxine  (SYNTHROID ) 100 MCG tablet    Sig: Take 1 tablet (100 mcg total) by mouth daily.    Dispense:  90 tablet    Refill:  1    For future refills   linaclotide  (LINZESS ) 145 MCG CAPS capsule    Sig: TAKE 1 CAPSULE BY MOUTH DAILY BEFORE BREAKFAST.    Dispense:  90 capsule    Refill:  1    For future refills, do not send now, she just got some.   meloxicam  (MOBIC ) 15 MG tablet    Sig: Take 1 tablet (15 mg total) by mouth daily.     Dispense:  90 tablet    Refill:  1   nystatin -triamcinolone  ointment (MYCOLOG)    Sig: Apply topically 2 (two) times daily as needed.    Dispense:  30 g    Refill:  3   omeprazole  (PRILOSEC) 40 MG capsule    Sig: Take 1 capsule (40 mg total) by mouth in the morning and at bedtime. May start with once daily and work up to twice daily.    Dispense:  180 capsule    Refill:  1   sertraline  (ZOLOFT ) 50 MG tablet    Sig: Take half tab a day for one week and then increase to one tab a day    Dispense:  90 tablet    Refill:  2    Return in about 12 weeks (around 08/03/2024) for F/U, Sherry Evans PCP belsomra  refills .   Total time spent:30 Minutes Time spent includes review of chart, medications, test results, and follow up plan with the patient.   [place] Controlled Substance Database was reviewed by me.  This patient was seen by Sherry Maxin, FNP-C in collaboration with Dr. Sigrid Bathe as a part of collaborative care agreement.   Sherry Evans R. Maxin, MSN, FNP-C Internal medicine

## 2024-05-15 ENCOUNTER — Encounter: Payer: Self-pay | Admitting: Nurse Practitioner

## 2024-06-12 ENCOUNTER — Ambulatory Visit: Admitting: Nurse Practitioner

## 2024-06-13 ENCOUNTER — Encounter: Payer: Self-pay | Admitting: Nurse Practitioner

## 2024-06-30 ENCOUNTER — Telehealth: Admitting: Physician Assistant

## 2024-06-30 ENCOUNTER — Encounter: Payer: Self-pay | Admitting: Nurse Practitioner

## 2024-06-30 DIAGNOSIS — B9689 Other specified bacterial agents as the cause of diseases classified elsewhere: Secondary | ICD-10-CM

## 2024-06-30 DIAGNOSIS — J019 Acute sinusitis, unspecified: Secondary | ICD-10-CM

## 2024-06-30 MED ORDER — AMOXICILLIN-POT CLAVULANATE 875-125 MG PO TABS: 1.0000 | ORAL_TABLET | Freq: Two times a day (BID) | ORAL | 0 refills | Status: DC

## 2024-06-30 NOTE — Progress Notes (Signed)

## 2024-08-03 ENCOUNTER — Encounter: Payer: Self-pay | Admitting: Nurse Practitioner

## 2024-08-03 ENCOUNTER — Ambulatory Visit (INDEPENDENT_AMBULATORY_CARE_PROVIDER_SITE_OTHER): Admitting: Nurse Practitioner

## 2024-08-03 VITALS — BP 122/76 | HR 80 | Temp 96.7°F | Resp 16 | Ht 65.0 in | Wt 229.0 lb

## 2024-08-03 DIAGNOSIS — E039 Hypothyroidism, unspecified: Secondary | ICD-10-CM | POA: Diagnosis not present

## 2024-08-03 DIAGNOSIS — J309 Allergic rhinitis, unspecified: Secondary | ICD-10-CM

## 2024-08-03 DIAGNOSIS — G4709 Other insomnia: Secondary | ICD-10-CM | POA: Diagnosis not present

## 2024-08-03 DIAGNOSIS — E782 Mixed hyperlipidemia: Secondary | ICD-10-CM | POA: Diagnosis not present

## 2024-08-03 DIAGNOSIS — E538 Deficiency of other specified B group vitamins: Secondary | ICD-10-CM

## 2024-08-03 DIAGNOSIS — E559 Vitamin D deficiency, unspecified: Secondary | ICD-10-CM

## 2024-08-03 DIAGNOSIS — K219 Gastro-esophageal reflux disease without esophagitis: Secondary | ICD-10-CM

## 2024-08-03 DIAGNOSIS — Z79899 Other long term (current) drug therapy: Secondary | ICD-10-CM

## 2024-08-03 DIAGNOSIS — F331 Major depressive disorder, recurrent, moderate: Secondary | ICD-10-CM

## 2024-08-03 MED ORDER — OMEPRAZOLE 40 MG PO CPDR: 40.0000 mg | DELAYED_RELEASE_CAPSULE | Freq: Two times a day (BID) | ORAL | 1 refills | Status: AC

## 2024-08-03 MED ORDER — MELOXICAM 15 MG PO TABS: 15.0000 mg | ORAL_TABLET | Freq: Every day | ORAL | 1 refills | Status: AC

## 2024-08-03 MED ORDER — LEVOTHYROXINE SODIUM 100 MCG PO TABS
100.0000 ug | ORAL_TABLET | Freq: Every day | ORAL | 1 refills | Status: AC
Start: 2024-08-03 — End: ?

## 2024-08-03 MED ORDER — GABAPENTIN 600 MG PO TABS
ORAL_TABLET | ORAL | 1 refills | Status: AC
Start: 2024-08-03 — End: ?

## 2024-08-03 MED ORDER — BELSOMRA 15 MG PO TABS: 15.0000 mg | ORAL_TABLET | Freq: Every evening | ORAL | 2 refills | Status: AC | PRN

## 2024-08-03 MED ORDER — MONTELUKAST SODIUM 10 MG PO TABS: ORAL_TABLET | ORAL | 1 refills | Status: AC

## 2024-08-03 MED ORDER — LINACLOTIDE 145 MCG PO CAPS: ORAL_CAPSULE | ORAL | 1 refills | Status: AC

## 2024-08-03 MED ORDER — LEVOCETIRIZINE DIHYDROCHLORIDE 5 MG PO TABS: 5.0000 mg | ORAL_TABLET | Freq: Every day | ORAL | 1 refills | Status: AC

## 2024-08-03 NOTE — Progress Notes (Signed)
 [facility] [address] [place], [place] [address]  Internal MEDICINE  Office Visit Note  Patient Name: Sherry Evans  [id number]  [id number]  Date of Service: 08/03/2024  Chief Complaint  Patient presents with   Depression   Gastroesophageal Reflux   Hyperlipidemia   Follow-up    HPI Sherry Evans presents for a follow-up visit for GERD, insomna, hypothyroidism, recurrent depression, increased stress  GERD -- takes omeprazole  twice daily and tums as needed.  Insomnia --  belsomra  is working well, due for refills  Hypothyroidism -- taking levothyroxine  daily  Recurrent depression -- doing well on sertraline  100 mg daily.  Increased stress and has noticed intermittent heart palpitations -- check thyroid  level.     Current Medication: Outpatient Encounter Medications as of 08/03/2024  Medication Sig   budesonide (PULMICORT) 0.5 MG/2ML nebulizer solution Take 0.5 mg by nebulization 2 (two) times daily as needed. Nasal rinse (not used as nebulizer)   docusate sodium  (COLACE) 100 MG capsule Take 100 mg by mouth daily as needed for mild constipation.   gabapentin  (NEURONTIN ) 600 MG tablet TAKE 3 TABLETS BY MOUTH AT EVENING MEAL   levocetirizine (XYZAL ) 5 MG tablet Take 1 tablet (5 mg total) by mouth daily.   levothyroxine  (SYNTHROID ) 100 MCG tablet Take 1 tablet (100 mcg total) by mouth daily.   linaclotide  (LINZESS ) 145 MCG CAPS capsule TAKE 1 CAPSULE BY MOUTH DAILY BEFORE BREAKFAST.   Magnesium 400 MG CAPS Take 2 capsules by mouth daily.   meloxicam  (MOBIC ) 15 MG tablet Take 1 tablet (15 mg total) by mouth daily.   montelukast  (SINGULAIR ) 10 MG tablet TAKE 1 TABLET BY MOUTH DAILY FOR ALLERGIES   MULTIPLE VITAMIN PO Take 2 capsules by mouth daily.   nystatin -triamcinolone  ointment (MYCOLOG) Apply topically 2 (two) times daily as needed.   omeprazole  (PRILOSEC) 40 MG capsule Take 1 capsule (40 mg total) by mouth in the morning and at bedtime. May start with once daily and  work up to twice daily.   sertraline  (ZOLOFT ) 50 MG tablet Take half tab a day for one week and then increase to one tab a day   Suvorexant  (BELSOMRA ) 15 MG TABS Take 1 tablet (15 mg total) by mouth at bedtime as needed (insomnia).   TURMERIC-GINGER PO Take 2 tablets by mouth daily.   VITAMIN D -VITAMIN K PO Take 1 capsule by mouth daily.   [DISCONTINUED] amoxicillin -clavulanate (AUGMENTIN ) 875-125 MG tablet Take 1 tablet by mouth 2 (two) times daily. (Patient not taking: Reported on 08/03/2024)   [DISCONTINUED] gabapentin  (NEURONTIN ) 600 MG tablet TAKE 3 TABLETS BY MOUTH AT EVENING MEAL   [DISCONTINUED] levocetirizine (XYZAL ) 5 MG tablet TAKE 1 TABLET (5 MG TOTAL) BY MOUTH DAILY.   [DISCONTINUED] levothyroxine  (SYNTHROID ) 100 MCG tablet Take 1 tablet (100 mcg total) by mouth daily.   [DISCONTINUED] linaclotide  (LINZESS ) 145 MCG CAPS capsule TAKE 1 CAPSULE BY MOUTH DAILY BEFORE BREAKFAST.   [DISCONTINUED] meloxicam  (MOBIC ) 15 MG tablet Take 1 tablet (15 mg total) by mouth daily.   [DISCONTINUED] montelukast  (SINGULAIR ) 10 MG tablet TAKE 1 TABLET BY MOUTH DAILY FOR ALLERGIES   [DISCONTINUED] omeprazole  (PRILOSEC) 40 MG capsule Take 1 capsule (40 mg total) by mouth in the morning and at bedtime. May start with once daily and work up to twice daily.   [DISCONTINUED] Suvorexant  (BELSOMRA ) 15 MG TABS Take 1 tablet (15 mg total) by mouth at bedtime as needed (insomnia).   [DISCONTINUED] vitamin E 1000 UNIT capsule Take 1,000 Units by mouth daily. (  Patient not taking: Reported on 08/03/2024)   No facility-administered encounter medications on file as of 08/03/2024.    Surgical History: Past Surgical History:  Procedure Laterality Date   ADENOIDECTOMY  1969   BICEPT TENODESIS Right 04/02/2016   Procedure: BICEPS TENODESIS;  Surgeon: Norleen JINNY Maltos, MD;  Location: [facility] OR[facility];  Service: Orthopedics;  Laterality: Right;   BLADDER SUSPENSION  2009   BREAST BIOPSY Left 1990's   benign   BREAST BIOPSY Left  10/28/2015   SMALL FRAGMENTS OF LYMPHOID TISSUE   CHOLECYSTECTOMY     COLONOSCOPY     DILATION AND CURETTAGE OF UTERUS     GASTRIC RESTRICTION SURGERY  08/23/2015   HIATAL HERNIA REPAIR  2016   JOINT REPLACEMENT Bilateral 2014   Partial Knee Replacement,  Surgeon: Dr. CECILE Glenn, [facility]   LUMBAR DISC SURGERY  2008   LUMBAR LAMINECTOMY/DECOMPRESSION MICRODISCECTOMY Right 02/24/2023   Procedure: RIGHT L5-S1 MICRODISCECTOMY;  Surgeon: Clois Fret, MD;  Location: [facility] OR[facility];  Service: Neurosurgery;  Laterality: Right;   SHOULDER ARTHROSCOPY WITH DEBRIDEMENT AND BICEP TENDON REPAIR Right 04/02/2016   Procedure: SHOULDER ARTHROSCOPY WITH DEBRIDEMENT ;  Surgeon: Norleen JINNY Maltos, MD;  Location: [facility];  Service: Orthopedics;  Laterality: Right;   SHOULDER ARTHROSCOPY WITH OPEN ROTATOR CUFF REPAIR Right 04/02/2016   Procedure: SHOULDER ARTHROSCOPY WITH OPEN ROTATOR CUFF REPAIR;  Surgeon: Norleen JINNY Maltos, MD;  Location: [facility];  Service: Orthopedics;  Laterality: Right;   SHOULDER ARTHROSCOPY WITH SUBACROMIAL DECOMPRESSION Right 04/02/2016   Procedure: SHOULDER ARTHROSCOPY WITH SUBACROMIAL DECOMPRESSION;  Surgeon: Norleen JINNY Maltos, MD;  Location: [facility];  Service: Orthopedics;  Laterality: Right;   TENDON RELEASE Bilateral 08/2021   thumbs    Medical History: Past Medical History:  Diagnosis Date   Abnormal EKG    Anxiety    DDD (degenerative disc disease), lumbar    Depression    GERD (gastroesophageal reflux disease)    History of sinus surgery    3 times   Hyperlipidemia    Hypothyroidism    Irritable bowel syndrome with constipation    Lumbar radiculitis    Obesity    Palpitations    Sleep apnea    not needed after weight loss surgery   Trigger finger     Family History: Family History  Problem Relation Age of Onset   Hypertension Father    Diabetes Father    Breast cancer Maternal Aunt        great aunts. 70-80   Breast cancer Maternal Grandmother 41        metatastic to breast   Ovarian cancer Maternal Grandmother    Breast cancer Paternal Grandmother        65's   Colon cancer Neg Hx     Social History   Socioeconomic History   Marital status: Married    Spouse name: Rickard   Number of children: 1   Years of education: Not on file   Highest education level: Not on file  Occupational History   Not on file  Tobacco Use   Smoking status: Never   Smokeless tobacco: Never  Vaping Use   Vaping status: Never Used  Substance and Sexual Activity   Alcohol use: No    Alcohol/week: 0.0 standard drinks of alcohol   Drug use: No   Sexual activity: Yes    Birth control/protection: Post-menopausal  Other Topics Concern   Not on file  Social History Narrative   Not on file  Social Drivers of Corporate Investment Banker Strain: Not on file  Food Insecurity: Not on file  Transportation Needs: Not on file  Physical Activity: Inactive (12/08/2017)   Exercise Vital Sign    Days of Exercise per Week: 0 days    Minutes of Exercise per Session: 0 min  Stress: Not on file  Social Connections: Not on file  Intimate Partner Violence: Not on file      Review of Systems  Constitutional:  Negative for chills, fatigue and unexpected weight change.  HENT:  Negative for congestion, rhinorrhea, sneezing and sore throat.   Eyes:  Negative for redness.  Respiratory: Negative.  Negative for cough, chest tightness, shortness of breath and wheezing.   Cardiovascular:  Positive for palpitations (intermittent, has increased stress and anxiety, has been getting better lately.). Negative for chest pain.  Gastrointestinal:  Negative for abdominal pain, constipation, diarrhea, nausea and vomiting.  Genitourinary:  Negative for dysuria and frequency.  Musculoskeletal:  Positive for arthralgias, back pain, joint swelling and myalgias. Negative for neck pain.  Neurological: Negative.  Negative for tremors and numbness.  Hematological:  Negative for  adenopathy. Does not bruise/bleed easily.  Psychiatric/Behavioral:  Negative for behavioral problems (Depression), sleep disturbance and suicidal ideas. The patient is not nervous/anxious.     Vital Signs: BP 122/76   Pulse 80   Temp (!) 96.7 F (35.9 C)   Resp 16   Ht 5' 5 (1.651 m)   Wt 229 lb (103.9 kg)   LMP 12/21/2015 (Approximate)   SpO2 97%   BMI 38.11 kg/m    Physical Exam Vitals reviewed.  Constitutional:      General: She is not in acute distress.    Appearance: Normal appearance. She is obese. She is not ill-appearing.  HENT:     Head: Normocephalic and atraumatic.  Eyes:     Pupils: Pupils are equal, round, and reactive to light.  Cardiovascular:     Rate and Rhythm: Normal rate and regular rhythm.     Heart sounds: Normal heart sounds. No murmur heard. Pulmonary:     Effort: Pulmonary effort is normal. No respiratory distress.     Breath sounds: Normal breath sounds. No wheezing.  Neurological:     Mental Status: She is alert and oriented to person, place, and time.  Psychiatric:        Mood and Affect: Mood normal.        Behavior: Behavior normal.        Assessment/Plan: 1. Acquired hypothyroidism (Primary) Continue levothyroxine  as prescribed. Routine labs ordered  - TSH + free T4 - levothyroxine  (SYNTHROID ) 100 MCG tablet; Take 1 tablet (100 mcg total) by mouth daily.  Dispense: 90 tablet; Refill: 1 - CBC with Differential/Platelet - CMP14+EGFR - Lipid Profile - TSH + free T4 - B12 and Folate Panel - Vitamin D  (25 hydroxy)  2. Gastroesophageal reflux disease without esophagitis Continue omeprazole  as prescribed.  - omeprazole  (PRILOSEC) 40 MG capsule; Take 1 capsule (40 mg total) by mouth in the morning and at bedtime. May start with once daily and work up to twice daily.  Dispense: 180 capsule; Refill: 1  3. Mixed hyperlipidemia Routine labs ordered  - CBC with Differential/Platelet - CMP14+EGFR - Lipid Profile - TSH + free T4 - B12  and Folate Panel - Vitamin D  (25 hydroxy)  4. Other insomnia Continue belsomra  as prescribed, follow up in 3 months for refills.  - Suvorexant  (BELSOMRA ) 15 MG TABS; Take 1 tablet (15  mg total) by mouth at bedtime as needed (insomnia).  Dispense: 30 tablet; Refill: 2  5. Chronic allergic rhinitis Continue montelukast  as prescribed . - montelukast  (SINGULAIR ) 10 MG tablet; TAKE 1 TABLET BY MOUTH DAILY FOR ALLERGIES  Dispense: 90 tablet; Refill: 1  6. B12 deficiency Routine labs ordered  - CBC with Differential/Platelet - CMP14+EGFR - Lipid Profile - TSH + free T4 - B12 and Folate Panel - Vitamin D  (25 hydroxy)  7. Vitamin D  deficiency Routine labs ordered  - CBC with Differential/Platelet - CMP14+EGFR - Lipid Profile - TSH + free T4 - B12 and Folate Panel - Vitamin D  (25 hydroxy)  8. Encounter for medication review Medication list reviewed, updated and refills ordered.  - meloxicam  (MOBIC ) 15 MG tablet; Take 1 tablet (15 mg total) by mouth daily.  Dispense: 90 tablet; Refill: 1 - linaclotide  (LINZESS ) 145 MCG CAPS capsule; TAKE 1 CAPSULE BY MOUTH DAILY BEFORE BREAKFAST.  Dispense: 90 capsule; Refill: 1 - levocetirizine (XYZAL ) 5 MG tablet; Take 1 tablet (5 mg total) by mouth daily.  Dispense: 90 tablet; Refill: 1 - gabapentin  (NEURONTIN ) 600 MG tablet; TAKE 3 TABLETS BY MOUTH AT EVENING MEAL  Dispense: 270 tablet; Refill: 1  9. Moderate episode of recurrent major depressive disorder (HCC) Continue sertraline  as prescribed.    General Counseling: Sherry Evans verbalizes understanding of the findings of todays visit and agrees with plan of treatment. I have discussed any further diagnostic evaluation that may be needed or ordered today. We also reviewed her medications today. she has been encouraged to call the office with any questions or concerns that should arise related to todays visit.    Orders Placed This Encounter  Procedures   TSH + free T4   CBC with  Differential/Platelet   CMP14+EGFR   Lipid Profile   TSH + free T4   B12 and Folate Panel   Vitamin D  (25 hydroxy)    Meds ordered this encounter  Medications   Suvorexant  (BELSOMRA ) 15 MG TABS    Sig: Take 1 tablet (15 mg total) by mouth at bedtime as needed (insomnia).    Dispense:  30 tablet    Refill:  2    For future refills   montelukast  (SINGULAIR ) 10 MG tablet    Sig: TAKE 1 TABLET BY MOUTH DAILY FOR ALLERGIES    Dispense:  90 tablet    Refill:  1   meloxicam  (MOBIC ) 15 MG tablet    Sig: Take 1 tablet (15 mg total) by mouth daily.    Dispense:  90 tablet    Refill:  1   linaclotide  (LINZESS ) 145 MCG CAPS capsule    Sig: TAKE 1 CAPSULE BY MOUTH DAILY BEFORE BREAKFAST.    Dispense:  90 capsule    Refill:  1    For future refills, do not send now, she just got some.   levothyroxine  (SYNTHROID ) 100 MCG tablet    Sig: Take 1 tablet (100 mcg total) by mouth daily.    Dispense:  90 tablet    Refill:  1    For future refills   levocetirizine (XYZAL ) 5 MG tablet    Sig: Take 1 tablet (5 mg total) by mouth daily.    Dispense:  90 tablet    Refill:  1   gabapentin  (NEURONTIN ) 600 MG tablet    Sig: TAKE 3 TABLETS BY MOUTH AT EVENING MEAL    Dispense:  270 tablet    Refill:  1   omeprazole  (PRILOSEC) 40  MG capsule    Sig: Take 1 capsule (40 mg total) by mouth in the morning and at bedtime. May start with once daily and work up to twice daily.    Dispense:  180 capsule    Refill:  1    Return for previously scheduled, CPE, Sherry Evans PCP in march, have labs done before visit. .   Total time spent:30 Minutes Time spent includes review of chart, medications, test results, and follow up plan with the patient.   [place] Controlled Substance Database was reviewed by me.  This patient was seen by Mardy Maxin, FNP-C in collaboration with Dr. Sigrid Bathe as a part of collaborative care agreement.   Sherry Leidy R. Maxin, MSN, FNP-C Internal medicine

## 2024-08-07 ENCOUNTER — Encounter: Payer: Self-pay | Admitting: Nurse Practitioner

## 2024-08-11 LAB — TSH+FREE T4
Free T4: 1.18 ng/dL (ref 0.82–1.77)
TSH: 2.55 u[IU]/mL (ref 0.450–4.500)

## 2024-09-18 ENCOUNTER — Ambulatory Visit: Payer: Self-pay | Admitting: Nurse Practitioner

## 2024-11-29 ENCOUNTER — Ambulatory Visit: Admitting: Licensed Practical Nurse

## 2024-12-12 ENCOUNTER — Encounter: Admitting: Nurse Practitioner
